# Patient Record
Sex: Female | Born: 1955 | Race: White | Hispanic: No | Marital: Married | Smoking: Former smoker
Health system: Southern US, Community
[De-identification: ages and names within clinical notes are randomized; demographics above are authoritative.]

## PROBLEM LIST (undated history)

## (undated) DIAGNOSIS — F419 Anxiety disorder, unspecified: Secondary | ICD-10-CM

## (undated) DIAGNOSIS — F32A Depression, unspecified: Secondary | ICD-10-CM

## (undated) DIAGNOSIS — M810 Age-related osteoporosis without current pathological fracture: Secondary | ICD-10-CM

## (undated) DIAGNOSIS — H269 Unspecified cataract: Secondary | ICD-10-CM

## (undated) HISTORY — DX: Unspecified cataract: H26.9

## (undated) HISTORY — PX: COSMETIC SURGERY: SHX468

## (undated) HISTORY — DX: Anxiety disorder, unspecified: F41.9

## (undated) HISTORY — DX: Depression, unspecified: F32.A

---

## 1997-01-24 HISTORY — PX: EYE SURGERY: SHX253

## 2001-01-24 HISTORY — PX: AUGMENTATION MAMMAPLASTY: SUR837

## 2007-12-05 DIAGNOSIS — Z87898 Personal history of other specified conditions: Secondary | ICD-10-CM | POA: Insufficient documentation

## 2007-12-05 DIAGNOSIS — Z7289 Other problems related to lifestyle: Secondary | ICD-10-CM | POA: Insufficient documentation

## 2007-12-05 LAB — HM PAP SMEAR: HM Pap smear: NEGATIVE

## 2009-03-09 DIAGNOSIS — R42 Dizziness and giddiness: Secondary | ICD-10-CM | POA: Insufficient documentation

## 2009-07-10 DIAGNOSIS — R0789 Other chest pain: Secondary | ICD-10-CM | POA: Insufficient documentation

## 2011-04-20 ENCOUNTER — Ambulatory Visit: Payer: Self-pay

## 2013-07-12 ENCOUNTER — Ambulatory Visit: Payer: Self-pay | Admitting: Family Medicine

## 2014-06-10 LAB — LIPID PANEL
CHOLESTEROL: 193 mg/dL (ref 0–200)
HDL: 69 mg/dL (ref 35–70)
LDL Cholesterol: 113 mg/dL
Triglycerides: 54 mg/dL (ref 40–160)

## 2014-06-10 LAB — CBC AND DIFFERENTIAL
HCT: 45 % (ref 36–46)
Hemoglobin: 14.9 g/dL (ref 12.0–16.0)
PLATELETS: 193 10*3/uL (ref 150–399)

## 2014-06-10 LAB — BASIC METABOLIC PANEL
BUN: 13 mg/dL (ref 4–21)
CREATININE: 0.8 mg/dL (ref 0.5–1.1)
Glucose: 82 mg/dL
Potassium: 4.3 mmol/L (ref 3.4–5.3)
SODIUM: 142 mmol/L (ref 137–147)

## 2014-06-10 LAB — HEPATIC FUNCTION PANEL
ALT: 20 U/L (ref 7–35)
AST: 24 U/L (ref 13–35)

## 2014-06-10 LAB — TSH: TSH: 1.84 u[IU]/mL (ref 0.41–5.90)

## 2014-08-07 ENCOUNTER — Other Ambulatory Visit: Payer: Self-pay | Admitting: Family Medicine

## 2014-08-07 DIAGNOSIS — F419 Anxiety disorder, unspecified: Secondary | ICD-10-CM

## 2015-01-25 HISTORY — PX: EYE SURGERY: SHX253

## 2015-02-15 ENCOUNTER — Other Ambulatory Visit: Payer: Self-pay | Admitting: Family Medicine

## 2015-02-15 DIAGNOSIS — F419 Anxiety disorder, unspecified: Secondary | ICD-10-CM

## 2015-02-16 DIAGNOSIS — F419 Anxiety disorder, unspecified: Secondary | ICD-10-CM | POA: Insufficient documentation

## 2015-02-16 NOTE — Telephone Encounter (Signed)
Last OV 05/2014.  Dr. Santiago Bur pt.   Thanks,   -Vernona Rieger

## 2015-05-14 DIAGNOSIS — E7800 Pure hypercholesterolemia, unspecified: Secondary | ICD-10-CM | POA: Insufficient documentation

## 2015-05-14 DIAGNOSIS — R238 Other skin changes: Secondary | ICD-10-CM | POA: Insufficient documentation

## 2015-05-14 DIAGNOSIS — R6882 Decreased libido: Secondary | ICD-10-CM | POA: Insufficient documentation

## 2015-05-14 DIAGNOSIS — R233 Spontaneous ecchymoses: Secondary | ICD-10-CM | POA: Insufficient documentation

## 2015-05-14 DIAGNOSIS — J309 Allergic rhinitis, unspecified: Secondary | ICD-10-CM | POA: Insufficient documentation

## 2015-05-14 DIAGNOSIS — H6980 Other specified disorders of Eustachian tube, unspecified ear: Secondary | ICD-10-CM | POA: Insufficient documentation

## 2015-05-14 DIAGNOSIS — R030 Elevated blood-pressure reading, without diagnosis of hypertension: Secondary | ICD-10-CM | POA: Insufficient documentation

## 2015-05-18 ENCOUNTER — Ambulatory Visit (INDEPENDENT_AMBULATORY_CARE_PROVIDER_SITE_OTHER): Payer: 59 | Admitting: Family Medicine

## 2015-05-18 ENCOUNTER — Encounter: Payer: Self-pay | Admitting: Family Medicine

## 2015-05-18 VITALS — BP 130/78 | HR 76 | Temp 97.8°F | Resp 16 | Ht 68.0 in | Wt 131.0 lb

## 2015-05-18 DIAGNOSIS — F419 Anxiety disorder, unspecified: Secondary | ICD-10-CM | POA: Diagnosis not present

## 2015-05-18 MED ORDER — ESCITALOPRAM OXALATE 20 MG PO TABS: 20.0000 mg | ORAL_TABLET | Freq: Every day | ORAL | Status: DC

## 2015-05-18 NOTE — Progress Notes (Signed)
Subjective:    Patient ID: Amber Hayes, female    DOB: 08/28/1955, 60 y.o.   MRN: [id number]  Anxiety Presents for follow-up visit. The problem has been gradually worsening. Symptoms include depressed mood (is able to "talk myself out of it"), dry mouth, feeling of choking, malaise, muscle tension (and headaches), nervous/anxious behavior and panic (rare). Patient reports no chest pain, compulsions, confusion, decreased concentration, dizziness, excessive worry, hyperventilation, insomnia, irritability, nausea, obsessions, palpitations, restlessness, shortness of breath or suicidal ideas. The severity of symptoms is moderate. Nothing aggravates the symptoms.   Treatments tried: currently taking Celexa 40 mg po qd. The treatment provided moderate relief. Compliance with prior treatments has been good.   Depression screen PHQ 2/9 05/18/2015  Decreased Interest 1  Down, Depressed, Hopeless 1  PHQ - 2 Score 2  Altered sleeping 0  Tired, decreased energy 3  Change in appetite 0  Feeling bad or failure about yourself  2  Trouble concentrating 0  Moving slowly or fidgety/restless 0  Suicidal thoughts 0  PHQ-9 Score 7  Difficult doing work/chores Somewhat difficult     Review of Systems  Constitutional: Negative for irritability.  Respiratory: Negative for shortness of breath.   Cardiovascular: Negative for chest pain and palpitations.  Gastrointestinal: Negative for nausea.  Neurological: Negative for dizziness.  Psychiatric/Behavioral: Negative for suicidal ideas, confusion and decreased concentration. The patient is nervous/anxious. The patient does not have insomnia.    BP 130/78 mmHg  Pulse 76  Temp(Src) 97.8 F (36.6 C) (Oral)  Resp 16  Ht 5\' 8"  (1.727 m)  Wt 131 lb (59.421 kg)  BMI 19.92 kg/m2   Patient Active Problem List   Diagnosis Date Noted  . Allergic rhinitis 05/14/2015  . Blood pressure elevated without history of HTN 05/14/2015  . Decreased libido 05/14/2015    . Abnormal bruising 05/14/2015  . Dysfunction of eustachian tube 05/14/2015  . Pure hypercholesterolemia 05/14/2015  . Anxiety 02/16/2015  . Atypical chest pain 07/10/2009  . Dizziness and giddiness 03/09/2009  . Alcohol use (HCC) 12/05/2007   No past medical history on file. Current Outpatient Prescriptions on File Prior to Visit  Medication Sig  . citalopram (CELEXA) 40 MG tablet TAKE ONE TABLET BY MOUTH ONCE DAILY   No current facility-administered medications on file prior to visit.   No Known Allergies Past Surgical History  Procedure Laterality Date  . Augmentation mammaplasty Bilateral 2003  . Eye surgery  1999   Social History   Social History  . Marital Status: Married    Spouse Name: N/A  . Number of Children: N/A  . Years of Education: N/A   Occupational History  . Not on file.   Social History Main Topics  . Smoking status: Former Smoker    Quit date: 01/24/2004  . Smokeless tobacco: Never Used  . Alcohol Use: No  . Drug Use: No  . Sexual Activity: Not on file   Other Topics Concern  . Not on file   Social History Narrative   Family History  Problem Relation Age of Onset  . Alzheimer's disease Mother   . Heart attack Father        Objective:   Physical Exam  Constitutional: She is oriented to person, place, and time. She appears well-developed and well-nourished.  Neurological: She is alert and oriented to person, place, and time.  Psychiatric: She has a normal mood and affect. Her behavior is normal. Judgment and thought content normal.   BP 130/78 mmHg  Pulse 76  Temp(Src) 97.8 F (36.6 C) (Oral)  Resp 16  Ht  (1.727 m)  Wt 131 lb (59.421 kg)  BMI 19.92 kg/m2      Assessment & Plan:  1. Acute anxiety Patient still not at goal.  Still with anxiety and throat tightening when thinks about her boys. Will try change to Lexapro and referral to Dr. Maryruth Bun to evaluate and treat.   - escitalopram (LEXAPRO) 20 MG tablet; Take 1 tablet  (20 mg total) by mouth daily.  Dispense: 30 tablet; Refill: 5 - Ambulatory referral to Psychiatry   Patient was seen and examined by Leo Grosser, MD, and note scribed by Allene Dillon, CMA. I have reviewed the document for accuracy and completeness and I agree with above. Leo Grosser, MD   Lorie Phenix, MD

## 2016-05-13 ENCOUNTER — Ambulatory Visit: Payer: Self-pay | Admitting: Family Medicine

## 2016-07-04 ENCOUNTER — Ambulatory Visit (INDEPENDENT_AMBULATORY_CARE_PROVIDER_SITE_OTHER): Payer: Self-pay | Admitting: Physician Assistant

## 2016-07-04 ENCOUNTER — Encounter: Payer: Self-pay | Admitting: Physician Assistant

## 2016-07-04 VITALS — BP 132/84 | HR 80 | Temp 97.7°F | Resp 16 | Ht 67.0 in | Wt 129.0 lb

## 2016-07-04 DIAGNOSIS — Z0000 Encounter for general adult medical examination without abnormal findings: Secondary | ICD-10-CM

## 2016-07-04 DIAGNOSIS — Z1231 Encounter for screening mammogram for malignant neoplasm of breast: Secondary | ICD-10-CM

## 2016-07-04 DIAGNOSIS — Z1211 Encounter for screening for malignant neoplasm of colon: Secondary | ICD-10-CM

## 2016-07-04 DIAGNOSIS — Z1239 Encounter for other screening for malignant neoplasm of breast: Secondary | ICD-10-CM

## 2016-07-04 DIAGNOSIS — Z131 Encounter for screening for diabetes mellitus: Secondary | ICD-10-CM

## 2016-07-04 DIAGNOSIS — Z124 Encounter for screening for malignant neoplasm of cervix: Secondary | ICD-10-CM

## 2016-07-04 DIAGNOSIS — Z1159 Encounter for screening for other viral diseases: Secondary | ICD-10-CM

## 2016-07-04 DIAGNOSIS — F419 Anxiety disorder, unspecified: Secondary | ICD-10-CM

## 2016-07-04 DIAGNOSIS — E7800 Pure hypercholesterolemia, unspecified: Secondary | ICD-10-CM

## 2016-07-04 NOTE — Patient Instructions (Signed)

## 2016-07-04 NOTE — Progress Notes (Signed)
Patient: Amber Hayes, Female    DOB: 05/14/1955, [age] y.o.   MRN: [id number] Visit Date: 07/04/2016  Today's Provider: Margaretann LovelessJennifer M Rozlyn Yerby, PA-C   Chief Complaint  Patient presents with  . Annual Exam   Subjective:    Annual physical exam Amber Hayes is a 61 y.o. female who presents today for health maintenance and complete physical. She feels well. She reports exercising daily for one hour; goes to gym, rides recumbent bike, etc. She reports she is sleeping poorly.  Last CPE- 06/09/2016 Last pap- 12/05/2007- Negative, HPV negative Last mammogram- 04/20/2011- BI-RADS 2 Last flexible sigmoidoscopy 09/24/2014- 10 years per pt. Internal hemorrhoids per pt. ----------------------------------------------------------------- Anxiety Dr. Maryruth BunKapur D/C pt's SSRI about 7 weeks ago. Pt reports none of the antidepressants she has tried were effective. Pt is exercising, deep breathing, and other remedies for anxiety. She states she is learning to cope with the anxiety. She states, "I don't think there is any pill that can magically take my anxiety away". Pt is c/o sleep disturbance, which is gradually improving. She does not want medication for insomnia, unless there is a natural remedy. She states her appetite is also increased. She denies weight gain. Pt states her anxiety causes a globus sensation.  Review of Systems  Constitutional: Positive for appetite change. Negative for activity change, chills, diaphoresis, fatigue, fever and unexpected weight change.  HENT: Positive for tinnitus. Negative for congestion, dental problem, drooling, ear discharge, ear pain, facial swelling, hearing loss, mouth sores, nosebleeds, postnasal drip, rhinorrhea, sinus pain, sinus pressure, sneezing, sore throat, trouble swallowing and voice change.   Eyes: Positive for visual disturbance. Negative for photophobia, pain, discharge, redness and itching.  Respiratory: Negative.   Cardiovascular: Negative.     Gastrointestinal: Negative.   Endocrine: Positive for polyphagia. Negative for cold intolerance, heat intolerance, polydipsia and polyuria.  Genitourinary: Negative.   Musculoskeletal: Positive for neck pain. Negative for arthralgias, back pain, gait problem, joint swelling, myalgias and neck stiffness.  Allergic/Immunologic: Negative.   Neurological: Positive for headaches. Negative for dizziness, tremors, seizures, syncope, facial asymmetry, speech difficulty, weakness, light-headedness and numbness.  Hematological: Negative for adenopathy. Bruises/bleeds easily.  Psychiatric/Behavioral: Positive for sleep disturbance. Negative for agitation, behavioral problems, confusion, decreased concentration, dysphoric mood, hallucinations, self-injury and suicidal ideas. The patient is nervous/anxious. The patient is not hyperactive.     Social History      She  reports that she quit smoking about 12 years ago. She has a 7.50 pack-year smoking history. She has never used smokeless tobacco. She reports that she does not drink alcohol or use drugs.       Social History   Social History  . Marital status: Married    Spouse name: Thayer OhmChris  . Number of children: 2  . Years of education: undergrad   Occupational History  . 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 Smoking status: Former Smoker    Packs/day: 0.50    Years: 15.00    Quit date: 01/24/2004  . Smokeless tobacco: Never Used  . Alcohol use No  . Drug use: No  . Sexual activity: Yes    Birth control/ protection: Post-menopausal   Other Topics Concern  . None   Social History Narrative  . None    History reviewed. No pertinent past medical history.   Patient Active Problem List   Diagnosis Date Noted  . Allergic rhinitis 05/14/2015  .  Blood pressure elevated without history of HTN 05/14/2015  . Decreased libido 05/14/2015  . Abnormal bruising  05/14/2015  . Dysfunction of eustachian tube 05/14/2015  . Pure hypercholesterolemia 05/14/2015  . Anxiety 02/16/2015  . Atypical chest pain 07/10/2009  . Alcohol use 12/05/2007    Past Surgical History:  Procedure Laterality Date  . AUGMENTATION MAMMAPLASTY Bilateral 2003  . EYE SURGERY  1999   Lasik  . EYE SURGERY  2017   Torn Retina    Family History        Family Status  Relation Status  . Mother Deceased at age 25  . Father Deceased at age 39       MI  . Sister Alive        Her family history includes Alzheimer's disease in her mother; Bronchitis in her sister; Heart attack in her father; Hypertension in her sister; Neurologic Disorder in her sister.     No Known Allergies   Current Outpatient Prescriptions:  .  Probiotic Product (PROBIOTIC DAILY PO), Take by mouth., Disp: , Rfl:  .  Protein POWD, Take by mouth., Disp: , Rfl:  .  UNABLE TO FIND, Med Name: OTC supplement that improves red blood cells ability to carry oxygen, Disp: , Rfl:    Patient Care Team: Lorie Phenix, MD as PCP - General (Family Medicine)      Objective:   Vitals: BP 132/84 (BP Location: Right Arm, Cuff Size: Normal)   Pulse 80   Temp 97.7 F (36.5 C) (Oral)   Resp 16   Ht 5\' 7"  (1.702 m)   Wt 129 lb (58.5 kg)   BMI 20.20 kg/m    Vitals:   07/04/16 1510 07/04/16 1532  BP: 126/88 132/84  Pulse: 80   Resp: 16   Temp: 97.7 F (36.5 C)   TempSrc: Oral   Weight: 129 lb (58.5 kg)   Height: 5\' 7"  (1.702 m)      Physical Exam  Constitutional: She is oriented to person, place, and time. She appears well-developed and well-nourished. No distress.  HENT:  Head: Normocephalic and atraumatic.  Right Ear: Hearing, tympanic membrane, external ear and ear canal normal.  Left Ear: Hearing, tympanic membrane, external ear and ear canal normal.  Nose: Nose normal.  Mouth/Throat: Uvula is midline, oropharynx is clear and moist and mucous membranes are normal. No oropharyngeal exudate.    Eyes: Conjunctivae and EOM are normal. Pupils are equal, round, and reactive to light. Right eye exhibits no discharge. Left eye exhibits no discharge. No scleral icterus.  Neck: Normal range of motion. Neck supple. No JVD present. Carotid bruit is not present. No tracheal deviation present. No thyromegaly present.  Cardiovascular: Normal rate, regular rhythm, normal heart sounds and intact distal pulses.  Exam reveals no gallop and no friction rub.   No murmur heard. Pulmonary/Chest: Effort normal and breath sounds normal. No respiratory distress. She has no wheezes. She has no rales. She exhibits no tenderness. Right breast exhibits no inverted nipple, no mass, no nipple discharge, no skin change and no tenderness. Left breast exhibits no inverted nipple, no mass, no nipple discharge, no skin change and no tenderness. Breasts are symmetrical.  Abdominal: Soft. Bowel sounds are normal. She exhibits no distension and no mass. There is no tenderness. There is no rebound and no guarding. Hernia confirmed negative in the right inguinal area and confirmed negative in the left inguinal area.  Genitourinary: Rectum normal, vagina normal and uterus normal. No breast swelling, tenderness,  discharge or bleeding. Pelvic exam was performed with patient supine. There is no rash, tenderness, lesion or injury on the right labia. There is no rash, tenderness, lesion or injury on the left labia. Cervix exhibits no motion tenderness, no discharge and no friability. Right adnexum displays no mass, no tenderness and no fullness. Left adnexum displays no mass, no tenderness and no fullness. No erythema, tenderness or bleeding in the vagina. No signs of injury around the vagina. No vaginal discharge found.  Musculoskeletal: Normal range of motion. She exhibits no edema or tenderness.  Lymphadenopathy:    She has no cervical adenopathy.       Right: No inguinal adenopathy present.       Left: No inguinal adenopathy present.   Neurological: She is alert and oriented to person, place, and time. She has normal reflexes. No cranial nerve deficit. Coordination normal.  Skin: Skin is warm and dry. No rash noted. She is not diaphoretic.  Psychiatric: She has a normal mood and affect. Her behavior is normal. Judgment and thought content normal.  Vitals reviewed.    Depression Screen PHQ 2/9 Scores 07/04/2016 05/18/2015  PHQ - 2 Score 0 2  PHQ- 9 Score 3 7      Assessment & Plan:     Routine Health Maintenance and Physical Exam  Exercise Activities and Dietary recommendations Goals    None      Immunization History  Administered Date(s) Administered  . Td 07/13/2013  . Tdap 07/13/2013    Health Maintenance  Topic Date Due  . Hepatitis C Screening  [birth year]  . HIV Screening  07/30/1968  . PAP SMEAR  07/31/1974  . MAMMOGRAM  07/31/2003  . COLONOSCOPY  07/31/2003  . INFLUENZA VACCINE  08/24/2016  . TETANUS/TDAP  07/14/2023     Discussed health benefits of physical activity, and encouraged her to engage in regular exercise appropriate for her age and condition.    1. Annual physical exam Normal physical exam today. Will check labs as below and f/u pending lab results. If labs are stable and WNL she will not need to have these rechecked for one year at her next annual physical exam. She is to call the office in the meantime if she has any acute issue, questions or concerns. - CBC with Differential - Comprehensive metabolic panel - TSH  2. Breast cancer screening Breast exam was normal. Patient does perform self breast exams. Patient does not want to have mammogram at this time.  3. Cervical cancer screening Pap collected today. Will send as below and f/u pending results. - Pap IG and HPV (high risk) DNA detection ([facility])  4. Colon cancer screening Referral placed to gastroenterology for colonoscopy.  - Ambulatory referral to Gastroenterology  5. Pure  hypercholesterolemia Stable. Will check labs as below and f/u pending results. - Lipid panel  6. Diabetes mellitus screening Will check labs as below and f/u pending results. - Hemoglobin A1c  7. Need for hepatitis C screening test - Hepatitis C antibody screen  8. Anxiety Patient feels she is doing well with self coping techniques. She is requesting to have cortisol checked to make sure this is not a source for her anxiety. I will f/u pending results.  - Cortisol  --------------------------------------------------------------------    Margaretann Loveless, PA-C  [facility] [place]

## 2016-07-06 ENCOUNTER — Telehealth: Payer: Self-pay

## 2016-07-06 LAB — CBC WITH DIFFERENTIAL/PLATELET
BASOS: 1 %
Basophils Absolute: 0 10*3/uL (ref 0.0–0.2)
EOS (ABSOLUTE): 0 10*3/uL (ref 0.0–0.4)
EOS: 1 %
HEMATOCRIT: 37.2 % (ref 34.0–46.6)
HEMOGLOBIN: 11.2 g/dL (ref 11.1–15.9)
Immature Grans (Abs): 0 10*3/uL (ref 0.0–0.1)
Immature Granulocytes: 0 %
LYMPHS ABS: 1.4 10*3/uL (ref 0.7–3.1)
Lymphs: 31 %
MCH: 22.4 pg — AB (ref 26.6–33.0)
MCHC: 30.1 g/dL — AB (ref 31.5–35.7)
MCV: 74 fL — AB (ref 79–97)
MONOCYTES: 7 %
MONOS ABS: 0.3 10*3/uL (ref 0.1–0.9)
NEUTROS ABS: 2.6 10*3/uL (ref 1.4–7.0)
Neutrophils: 60 %
Platelets: 235 10*3/uL (ref 150–379)
RBC: 5.01 x10E6/uL (ref 3.77–5.28)
RDW: 18.1 % — AB (ref 12.3–15.4)
WBC: 4.4 10*3/uL (ref 3.4–10.8)

## 2016-07-06 LAB — COMPREHENSIVE METABOLIC PANEL
A/G RATIO: 1.9 (ref 1.2–2.2)
ALBUMIN: 4.3 g/dL (ref 3.6–4.8)
ALK PHOS: 88 IU/L (ref 39–117)
ALT: 14 IU/L (ref 0–32)
AST: 19 IU/L (ref 0–40)
BUN / CREAT RATIO: 22 (ref 12–28)
BUN: 18 mg/dL (ref 8–27)
Bilirubin Total: 0.5 mg/dL (ref 0.0–1.2)
CO2: 24 mmol/L (ref 20–29)
CREATININE: 0.81 mg/dL (ref 0.57–1.00)
Calcium: 9.2 mg/dL (ref 8.7–10.3)
Chloride: 105 mmol/L (ref 96–106)
GFR calc Af Amer: 90 mL/min/{1.73_m2} (ref >59)
GFR, EST NON AFRICAN AMERICAN: 78 mL/min/{1.73_m2} (ref >59)
GLOBULIN, TOTAL: 2.3 g/dL (ref 1.5–4.5)
Glucose: 80 mg/dL (ref 65–99)
POTASSIUM: 4.4 mmol/L (ref 3.5–5.2)
SODIUM: 141 mmol/L (ref 134–144)
Total Protein: 6.6 g/dL (ref 6.0–8.5)

## 2016-07-06 LAB — TSH: TSH: 2.19 u[IU]/mL (ref 0.450–4.500)

## 2016-07-06 LAB — HEMOGLOBIN A1C
ESTIMATED AVERAGE GLUCOSE: 108 mg/dL
HEMOGLOBIN A1C: 5.4 % (ref 4.8–5.6)

## 2016-07-06 LAB — LIPID PANEL
CHOL/HDL RATIO: 2.6 ratio (ref 0.0–4.4)
Cholesterol, Total: 211 mg/dL — ABNORMAL HIGH (ref 100–199)
HDL: 80 mg/dL (ref >39)
LDL Calculated: 121 mg/dL — ABNORMAL HIGH (ref 0–99)
Triglycerides: 48 mg/dL (ref 0–149)
VLDL CHOLESTEROL CAL: 10 mg/dL (ref 5–40)

## 2016-07-06 LAB — HEPATITIS C ANTIBODY

## 2016-07-06 LAB — PAP IG AND HPV HIGH-RISK
HPV, HIGH-RISK: NEGATIVE
PAP Smear Comment: 0

## 2016-07-06 LAB — CORTISOL: Cortisol: 14.8 ug/dL

## 2016-07-06 NOTE — Telephone Encounter (Signed)
-----   Message from Margaretann LovelessJennifer M Burnette, [place]PA-C sent at 07/06/2016 12:57 PM EDT ----- Pap is also negative and HPV negative. Can repeat in 3-5 years if patient desires.

## 2016-07-06 NOTE — Telephone Encounter (Signed)
Patient advised as below.  

## 2016-11-11 ENCOUNTER — Encounter: Payer: Self-pay | Admitting: Adult Health

## 2016-11-11 ENCOUNTER — Ambulatory Visit: Payer: Self-pay | Admitting: Adult Health

## 2016-11-11 VITALS — BP 130/78 | HR 72 | Temp 97.5°F | Resp 16 | Wt 128.0 lb

## 2016-11-11 DIAGNOSIS — R3989 Other symptoms and signs involving the genitourinary system: Secondary | ICD-10-CM

## 2016-11-11 DIAGNOSIS — R350 Frequency of micturition: Secondary | ICD-10-CM

## 2016-11-11 LAB — POCT URINALYSIS DIPSTICK
Bilirubin, UA: NEGATIVE
GLUCOSE UA: NEGATIVE
KETONES UA: NEGATIVE
Leukocytes, UA: NEGATIVE
Nitrite, UA: NEGATIVE
Protein, UA: NEGATIVE
RBC UA: NEGATIVE
SPEC GRAV UA: 1.01 (ref 1.010–1.025)
Urobilinogen, UA: 0.2 E.U./dL
pH, UA: 6 (ref 5.0–8.0)

## 2016-11-11 MED ORDER — SULFAMETHOXAZOLE-TRIMETHOPRIM 400-80 MG PO TABS: 1.0000 | ORAL_TABLET | Freq: Two times a day (BID) | ORAL | 0 refills | Status: DC

## 2016-11-11 NOTE — Progress Notes (Addendum)
Subjective:     Patient ID: Amber Hayes, female   DOB: [birth year], 61 y.o.   MRN: [id number]  HPI  Patient is a 61 year old female in no acute distress who reports urinary frequency and pressure with urinating since 11/07/16.  She also reports she has started a miracle tea in the past two weeks.  She has done this detox tea.  This tea has a mild laxative, Persimmon  leaves, marshmallow root, Holy Thistle and Blessed thistle leafs and chamomile- she thinks possibly this could be causing her symptoms.   Mild fatigue she reports for last two days.  She also reports new sleep pattern as well that could be affecting- she is getting up at 4am.  Normal stools though she reports they are lose at times with probiotics and teas.   Menopausal " for years"   Vitals:   11/11/16 0753  BP: 130/78  Pulse: 72  Resp: 16  Temp: (!) 97.5 F (36.4 C)  SpO2: 96%   Recheck 98.0 oral 99 % Oxygen saturation recheck   No Known Allergies   Current Outpatient Prescriptions:  .  Probiotic Product (PROBIOTIC DAILY PO), Take by mouth., Disp: , Rfl:  .  Protein POWD, Take by mouth., Disp: , Rfl:  .  UNABLE TO FIND, Med Name: OTC supplement that improves red blood cells ability to carry oxygen, Disp: , Rfl:   Burnette, Alessandra Bevels, PA-C PCP she has annual female exam- she reports PAP/GYN summer was normal this year. Blood work was normal per patient.    Review of Systems  Constitutional: Positive for fatigue. Negative for activity change, appetite change, chills, diaphoresis, fever and unexpected weight change.  HENT: Negative.   Eyes: Negative.   Respiratory: Negative for apnea, cough, choking, chest tightness, shortness of breath, wheezing and stridor.   Cardiovascular: Negative for chest pain, palpitations and leg swelling.  Gastrointestinal: Positive for abdominal distention (mild chronic for years  per her report ). Negative for abdominal pain, anal bleeding, blood in stool, constipation, diarrhea,  nausea, rectal pain and vomiting.  Endocrine: Negative.   Genitourinary: Positive for urgency. Negative for decreased urine volume, difficulty urinating, dyspareunia, dysuria, enuresis, flank pain, frequency, genital sores, hematuria, menstrual problem, pelvic pain, vaginal bleeding, vaginal discharge and vaginal pain.  Musculoskeletal: Positive for back pain (lower back aches last two days she has also been new back sitting to desk and started elons  excercise classes ). Negative for arthralgias, gait problem, joint swelling, myalgias and neck pain.  Skin: Negative.   Allergic/Immunologic: Negative.   Neurological: Negative.   Hematological: Negative.   Psychiatric/Behavioral: Negative.        Objective:   Physical Exam  Constitutional: She is oriented to person, place, and time. She appears well-developed and well-nourished. No distress. She is not intubated.  HENT:  Head: Normocephalic.  Mouth/Throat: Oropharynx is clear and moist. No oropharyngeal exudate.  Eyes: Pupils are equal, round, and reactive to light. Conjunctivae and EOM are normal.  Neck: Normal range of motion. No JVD present. No tracheal deviation present. No thyromegaly present.  Cardiovascular: Normal rate, regular rhythm, normal heart sounds and intact distal pulses.  Exam reveals no gallop and no friction rub.   No murmur heard. Pulmonary/Chest: Effort normal and breath sounds normal. No stridor. No apnea, no tachypnea and no bradypnea. She is not intubated. No respiratory distress. She has no wheezes. She has no rales. She exhibits no tenderness.  Abdominal: Soft. Normal aorta and bowel sounds are normal. She  exhibits no shifting dullness, no distension, no pulsatile liver, no fluid wave, no abdominal bruit, no ascites, no pulsatile midline mass and no mass. There is no hepatosplenomegaly, splenomegaly or hepatomegaly. There is tenderness in the suprapubic area. There is no rigidity, no rebound, no guarding, no CVA  tenderness, no tenderness at McBurney's point and negative Murphy's sign.    Area of mild tenderness documented on diagram suprapubic. Otherwise normal abdominal exam.   Genitourinary:  Genitourinary Comments: No gynecology exams done in this office at this time/ patient is aware she will have to see gynecology if needed.   Musculoskeletal: Normal range of motion. She exhibits no edema, tenderness or deformity.  Lymphadenopathy:    She has no cervical adenopathy.  Neurological: She is alert and oriented to person, place, and time. She has normal reflexes.  Patient moves on and off of exam table and in room without difficulty. Gait is normal in hall and in room. Patient is oriented to person place time and situation. Patient answers questions appropriately and engages in conversation..   Skin: Skin is warm and dry. No rash noted. She is not diaphoretic. No erythema. No pallor.  Psychiatric: She has a normal mood and affect. Her speech is normal and behavior is normal. Judgment and thought content normal. Cognition and memory are normal.  Vitals reviewed.      Assessment:     Urine troubles - Plan: POCT urinalysis dipstick, CBC w/Diff, Comprehensive metabolic panel, CULTURE, URINE COMPREHENSIVE  Urinary frequency  Will treat for possible Urinary Tract Infection due to symptoms. Will culture and call with results.  Also discussed possibility of tea causing urgency.     Plan:   Meds ordered this encounter  Medications  . sulfamethoxazole-trimethoprim (BACTRIM) 400-80 MG tablet    Sig: Take 1 tablet by mouth 2 (two) times daily.    Dispense:  14 tablet    Refill:  0  E- Prescribed above.   Orders Placed This Encounter  Procedures  . CULTURE, URINE COMPREHENSIVE  . CBC w/Diff  . Comprehensive metabolic panel  . POCT urinalysis dipstick   Will treat due to symptoms and culture urine.  Also discussed the possibility that this new tea could be causing symptoms and stopping it,  Marshmallow root  Has the potential to act as a diuretic. Other herbs in tea also have potential for laxative properties. Advised these herbs have not been approved by the FDA and not controlled studies to recommend use.  If any pelvic tenderness occurs or worsens she is advised to see Margaretann LovelessBurnette, Jennifer M, PA-C  PCP for pelvic exam and evaluation.   Return to clinic at any time  if any new symptoms change, worsen or do not improve. Symptoms should improve  within 72 hours and if not improving you should call for an appointment at the clinic or be seen in urgent care/ED if clinic is closed. Your symptoms should not get worse from this point forward and if they do seek immediate medical attention.  Patient verbalized understanding of instructions and denies any further questions at this time.

## 2016-11-11 NOTE — Patient Instructions (Signed)

## 2016-11-12 LAB — CBC WITH DIFFERENTIAL/PLATELET
Basophils Absolute: 0.1 10*3/uL (ref 0.0–0.2)
Basos: 1 %
EOS (ABSOLUTE): 0 10*3/uL (ref 0.0–0.4)
Eos: 1 %
Hematocrit: 41.2 % (ref 34.0–46.6)
Hemoglobin: 13.1 g/dL (ref 11.1–15.9)
IMMATURE GRANS (ABS): 0 10*3/uL (ref 0.0–0.1)
Immature Granulocytes: 0 %
LYMPHS: 21 %
Lymphocytes Absolute: 1.1 10*3/uL (ref 0.7–3.1)
MCH: 26.3 pg — AB (ref 26.6–33.0)
MCHC: 31.8 g/dL (ref 31.5–35.7)
MCV: 83 fL (ref 79–97)
Monocytes Absolute: 0.5 10*3/uL (ref 0.1–0.9)
Monocytes: 10 %
NEUTROS ABS: 3.5 10*3/uL (ref 1.4–7.0)
Neutrophils: 67 %
Platelets: 199 10*3/uL (ref 150–379)
RBC: 4.98 x10E6/uL (ref 3.77–5.28)
RDW: 17 % — ABNORMAL HIGH (ref 12.3–15.4)
WBC: 5.2 10*3/uL (ref 3.4–10.8)

## 2016-11-12 LAB — COMPREHENSIVE METABOLIC PANEL
ALK PHOS: 92 IU/L (ref 39–117)
ALT: 15 IU/L (ref 0–32)
AST: 25 IU/L (ref 0–40)
Albumin/Globulin Ratio: 1.9 (ref 1.2–2.2)
Albumin: 4.2 g/dL (ref 3.6–4.8)
BILIRUBIN TOTAL: 0.4 mg/dL (ref 0.0–1.2)
BUN/Creatinine Ratio: 12 (ref 12–28)
BUN: 9 mg/dL (ref 8–27)
CHLORIDE: 102 mmol/L (ref 96–106)
CO2: 25 mmol/L (ref 20–29)
Calcium: 9.1 mg/dL (ref 8.7–10.3)
Creatinine, Ser: 0.75 mg/dL (ref 0.57–1.00)
GFR calc Af Amer: 98 mL/min/{1.73_m2} (ref >59)
GFR calc non Af Amer: 85 mL/min/{1.73_m2} (ref >59)
GLUCOSE: 76 mg/dL (ref 65–99)
Globulin, Total: 2.2 g/dL (ref 1.5–4.5)
Potassium: 3.7 mmol/L (ref 3.5–5.2)
Sodium: 142 mmol/L (ref 134–144)
TOTAL PROTEIN: 6.4 g/dL (ref 6.0–8.5)

## 2016-11-14 LAB — CULTURE, URINE COMPREHENSIVE

## 2016-11-17 ENCOUNTER — Telehealth: Payer: Self-pay | Admitting: Adult Health

## 2016-11-17 NOTE — Telephone Encounter (Signed)
Recent Results (from the past 2160 hour(s))  POCT urinalysis dipstick     Status: Normal   Collection Time: 11/11/16  8:14 AM  Result Value Ref Range   Color, UA yellow    Clarity, UA clear    Glucose, UA neg    Bilirubin, UA neg    Ketones, UA neg    Spec Grav, UA 1.010 1.010 - 1.025   Blood, UA neg    pH, UA 6.0 5.0 - 8.0   Protein, UA neg    Urobilinogen, UA 0.2 0.2 or 1.0 E.U./dL   Nitrite, UA neg    Leukocytes, UA Negative Negative  CBC w/Diff     Status: Abnormal   Collection Time: 11/11/16  8:58 AM  Result Value Ref Range   WBC 5.2 3.4 - 10.8 x10E3/uL   RBC 4.98 3.77 - 5.28 x10E6/uL   Hemoglobin 13.1 11.1 - 15.9 g/dL   Hematocrit 08.6 57.8 - 46.6 %   MCV 83 79 - 97 fL   MCH 26.3 (L) 26.6 - 33.0 pg   MCHC 31.8 31.5 - 35.7 g/dL   RDW 46.9 (H) 62.9 - 52.8 %   Platelets 199 150 - 379 x10E3/uL   Neutrophils 67 Not Estab. %   Lymphs 21 Not Estab. %   Monocytes 10 Not Estab. %   Eos 1 Not Estab. %   Basos 1 Not Estab. %   Neutrophils Absolute 3.5 1.4 - 7.0 x10E3/uL   Lymphocytes Absolute 1.1 0.7 - 3.1 x10E3/uL   Monocytes Absolute 0.5 0.1 - 0.9 x10E3/uL   EOS (ABSOLUTE) 0.0 0.0 - 0.4 x10E3/uL   Basophils Absolute 0.1 0.0 - 0.2 x10E3/uL   Immature Granulocytes 0 Not Estab. %   Immature Grans (Abs) 0.0 0.0 - 0.1 x10E3/uL  Comprehensive metabolic panel     Status: None   Collection Time: 11/11/16  8:58 AM  Result Value Ref Range   Glucose 76 65 - 99 mg/dL   BUN 9 8 - 27 mg/dL   Creatinine, Ser 4.13 0.57 - 1.00 mg/dL   GFR calc non Af Amer 85 >59 mL/min/1.73   GFR calc Af Amer 98 >59 mL/min/1.73   BUN/Creatinine Ratio 12 12 - 28   Sodium 142 134 - 144 mmol/L   Potassium 3.7 3.5 - 5.2 mmol/L   Chloride 102 96 - 106 mmol/L   CO2 25 20 - 29 mmol/L   Calcium 9.1 8.7 - 10.3 mg/dL   Total Protein 6.4 6.0 - 8.5 g/dL   Albumin 4.2 3.6 - 4.8 g/dL   Globulin, Total 2.2 1.5 - 4.5 g/dL   Albumin/Globulin Ratio 1.9 1.2 - 2.2   Bilirubin Total 0.4 0.0 - 1.2 mg/dL   Alkaline  Phosphatase 92 39 - 117 IU/L   AST 25 0 - 40 IU/L   ALT 15 0 - 32 IU/L  CULTURE, URINE COMPREHENSIVE     Status: None   Collection Time: 11/11/16  9:19 AM  Result Value Ref Range   Urine Culture, Comprehensive Final report    Organism ID, Bacteria Comment     Comment: Mixed urogenital flora 1,000 Colonies/mL      Called patient to discuss lab results. Labs have improved since seeing her PCP, she reports she is on a multivitamin, she denies any rectal or vaginal bleeding. Discussed that her Hemoglobin has improved since labs were done with Margaretann Loveless, PA-C 4 months ago. She is advised to follow up with Margaretann Loveless, PA-C for evaluation and recheck  of labs in 4 to 6 months.  Urine culture discussed as well, Mixed Urogenital Flora less than 1,000 colonies.  She is currently on Bactrim and will continue until finished.  She reports she is feeling some better since the office visit on 11/11/16 though she has continued urinary frequency intermittently. She reports symptoms are not worse. She also stopped the herbal tea she was taking.  She denies any hematuria, burning with urination, or any pain.She denies any fever,  rash, chest pain, shortness of breath, nausea, vomiting, or diarrhea  Offered appointment for recheck in clinic, she reports she is a self pay for labs here  and she reports she will follow up with Margaretann LovelessBurnette, Jennifer M, PA-C for evaluation. Advised patient to call today and that she will need to discuss urology referral with her primary care doctor as well.  She should be seen in urgent care or emergency room if any symptoms change or worsen.   Return to clinic at any time  if any new symptoms change, worsen or do not improve. Symptoms should improve  within 72 hours and if not improving you should call for an appointment at the clinic or be seen in urgent care/ED if clinic is closed. Your symptoms should not get worse from this point forward and if they do seek  immediate medical attention.  Patient verbalized understanding of instructions and denies any further questions at this time.

## 2016-11-18 ENCOUNTER — Encounter: Payer: Self-pay | Admitting: Family Medicine

## 2016-11-18 ENCOUNTER — Ambulatory Visit (INDEPENDENT_AMBULATORY_CARE_PROVIDER_SITE_OTHER): Payer: Self-pay | Admitting: Family Medicine

## 2016-11-18 VITALS — BP 128/72 | HR 72 | Temp 98.1°F | Resp 16 | Wt 129.0 lb

## 2016-11-18 DIAGNOSIS — N952 Postmenopausal atrophic vaginitis: Secondary | ICD-10-CM

## 2016-11-18 DIAGNOSIS — R350 Frequency of micturition: Secondary | ICD-10-CM

## 2016-11-18 MED ORDER — ESTRADIOL 0.1 MG/GM VA CREA: 1.0000 | TOPICAL_CREAM | Freq: Every day | VAGINAL | 12 refills | Status: DC

## 2016-11-18 NOTE — Assessment & Plan Note (Signed)
Given constellation of symptoms in postmenopausal woman, suspect vaginal atrophy is causing urinary symptoms in setting of normal urine culture Treat with topical Estrace cream Given this is low-dose estrogen, no opposing progesterone necessary at this time F/u in 1 month and complete pelvic exam

## 2016-11-18 NOTE — Patient Instructions (Addendum)
Atrophic Vaginitis Atrophic vaginitis is when the tissues that line the vagina become dry and thin. This is caused by a drop in estrogen. Estrogen helps:  To keep the vagina moist.  To make a clear fluid that helps: ? To lubricate the vagina for sex. ? To protect the vagina from infection.  If the lining of the vagina is dry and thin, it may:  Make sex painful. It may also cause bleeding.  Cause a feeling of: ? Burning. ? Irritation. ? Itchiness.  Make an exam of your vagina painful. It may also cause bleeding.  Make you lose interest in sex.  Cause a burning feeling when you pee.  Make your vaginal fluid (discharge) brown or yellow.  For some women, there are no symptoms. This condition is most common in women who do not get their regular menstrual periods anymore (menopause). This often starts when a woman is 45-55 years old. Follow these instructions at home:  Take medicines only as told by your doctor. Do not use any herbal or alternative medicines unless your doctor says it is okay.  Use over-the-counter products for dryness only as told by your doctor. These include: ? Creams. ? Lubricants. ? Moisturizers.  Do not douche.  Do not use products that can make your vagina dry. These include: ? Scented feminine sprays. ? Scented tampons. ? Scented soaps.  If it hurts to have sex, tell your sexual partner. Contact a doctor if:  Your discharge looks different than normal.  Your vagina has an unusual smell.  You have new symptoms.  Your symptoms do not get better with treatment.  Your symptoms get worse. This information is not intended to replace advice given to you by your health care provider. Make sure you discuss any questions you have with your health care provider. Document Released: 06/29/2007 Document Revised: 06/18/2015 Document Reviewed: 01/01/2014 Elsevier Interactive Patient Education  2018 Elsevier Inc.  

## 2016-11-18 NOTE — Progress Notes (Signed)
Patient: Amber Hayes Female    DOB: November 25, 1955   61 y.o.   MRN: [id number] Visit Date: 11/18/2016  Today's Provider: Shirlee Latch, MD   Chief Complaint  Patient presents with  . Urinary Frequency   Subjective:    Urinary Frequency   This is a new problem. The current episode started in the past 7 days. The problem has been unchanged. The patient is experiencing no pain. There has been no fever. Associated symptoms include frequency and nausea.   Patient reports that she was seen at the clinic at [facility] and was prescribed Bactrim. She reports that she took her last dose this morning. She reports that she still has the urgency to urinate after voiding. She denies any blood in her urine.   She reports that chronic constipation is well controlled currently.  She does endorse vaginal dryness, dyspareunia,  And pressure feeling in her bladder.    No Known Allergies   Current Outpatient Prescriptions:  .  estradiol (ESTRACE VAGINAL) 0.1 MG/GM vaginal cream, Place 1 Applicatorful vaginally at bedtime. Then decrease to 1/2 applicator qhs after 1 week, after 1 week decrease to 3 times weekly, Disp: 42.5 g, Rfl: 12 .  Probiotic Product (PROBIOTIC DAILY PO), Take by mouth., Disp: , Rfl:  .  Protein POWD, Take by mouth., Disp: , Rfl:  .  sulfamethoxazole-trimethoprim (BACTRIM) 400-80 MG tablet, Take 1 tablet by mouth 2 (two) times daily., Disp: 14 tablet, Rfl: 0 .  UNABLE TO FIND, Med Name: OTC supplement that improves red blood cells ability to carry oxygen, Disp: , Rfl:   Review of Systems  Gastrointestinal: Positive for nausea.  Genitourinary: Positive for frequency.    Social History  Substance Use Topics  . Smoking status: Former Smoker    Packs/day: 0.50    Years: 15.00    Quit date: 01/24/2004  . Smokeless tobacco: Never Used  . Alcohol use No   Objective:   BP 128/72 (BP Location: Right Arm, Patient Position: Sitting, Cuff Size: Normal)   Pulse 72   Temp 98.1 F  (36.7 C)   Resp 16   Wt 129 lb (58.5 kg)   BMI 20.20 kg/m  Vitals:   11/18/16 1011  BP: 128/72  Pulse: 72  Resp: 16  Temp: 98.1 F (36.7 C)  Weight: 129 lb (58.5 kg)     Physical Exam  Constitutional: She appears well-developed and well-nourished. No distress.  HENT:  Head: Normocephalic and atraumatic.  Cardiovascular: Normal rate, regular rhythm and normal heart sounds.   No murmur heard. Pulmonary/Chest: Effort normal. No respiratory distress. She has no wheezes.  Abdominal: Soft. She exhibits no distension. There is no tenderness. There is no rebound and no guarding.  Genitourinary:  Genitourinary Comments: Patient declines pelvic exam today  Musculoskeletal: She exhibits no edema.  Neurological: She is alert.  Psychiatric: She has a normal mood and affect. Her behavior is normal.  Vitals reviewed.    POCT urinalysis dipstick  Result Value Ref Range   Color, UA yellow    Clarity, UA clear    Glucose, UA neg    Bilirubin, UA neg    Ketones, UA neg    Spec Grav, UA 1.010 1.010 - 1.025   Blood, UA neg    pH, UA 6.0 5.0 - 8.0   Protein, UA neg    Urobilinogen, UA 0.2 0.2 or 1.0 E.U./dL   Nitrite, UA neg    Leukocytes, UA Negative Negative  Assessment & Plan:      Problem List Items Addressed This Visit      Genitourinary   Vaginal atrophy - Primary    Given constellation of symptoms in postmenopausal woman, suspect vaginal atrophy is causing urinary symptoms in setting of normal urine culture Treat with topical Estrace cream Given this is low-dose estrogen, no opposing progesterone necessary at this time F/u in 1 month and complete pelvic exam       Other Visit Diagnoses    Urine frequency          Return in about 4 weeks (around 12/16/2016).     The entirety of the information documented in the History of Present Illness, Review of Systems and Physical Exam were personally obtained by me. Portions of this information were initially  documented by Anson Oregonachelle Presley, CMA and reviewed by me for thoroughness and accuracy.     Shirlee LatchAngela Alliyah Roesler, MD  [facility]

## 2016-11-24 ENCOUNTER — Encounter: Payer: Self-pay | Admitting: Family Medicine

## 2016-12-11 ENCOUNTER — Encounter: Payer: Self-pay | Admitting: Family Medicine

## 2016-12-16 ENCOUNTER — Encounter: Payer: Self-pay | Admitting: Family Medicine

## 2016-12-23 ENCOUNTER — Ambulatory Visit: Payer: Self-pay | Admitting: Family Medicine

## 2016-12-28 ENCOUNTER — Ambulatory Visit (INDEPENDENT_AMBULATORY_CARE_PROVIDER_SITE_OTHER): Payer: Self-pay | Admitting: Family Medicine

## 2016-12-28 VITALS — BP 128/84 | HR 64 | Temp 97.5°F | Resp 16 | Wt 131.0 lb

## 2016-12-28 DIAGNOSIS — N8110 Cystocele, unspecified: Secondary | ICD-10-CM

## 2016-12-28 DIAGNOSIS — R350 Frequency of micturition: Secondary | ICD-10-CM | POA: Insufficient documentation

## 2016-12-28 DIAGNOSIS — N952 Postmenopausal atrophic vaginitis: Secondary | ICD-10-CM

## 2016-12-28 LAB — POCT URINALYSIS DIPSTICK
Bilirubin, UA: NEGATIVE
Blood, UA: NEGATIVE
GLUCOSE UA: NEGATIVE
Ketones, UA: NEGATIVE
LEUKOCYTES UA: NEGATIVE
Nitrite, UA: NEGATIVE
Protein, UA: NEGATIVE
Spec Grav, UA: <=1.005 — AB (ref 1.010–1.025)
UROBILINOGEN UA: 0.2 U/dL
pH, UA: 6 (ref 5.0–8.0)

## 2016-12-28 MED ORDER — ESTRADIOL 0.1 MG/GM VA CREA: 0.5000 | TOPICAL_CREAM | VAGINAL | 12 refills | Status: DC

## 2016-12-28 NOTE — Progress Notes (Signed)
Patient: Amber Hayes Female    DOB: 06/17/1955   61 y.o.   MRN: [id number] Visit Date: 12/28/2016  Today's Provider: Shirlee LatchAngela Bacigalupo, MD   Chief Complaint  Patient presents with  . Vaginal Atrophy   Subjective:    HPI     Follow up for Vaginal Atrophy  The patient was last seen for this 4 weeks ago. Changes made at last visit include adding Estrace Cream, which caused vaginal discomfort for the pt.  She was using it daily x1 wk and started to feel a fullness and weight in her vagina.  She denies current bleeding/discharge.  She feels that condition is Unchanged. She is still c/o urinary frequency.  She denies dysuria, fevers, abd pain, incontinence, hematuria.   ------------------------------------------------------------------------------------    No Known Allergies   Current Outpatient Medications:  .  Probiotic Product (PROBIOTIC DAILY PO), Take by mouth., Disp: , Rfl:  .  UNABLE TO FIND, Med Name: OTC supplement that improves red blood cells ability to carry oxygen, Disp: , Rfl:   Review of Systems  Constitutional: Negative for activity change, appetite change, chills, diaphoresis, fatigue, fever and unexpected weight change.  Respiratory: Negative.   Cardiovascular: Negative.   Gastrointestinal: Negative.   Genitourinary: Positive for dyspareunia and frequency. Negative for decreased urine volume, difficulty urinating, dysuria, flank pain, hematuria, urgency, vaginal bleeding, vaginal discharge and vaginal pain.  Neurological: Negative.   Psychiatric/Behavioral: Negative.     Social History   Tobacco Use  . Smoking status: Former Smoker    Packs/day: 0.50    Years: 15.00    Pack years: 7.50    Last attempt to quit: 01/24/2004    Years since quitting: 12.9  . Smokeless tobacco: Never Used  Substance Use Topics  . Alcohol use: No   Objective:   BP 128/84 (BP Location: Left Arm, Patient Position: Sitting, Cuff Size: Normal)   Pulse 64   Temp  (!) 97.5 F (36.4 C) (Oral)   Resp 16   Wt 131 lb (59.4 kg)   BMI 20.52 kg/m  Vitals:   12/28/16 1123  BP: 128/84  Pulse: 64  Resp: 16  Temp: (!) 97.5 F (36.4 C)  TempSrc: Oral  Weight: 131 lb (59.4 kg)     Physical Exam  Constitutional: She is oriented to person, place, and time. She appears well-developed and well-nourished. No distress.  Cardiovascular: Normal rate, regular rhythm, normal heart sounds and intact distal pulses.  No murmur heard. Pulmonary/Chest: Effort normal and breath sounds normal. No respiratory distress. She has no wheezes. She has no rales.  Abdominal: Soft. Bowel sounds are normal. She exhibits no distension. There is no tenderness. There is no rebound and no guarding.  Genitourinary:  Genitourinary Comments: GYN:  External genitalia within normal limits.  Vaginal mucosa pink, moist, with no rugae.  Nonfriable cervix without lesions, no discharge or bleeding noted on speculum exam.  +cystocele, no rectocele, no uterine prolapse.  Bimanual exam revealed normal, nongravid uterus.  No cervical motion tenderness. No adnexal masses bilaterally.    Musculoskeletal: She exhibits no edema or deformity.  Neurological: She is alert and oriented to person, place, and time.  Skin: Skin is warm and dry. No rash noted.  Psychiatric: She has a normal mood and affect. Her behavior is normal.  Vitals reviewed.   Results for orders placed or performed in visit on 12/28/16  POCT urinalysis dipstick  Result Value Ref Range   Color, UA light yellow  Clarity, UA clear    Glucose, UA Negative    Bilirubin, UA Negative    Ketones, UA Negative    Spec Grav, UA <=1.005 (A) 1.010 - 1.025   Blood, UA Negative    pH, UA 6.0 5.0 - 8.0   Protein, UA Negative    Urobilinogen, UA 0.2 0.2 or 1.0 E.U./dL   Nitrite, UA Negative    Leukocytes, UA Negative Negative       Assessment & Plan:      Problem List Items Addressed This Visit      Genitourinary   Vaginal atrophy  - Primary    Clearly has atrophic vaginitis on exam with complete loss of rugae Will try less frequent Estrace cream to see if patient is able to tolerate Try half applicator full twice weekly      Relevant Orders   Ambulatory referral to Urogynecology   Cystocele without uterine prolapse    Appears to have cystocele on exam Could be causing urinary frequency Patient does piltes and other pelvic floor strengthening exercises, so not interested in pelvic floor PT at this time Will refer to Urogyn at [facility] to see if patient needs further intervention        Other   Urinary frequency    UA without signs of infection or hematuria Discussed decreased caffeine intake, stay hydrated Could be related to apparent cystocele Referral to Urogyn for further eval/management      Relevant Orders   POCT urinalysis dipstick (Completed)   Ambulatory referral to Urogynecology      Return if symptoms worsen or fail to improve.     The entirety of the information documented in the History of Present Illness, Review of Systems and Physical Exam were personally obtained by me. Portions of this information were initially documented by Irving Burton Ratchford, CMA and reviewed by me for thoroughness and accuracy.    Erasmo Downer, MD, MPH [facility] 12/29/2016 3:14 PM

## 2016-12-28 NOTE — Patient Instructions (Signed)
Atrophic Vaginitis Atrophic vaginitis is a condition in which the tissues that line the vagina become dry and thin. This condition is most common in women who have stopped having regular menstrual periods (menopause). This usually starts when a woman is 45-63 years old. Estrogen helps to keep the vagina moist. It stimulates the vagina to produce a clear fluid that lubricates the vagina for sexual intercourse. This fluid also protects the vagina from infection. Lack of estrogen can cause the lining of the vagina to get thinner and dryer. The vagina may also shrink in size. It may become less elastic. Atrophic vaginitis tends to get worse over time as a woman's estrogen level drops. What are the causes? This condition is caused by the normal drop in estrogen that happens around the time of menopause. What increases the risk? Certain conditions or situations may lower a woman's estrogen level, which increases her risk of atrophic vaginitis. These include:  Taking medicine that blocks estrogen.  Having ovaries removed surgically.  Being treated for cancer with X-ray treatment (radiation) or medicines (chemotherapy).  Exercising very hard and often.  Having an eating disorder (anorexia).  Giving birth or breastfeeding.  Being over the age of 50.  Smoking.  What are the signs or symptoms? Symptoms of this condition include:  Pain, soreness, or bleeding during sexual intercourse (dyspareunia).  Vaginal burning, irritation, or itching.  Pain or bleeding during a vaginal examination using a speculum (pelvic exam).  Loss of interest in sexual activity.  Having burning pain when passing urine.  Vaginal discharge that is brown or yellow.  In some cases, there are no symptoms. How is this diagnosed? This condition is diagnosed with a medical history and physical exam. This will include a pelvic exam that checks whether the inside of your vagina appears pale, thin, or dry. Rarely, you may  also have other tests, including:  A urine test.  A test that checks the acid balance in your vaginal fluid (acid balance test).  How is this treated? Treatment for this condition may depend on the severity of your symptoms. Treatment may include:  Using an over-the-counter vaginal lubricant before you have sexual intercourse.  Using a long-acting vaginal moisturizer.  Using low-dose vaginal estrogen for moderate to severe symptoms that do not respond to other treatments. Options include creams, tablets, and inserts (vaginal rings). Before using vaginal estrogen, tell your health care provider if you have a history of: ? Breast cancer. ? Endometrial cancer. ? Blood clots.  Taking medicines. You may be able to take a daily pill for dyspareunia. Discuss all of the risks of this medicine with your health care provider. It is usually not recommended for women who have a family history or personal history of breast cancer.  If your symptoms are very mild and you are not sexually active, you may not need treatment. Follow these instructions at home:  Take medicines only as directed by your health care provider. Do not use herbal or alternative medicines unless your health care provider says that you can.  Use over-the-counter creams, lubricants, or moisturizers for dryness only as directed by your health care provider.  If your atrophic vaginitis is caused by menopause, discuss all of your menopausal symptoms and treatment options with your health care provider.  Do not douche.  Do not use products that can make your vagina dry. These include: ? Scented feminine sprays. ? Scented tampons. ? Scented soaps.  If it hurts to have sex, talk with your sexual   partner. Contact a health care provider if:  Your discharge looks different than normal.  Your vagina has an unusual smell.  You have new symptoms.  Your symptoms do not improve with treatment.  Your symptoms get worse. This  information is not intended to replace advice given to you by your health care provider. Make sure you discuss any questions you have with your health care provider. Document Released: 05/27/2014 Document Revised: 06/18/2015 Document Reviewed: 01/01/2014 Elsevier Interactive Patient Education  2018 Elsevier Inc.  

## 2016-12-29 DIAGNOSIS — N8110 Cystocele, unspecified: Secondary | ICD-10-CM | POA: Insufficient documentation

## 2016-12-29 NOTE — Assessment & Plan Note (Signed)
Clearly has atrophic vaginitis on exam with complete loss of rugae Will try less frequent Estrace cream to see if patient is able to tolerate Try half applicator full twice weekly

## 2016-12-29 NOTE — Assessment & Plan Note (Signed)
UA without signs of infection or hematuria Discussed decreased caffeine intake, stay hydrated Could be related to apparent cystocele Referral to Urogyn for further eval/management

## 2016-12-29 NOTE — Assessment & Plan Note (Signed)
Appears to have cystocele on exam Could be causing urinary frequency Patient does piltes and other pelvic floor strengthening exercises, so not interested in pelvic floor PT at this time [facility] to see if patient needs further intervention

## 2017-01-09 ENCOUNTER — Encounter: Payer: Self-pay | Admitting: Family Medicine

## 2017-01-26 ENCOUNTER — Encounter: Payer: Self-pay | Admitting: Family Medicine

## 2017-01-31 NOTE — Telephone Encounter (Signed)
Patient is calling to see if you have received these results yet.  Please advise

## 2017-03-07 ENCOUNTER — Telehealth: Payer: Self-pay | Admitting: Family Medicine

## 2017-03-07 NOTE — Telephone Encounter (Signed)
Patient states that [facility] [place] states that they have not received a prescription for the compound that you have talked with patient about.  She states that they also have not received anything for a progesterone tablet.  She does not know the names of these medications and will call back with that information.

## 2017-03-07 NOTE — Telephone Encounter (Signed)
[facility] called and states that they are faxing a request for these two medications.

## 2017-03-08 NOTE — Telephone Encounter (Signed)
Originally faxed 02/01/2017; refaxed today.

## 2017-06-23 ENCOUNTER — Ambulatory Visit (INDEPENDENT_AMBULATORY_CARE_PROVIDER_SITE_OTHER): Payer: Self-pay | Admitting: Family Medicine

## 2017-06-23 ENCOUNTER — Encounter: Payer: Self-pay | Admitting: Family Medicine

## 2017-06-23 VITALS — BP 170/104 | HR 68 | Temp 97.9°F | Resp 16 | Wt 128.0 lb

## 2017-06-23 DIAGNOSIS — F410 Panic disorder [episodic paroxysmal anxiety] without agoraphobia: Secondary | ICD-10-CM

## 2017-06-23 DIAGNOSIS — F419 Anxiety disorder, unspecified: Secondary | ICD-10-CM

## 2017-06-23 MED ORDER — CITALOPRAM HYDROBROMIDE 10 MG PO TABS: 10.0000 mg | ORAL_TABLET | Freq: Every day | ORAL | 2 refills | Status: DC

## 2017-06-23 NOTE — Patient Instructions (Signed)
Panic Attack A panic attack is a sudden episode of severe anxiety, fear, or discomfort that causes physical and emotional symptoms. The attack may be in response to something frightening, or it may occur for no known reason. Symptoms of a panic attack can be similar to symptoms of a heart attack or stroke. It is important to see your health care provider when you have a panic attack so that these conditions can be ruled out. A panic attack is a symptom of another condition. Most panic attacks go away with treatment of the underlying problem. If you have panic attacks often, you may have a condition called panic disorder. What are the causes? A panic attack may be caused by:  An extreme, life-threatening situation, such as a war or natural disaster.  An anxiety disorder, such as post-traumatic stress disorder.  Depression.  Certain medical conditions, including heart problems, neurological conditions, and infections.  Certain over-the-counter and prescription medicines.  Illegal drugs that increase heart rate and blood pressure, such as methamphetamine.  Alcohol.  Supplements that increase anxiety.  Panic disorder.  What increases the risk? You are more likely to develop this condition if:  You have an anxiety disorder.  You have another mental health condition.  You take certain medicines.  You use alcohol, illegal drugs, or other substances.  You are under extreme stress.  A life event is causing increased feelings of anxiety and depression.  What are the signs or symptoms? A panic attack starts suddenly, usually lasts about 20 minutes, and occurs with one or more of the following:  A pounding heart.  A feeling that your heart is beating irregularly or faster than normal (palpitations).  Sweating.  Trembling or shaking.  Shortness of breath or feeling smothered.  Feeling choked.  Chest pain or discomfort.  Nausea or a strange feeling in your  stomach.  Dizziness, feeling lightheaded, or feeling like you might faint.  Chills or hot flashes.  Numbness or tingling in your lips, hands, or feet.  Feeling confused, or feeling that you are not yourself.  Fear of losing control or being emotionally unstable.  Fear of dying.  How is this diagnosed? A panic attack is diagnosed with an assessment by your health care provider. During the assessment your health care provider will ask questions about:  Your history of anxiety, depression, and panic attacks.  Your medical history.  Whether you drink alcohol, use illegal drugs, take supplements, or take medicines. Be honest about your substance use.  Your health care provider may also:  Order blood tests or other kinds of tests to rule out serious medical conditions.  Refer you to a mental health professional for further evaluation.  How is this treated? Treatment depends on the cause of the panic attack:  If the cause is a medical problem, your health care provider will either treat that problem or refer you to a specialist.  If the cause is emotional, you may be given anti-anxiety medicines or referred to a counselor. These medicines may reduce how often attacks happen, reduce how severe the attacks are, and lower anxiety.  If the cause is a medicine, your health care provider may tell you to stop the medicine, change your dose, or take a different medicine.  If the cause is a drug, treatment may involve letting the drug wear off and taking medicine to help the drug leave your body or to counteract its effects. Attacks caused by drug abuse may continue even if you stop using   the drug.  Follow these instructions at home:  Take over-the-counter and prescription medicines only as told by your health care provider.  If you feel anxious, limit your caffeine intake.  Take good care of your physical and mental health by: ? Eating a balanced diet that includes plenty of fresh  fruits and vegetables, whole grains, lean meats, and low-fat dairy. ? Getting plenty of rest. Try to get 7-8 hours of uninterrupted sleep each night. ? Exercising regularly. Try to get 30 minutes of physical activity at least 5 days a week. ? Not smoking. Talk to your health care provider if you need help quitting. ? Limiting alcohol intake to no more than 1 drink a day for nonpregnant women and 2 drinks a day for men. One drink equals 12 oz of beer, 5 oz of wine, or 1 oz of hard liquor.  Keep all follow-up visits as told by your health care provider. This is important. Panic attacks may have underlying physical or emotional problems that take time to accurately diagnose. Contact a health care provider if:  Your symptoms do not improve, or they get worse.  You are not able to take your medicine as prescribed because of side effects. Get help right away if:  You have serious thoughts about hurting yourself or others.  You have symptoms of a panic attack. Do not drive yourself to the hospital. Have someone else drive you or call an ambulance. If you ever feel like you may hurt yourself or others, or you have thoughts about taking your own life, get help right away. You can go to your nearest emergency department or call:  Your local emergency services (911 in the [place]  A suicide crisis helpline, such as the National Suicide Prevention Lifeline at 1-800-273-8255. This is open 24 hours a day.  Summary  A panic attack is a sign of a serious health or mental health condition. Get help right away. Do not drive yourself to the hospital. Have someone else drive you or call an ambulance.  Always see a health care provider to have the reasons for the panic attack correctly diagnosed.  If your panic attack was caused by a physical problem, follow your health care provider's suggestions for medicine, referral to a specialist, and lifestyle changes.  If your panic attack was caused by an  emotional problem, follow through with counseling from a qualified mental health specialist.  If you feel like you may hurt yourself or others, call 911 and get help right away. This information is not intended to replace advice given to you by your health care provider. Make sure you discuss any questions you have with your health care provider. Document Released: 01/10/2005 Document Revised: 02/19/2016 Document Reviewed: 02/19/2016 Elsevier Interactive Patient Education  2018 Elsevier Inc[employer]  

## 2017-06-23 NOTE — Progress Notes (Signed)
Patient: Amber Hayes Female    DOB: 04/20/1955   62 y.o.   MRN: [id number] Visit Date: 06/23/2017  Darlina GuysI, Emily Ratchford, CMA, am acting as scribe for Shirlee LatchAngela Bacigalupo, MD.  [phone]'s Provider: Shirlee LatchAngela Bacigalupo, MD   Chief Complaint  Patient presents with  . Panic Attack   Subjective:    HPI   Pt states she is concerned about having a panic attack. She states her oldest son is starting treatments for drug/alcohol abuse. [place] and will start treatment there. Pt believes she is experiencing a "delayed reaction" to this news. She states she has black outs from this morning. She spoke with her son this morning, and feels as if the conversation went well, but she can not remember the conversation. She went for a walk, and felt like she was in a "dream state". She was afraid this was the beginnings of a "psychotic break". She denies hallucinations, SI/HI, self harm thoughts. She states she feels "overwhelmed".  No Known Allergies   Current Outpatient Medications:  .  NONFORMULARY OR COMPOUNDED ITEM, Hormone cream insert, Disp: , Rfl:  .  Probiotic Product (PROBIOTIC DAILY PO), Take by mouth., Disp: , Rfl:  .  progesterone (PROMETRIUM) 100 MG capsule, Take by mouth., Disp: , Rfl:   Review of Systems  Psychiatric/Behavioral: Positive for confusion and dysphoric mood. Negative for agitation, behavioral problems, hallucinations, self-injury and suicidal ideas. The patient is nervous/anxious.     Social History   Tobacco Use  . Smoking status: Former Smoker    Packs/day: 0.50    Years: 15.00    Pack years: 7.50    Last attempt to quit: 01/24/2004    Years since quitting: 13.4  . Smokeless tobacco: Never Used  Substance Use Topics  . Alcohol use: No   Objective:   BP (!) 170/104 (BP Location: Left Arm, Patient Position: Sitting, Cuff Size: Normal)   Pulse 68   Temp 97.9 F (36.6 C) (Oral)   Resp 16   Wt 128 lb (58.1 kg)   SpO2 99%   BMI  20.05 kg/m  Vitals:   06/23/17 1527  BP: (!) 170/104  Pulse: 68  Resp: 16  Temp: 97.9 F (36.6 C)  TempSrc: Oral  SpO2: 99%  Weight: 128 lb (58.1 kg)     Physical Exam  Constitutional: She is oriented to person, place, and time. She appears well-developed and well-nourished. No distress.  HENT:  Head: Normocephalic and atraumatic.  Right Ear: External ear normal.  Left Ear: External ear normal.  Nose: Nose normal.  Mouth/Throat: Oropharynx is clear and moist. No oropharyngeal exudate.  Eyes: Pupils are equal, round, and reactive to light. Conjunctivae and EOM are normal. Right eye exhibits no discharge. Left eye exhibits no discharge. No scleral icterus.  Neck: Neck supple. No thyromegaly present.  Cardiovascular: Normal rate, regular rhythm, normal heart sounds and intact distal pulses.  No murmur heard. Pulmonary/Chest: Effort normal and breath sounds normal. No respiratory distress. She has no wheezes. She has no rales.  Abdominal: Soft. She exhibits no distension. There is no tenderness.  Musculoskeletal: She exhibits no edema.  Lymphadenopathy:    She has no cervical adenopathy.  Neurological: She is alert and oriented to person, place, and time.  Skin: Skin is warm and dry. Capillary refill takes less than 2 seconds.  Psychiatric: Her speech is normal. Judgment normal. Her mood appears anxious. Her affect is blunt. She is slowed. She is  not agitated, not aggressive and not actively hallucinating. Cognition and memory are normal. She exhibits a depressed mood. She expresses no homicidal and no suicidal ideation. She expresses no suicidal plans and no homicidal plans.  Vitals reviewed.      Assessment & Plan:   Problem List Items Addressed This Visit      Other   Anxiety - Primary    Long-standing issue with no treatment in the last 2 years or so Patient was previously doing well on Celexa, but felt that it lost its efficacy over time She did not do well on Zoloft  or Lexapro She states she has a good support system and a therapist through her church She was previously hesitant to take medication and she felt like her face should carry her through, but she does acknowledge that she needs to do this at this time Patient is safe to herself and was contracted for safety and acknowledged what to do if she does develop SI, HI, AVH Start Celexa 10 mg daily at bedtime Patient states that she typically needs smaller doses of medications and other people, so we will start at this low dose Follow-up in 6 weeks and consider dose titration      Relevant Medications   citalopram (CELEXA) 10 MG tablet   Panic attack    Patient with psychologic and somatic symptoms earlier today with some dissociation from some of the memory She did not lose consciousness and her neurologic symptoms were symmetric in bilateral Her neuro exam and exam otherwise is completely normal today and reassuring, as well as the fact that she is nearly back to baseline at this time As above, we will treat her anxiety with an SSRI and therapy Did discuss with patient possibility of rare benzo use for times of intense panic attacks such as today, but given her history of alcohol abuse, we decided together not to prescribe this Discussed return precautions and contracted for safety      Relevant Medications   citalopram (CELEXA) 10 MG tablet       Return in about 6 weeks (around 08/04/2017) for anxiety f/u.   The entirety of the information documented in the History of Present Illness, Review of Systems and Physical Exam were personally obtained by me. Portions of this information were initially documented by Irving Burton Ratchford, CMA and reviewed by me for thoroughness and accuracy.    Erasmo Downer, MD, MPH [facility] 06/23/2017 5:03 PM

## 2017-06-23 NOTE — Assessment & Plan Note (Signed)
Patient with psychologic and somatic symptoms earlier today with some dissociation from some of the memory She did not lose consciousness and her neurologic symptoms were symmetric in bilateral Her neuro exam and exam otherwise is completely normal today and reassuring, as well as the fact that she is nearly back to baseline at this time As above, we will treat her anxiety with an SSRI and therapy Did discuss with patient possibility of rare benzo use for times of intense panic attacks such as today, but given her history of alcohol abuse, we decided together not to prescribe this Discussed return precautions and contracted for safety

## 2017-06-23 NOTE — Assessment & Plan Note (Signed)
Long-standing issue with no treatment in the last 2 years or so Patient was previously doing well on Celexa, but felt that it lost its efficacy over time She did not do well on Zoloft or Lexapro She states she has a good support system and a therapist through her church She was previously hesitant to take medication and she felt like her face should carry her through, but she does acknowledge that she needs to do this at this time Patient is safe to herself and was contracted for safety and acknowledged what to do if she does develop SI, HI, AVH Start Celexa 10 mg daily at bedtime Patient states that she typically needs smaller doses of medications and other people, so we will start at this low dose Follow-up in 6 weeks and consider dose titration

## 2017-08-04 ENCOUNTER — Ambulatory Visit (INDEPENDENT_AMBULATORY_CARE_PROVIDER_SITE_OTHER): Payer: Self-pay | Admitting: Family Medicine

## 2017-08-04 ENCOUNTER — Encounter: Payer: Self-pay | Admitting: Family Medicine

## 2017-08-04 VITALS — BP 132/88 | HR 66 | Temp 97.7°F | Resp 16 | Ht 66.0 in | Wt 127.0 lb

## 2017-08-04 DIAGNOSIS — F419 Anxiety disorder, unspecified: Secondary | ICD-10-CM

## 2017-08-04 DIAGNOSIS — F410 Panic disorder [episodic paroxysmal anxiety] without agoraphobia: Secondary | ICD-10-CM

## 2017-08-04 MED ORDER — CITALOPRAM HYDROBROMIDE 20 MG PO TABS: 20.0000 mg | ORAL_TABLET | Freq: Every day | ORAL | 5 refills | Status: DC

## 2017-08-04 NOTE — Assessment & Plan Note (Signed)
Chronic issue Recently started treatment with Celexa 10 mg daily at last visit at end of May 2019 She is tolerating her Celexa well She did not tolerate Zoloft or Lexapro in the past She does seem to have a good support system and is using her therapist appropriately Again, we had a long discussion regarding importance of medication and therapy and their synergistic effect and her hesitation to take medication due to stigma She is safe to herself and denies SI, HI, AVH We will increase her Celexa 20 mg daily at bedtime as she seems to be somewhat improved on 10 mg and tolerating it well No further panic attacks Long discussion regarding somatic symptoms, which is what this tightness and palpitations seems to be It is encouraging that this chest/throat tightness improves with exercise Did discuss with the patient that her heart rate is normal and regular today and I doubt A. fib, but given the family history and the palpitations, she may benefit from a cardiology referral for possible 30-day event monitor to ensure no signs of A. fib in the future She does not wish to get this referral today but we will readdress at next visit

## 2017-08-04 NOTE — Patient Instructions (Signed)

## 2017-08-04 NOTE — Assessment & Plan Note (Signed)
As above, no further panic attacks, but does continue to have somatic symptoms Treatment as above for anxiety

## 2017-08-04 NOTE — Progress Notes (Signed)
Patient: Amber Hayes Female    DOB: 07-Jan-1956   62 y.o.   MRN: [id number] Visit Date: 08/04/2017  Today's Provider: Shirlee Latch, MD   I, Joslyn Hy, CMA, am acting as scribe for Shirlee Latch, MD.  Chief Complaint  Patient presents with  . Anxiety   Subjective:    Anxiety  Presents for follow-up (LOV 06/23/2017; pt was started on Celexa 10 mg at LOV, and advised to start therapy) visit. Symptoms include dizziness, excessive worry, feeling of choking (tightness), muscle tension, nervous/anxious behavior (improved) and palpitations (pt's sister was just diagnosed with afib, and pt is concerned that her irregular heart rate could be afib vs an "adrenal" issue). Patient reports no chest pain, compulsions, confusion, decreased concentration, depressed mood (improved), hyperventilation, insomnia, irritability, panic or suicidal ideas. The severity of symptoms is moderate (improving).   Compliance with medications: good compliance. Treatment side effects: no side effects.   Patient denies any side effects from Celexa.  She does believe that she is having some improvement in her overall anxiety level and experiencing more joy than she was previously.  She describes a feeling in her chest that is a tightness with occasional palpitations and a sense of a fluttering or butterflies rising from her stomach into her chest.  This occurs when she is more worried or at night when she is trying to sleep.  She notes it is better when she is exercising and getting her mind off of it.  She worries about A. fib because her sister was recently diagnosed with A. Fib.  She is talking with a therapist intermittently when things come up.  She states her life has been somewhat less stressful since she was seen last time.  She has had no more panic attacks or dissociation like she did prior to her last visit.     No Known Allergies   Current Outpatient Medications:  .  citalopram (CELEXA)  10 MG tablet, Take 1 tablet (10 mg total) by mouth daily., Disp: 30 tablet, Rfl: 2 .  NONFORMULARY OR COMPOUNDED ITEM, Hormone cream insert, Disp: , Rfl:  .  Probiotic Product (PROBIOTIC DAILY PO), Take by mouth., Disp: , Rfl:  .  progesterone (PROMETRIUM) 100 MG capsule, Take by mouth., Disp: , Rfl:   Review of Systems  Constitutional: Negative.  Negative for irritability.  HENT: Negative.   Respiratory: Negative.   Cardiovascular: Positive for palpitations (pt's sister was just diagnosed with afib, and pt is concerned that her irregular heart rate could be afib vs an "adrenal" issue). Negative for chest pain.  Gastrointestinal: Negative.   Genitourinary: Negative.   Musculoskeletal: Negative.   Neurological: Positive for dizziness. Negative for tremors, seizures, syncope, facial asymmetry, speech difficulty, weakness, light-headedness, numbness and headaches.  Hematological: Negative.   Psychiatric/Behavioral: Negative for confusion, decreased concentration and suicidal ideas. The patient is nervous/anxious (improved). The patient does not have insomnia.     Social History   Tobacco Use  . Smoking status: Former Smoker    Packs/day: 0.50    Years: 15.00    Pack years: 7.50    Last attempt to quit: 01/24/2004    Years since quitting: 13.5  . Smokeless tobacco: Never Used  Substance Use Topics  . Alcohol use: No   Objective:   BP 132/88 (BP Location: Left Arm, Patient Position: Sitting, Cuff Size: Normal)   Pulse 66   Temp 97.7 F (36.5 C) (Oral)   Resp 16   Ht 5'  6" (1.676 m)   Wt 127 lb (57.6 kg)   SpO2 99%   BMI 20.50 kg/m  Vitals:   08/04/17 1045  BP: 132/88  Pulse: 66  Resp: 16  Temp: 97.7 F (36.5 C)  TempSrc: Oral  SpO2: 99%  Weight: 127 lb (57.6 kg)  Height: 5\' 6"  (1.676 m)     Physical Exam  Constitutional: She is oriented to person, place, and time. She appears well-developed and well-nourished. No distress.  HENT:  Head: Normocephalic and  atraumatic.  Eyes: Conjunctivae are normal. No scleral icterus.  Neck: Neck supple. No thyromegaly present.  Cardiovascular: Normal rate, regular rhythm, normal heart sounds and intact distal pulses.  No murmur heard. Pulmonary/Chest: Effort normal and breath sounds normal. No respiratory distress. She has no wheezes. She has no rales.  Abdominal: Soft. She exhibits no distension. There is no tenderness.  Musculoskeletal: She exhibits no edema.  Lymphadenopathy:    She has no cervical adenopathy.  Neurological: She is alert and oriented to person, place, and time.  Skin: Skin is warm and dry. Capillary refill takes less than 2 seconds. No rash noted.  Psychiatric: Her speech is normal and behavior is normal. Her mood appears anxious. Her affect is not blunt and not labile. She does not exhibit a depressed mood. She expresses no homicidal and no suicidal ideation. She expresses no suicidal plans and no homicidal plans.  Vitals reviewed.   Depression screen [facility] 2/9 08/04/2017 07/04/2016 05/18/2015  Decreased Interest 0 0 1  Down, Depressed, Hopeless 0 0 1  PHQ - 2 Score 0 0 2  Altered sleeping 0 1 0  Tired, decreased energy 1 1 3   Change in appetite 1 0 0  Feeling bad or failure about yourself  1 0 2  Trouble concentrating 1 1 0  Moving slowly or fidgety/restless 0 0 0  Suicidal thoughts 0 0 0  PHQ-9 Score 4 3 7   Difficult doing work/chores Not difficult at all Not difficult at all Somewhat difficult    GAD 7 : Generalized Anxiety Score 08/04/2017  Nervous, Anxious, on Edge 2  Control/stop worrying 1  Worry too much - different things 1  Trouble relaxing 1  Restless 1  Easily annoyed or irritable 1  Afraid - awful might happen 1  Total GAD 7 Score 8  Anxiety Difficulty Somewhat difficult      Assessment & Plan:   Problem List Items Addressed This Visit      Other   Anxiety - Primary    Chronic issue Recently started treatment with Celexa 10 mg daily at last visit at end of  May 2019 She is tolerating her Celexa well She did not tolerate Zoloft or Lexapro in the past She does seem to have a good support system and is using her therapist appropriately Again, we had a long discussion regarding importance of medication and therapy and their synergistic effect and her hesitation to take medication due to stigma She is safe to herself and denies SI, HI, AVH We will increase her Celexa 20 mg daily at bedtime as she seems to be somewhat improved on 10 mg and tolerating it well No further panic attacks Long discussion regarding somatic symptoms, which is what this tightness and palpitations seems to be It is encouraging that this chest/throat tightness improves with exercise Did discuss with the patient that her heart rate is normal and regular today and I doubt A. fib, but given the family history and the palpitations, she  may benefit from a cardiology referral for possible 30-day event monitor to ensure no signs of A. fib in the future She does not wish to get this referral today but we will readdress at next visit      Relevant Medications   citalopram (CELEXA) 20 MG tablet   Panic attack    As above, no further panic attacks, but does continue to have somatic symptoms Treatment as above for anxiety      Relevant Medications   citalopram (CELEXA) 20 MG tablet       Return in about 3 months (around 11/04/2017) for anxiety f/u.   The entirety of the information documented in the History of Present Illness, Review of Systems and Physical Exam were personally obtained by me. Portions of this information were initially documented by Irving BurtonEmily Ratchford, CMA and reviewed by me for thoroughness and accuracy.    Erasmo DownerBacigalupo, Reveca Desmarais M, MD, MPH [facility] 08/04/2017 1:28 PM

## 2017-08-30 ENCOUNTER — Encounter: Payer: Self-pay | Admitting: Family Medicine

## 2017-08-30 MED ORDER — BUSPIRONE HCL 10 MG PO TABS: 10.0000 mg | ORAL_TABLET | Freq: Three times a day (TID) | ORAL | 3 refills | Status: DC

## 2017-11-06 ENCOUNTER — Ambulatory Visit: Payer: BLUE CROSS/BLUE SHIELD | Admitting: Family Medicine

## 2017-11-06 ENCOUNTER — Encounter: Payer: Self-pay | Admitting: Family Medicine

## 2017-11-06 VITALS — BP 130/82 | HR 54 | Temp 97.7°F | Wt 125.6 lb

## 2017-11-06 DIAGNOSIS — F419 Anxiety disorder, unspecified: Secondary | ICD-10-CM

## 2017-11-06 DIAGNOSIS — F410 Panic disorder [episodic paroxysmal anxiety] without agoraphobia: Secondary | ICD-10-CM | POA: Diagnosis not present

## 2017-11-06 MED ORDER — BUSPIRONE HCL 5 MG PO TABS: 5.0000 mg | ORAL_TABLET | Freq: Three times a day (TID) | ORAL | 3 refills | Status: DC

## 2017-11-06 MED ORDER — CITALOPRAM HYDROBROMIDE 20 MG PO TABS: 20.0000 mg | ORAL_TABLET | Freq: Every day | ORAL | 5 refills | Status: DC

## 2017-11-06 MED ORDER — CITALOPRAM HYDROBROMIDE 10 MG PO TABS: 30.0000 mg | ORAL_TABLET | Freq: Every day | ORAL | 2 refills | Status: DC

## 2017-11-06 NOTE — Progress Notes (Signed)
Patient: Amber Hayes Female    DOB: [birth year]   62 y.o.   MRN: [id number] Visit Date: 11/06/2017  Today's Provider: Shirlee Latch, MD   Chief Complaint  Patient presents with  . Anxiety  . Panic Attacks   Subjective:    I, Presley Raddle, CMA, am acting as a scribe for Shirlee Latch, MD.   HPI Anxiety & Panic Attack: Patient presents for a 3 month follow up. Last OV was on 08/04/2017. Celexa was increased to 20 mg qhs and added Buspar 10 mg. She reports good compliance with treatment plan. She states depression symptoms are improved, but anxiety symptoms are unchanged. She states she does have a lot going on in her life at this time that could be distributing to the anxiety issues.   Feels like she has a good toolkit of strategies to cope with anxiety   Depression screen [facility] 2/9 08/04/2017 07/04/2016 05/18/2015  Decreased Interest 0 0 1  Down, Depressed, Hopeless 0 0 1  PHQ - 2 Score 0 0 2  Altered sleeping 0 1 0  Tired, decreased energy 1 1 3   Change in appetite 1 0 0  Feeling bad or failure about yourself  1 0 2  Trouble concentrating 1 1 0  Moving slowly or fidgety/restless 0 0 0  Suicidal thoughts 0 0 0  PHQ-9 Score 4 3 7   Difficult doing work/chores Not difficult at all Not difficult at all Somewhat difficult    GAD 7 : Generalized Anxiety Score 08/04/2017  Nervous, Anxious, on Edge 2  Control/stop worrying 1  Worry too much - different things 1  Trouble relaxing 1  Restless 1  Easily annoyed or irritable 1  Afraid - awful might happen 1  Total GAD 7 Score 8  Anxiety Difficulty Somewhat difficult     No Known Allergies   Current Outpatient Medications:  .  busPIRone (BUSPAR) 10 MG tablet, Take 1 tablet (10 mg total) by mouth 3 (three) times daily., Disp: 90 tablet, Rfl: 3 .  citalopram (CELEXA) 20 MG tablet, Take 1 tablet (20 mg total) by mouth daily., Disp: 30 tablet, Rfl: 5 .  NONFORMULARY OR COMPOUNDED ITEM, Hormone cream insert, Disp: ,  Rfl:  .  Probiotic Product (PROBIOTIC DAILY PO), Take by mouth., Disp: , Rfl:  .  progesterone (PROMETRIUM) 100 MG capsule, Take by mouth., Disp: , Rfl:   Review of Systems  Constitutional: Negative.   Respiratory: Negative.   Cardiovascular: Negative.   Musculoskeletal: Negative.   Psychiatric/Behavioral: The patient is nervous/anxious.     Social History   Tobacco Use  . Smoking status: Former Smoker    Packs/day: 0.50    Years: 15.00    Pack years: 7.50    Last attempt to quit: 01/24/2004    Years since quitting: 13.7  . Smokeless tobacco: Never Used  Substance Use Topics  . Alcohol use: No   Objective:   BP 130/82 (BP Location: Right Arm, Patient Position: Sitting, Cuff Size: Normal)   Pulse (!) 54   Temp 97.7 F (36.5 C) (Oral)   Wt 125 lb 9.6 oz (57 kg)   SpO2 98%   BMI 20.27 kg/m  Vitals:   11/06/17 1324  BP: 130/82  Pulse: (!) 54  Temp: 97.7 F (36.5 C)  TempSrc: Oral  SpO2: 98%  Weight: 125 lb 9.6 oz (57 kg)     Physical Exam  Constitutional: She is oriented to person, place, and time. She appears  well-developed and well-nourished. No distress.  HENT:  Head: Normocephalic and atraumatic.  Eyes: Conjunctivae are normal.  Neck: Neck supple. No thyromegaly present.  Cardiovascular: Normal rate, regular rhythm, normal heart sounds and intact distal pulses.  No murmur heard. Pulmonary/Chest: Effort normal and breath sounds normal. No respiratory distress. She has no wheezes. She has no rales.  Musculoskeletal: She exhibits no edema.  Lymphadenopathy:    She has no cervical adenopathy.  Neurological: She is alert and oriented to person, place, and time.  Skin: Skin is warm and dry. Capillary refill takes less than 2 seconds. No rash noted.  Psychiatric: She has a normal mood and affect. Her behavior is normal.  Vitals reviewed.       Assessment & Plan:   Problem List Items Addressed This Visit      Other   Anxiety - Primary    Chronic issue,  not well controlled tolerating celexa well Did not tolerate lexapro or Zoloft in the past She does seem to have a good support system and is using her therapist appropriately She is safe to herself without SI/HI/AVH Will increase celexa to 30mg  qhs No further panic attacks Still having some somatic symptoms Most of her anxiety revolves around her sons and their mental health issues Will decrease BuSpar to 5 mg 3 times daily and then discontinue when she runs out of medication as this does not seem to be helping manage       Relevant Medications   citalopram (CELEXA) 10 MG tablet   busPIRone (BUSPAR) 5 MG tablet   Panic attack    As above, no further panic attacks, but does continue to have somatic symptoms Treat as above for anxiety with increased dose of Celexa and decreasing BuSpar      Relevant Medications   citalopram (CELEXA) 10 MG tablet   busPIRone (BUSPAR) 5 MG tablet       Return in about 3 months (around 02/06/2018) for Anxiety f/u.   The entirety of the information documented in the History of Present Illness, Review of Systems and Physical Exam were personally obtained by me. Portions of this information were initially documented by Presley Raddle, CMA and reviewed by me for thoroughness and accuracy.    Erasmo Downer, MD, MPH [facility] 11/06/2017 2:19 PM

## 2017-11-06 NOTE — Assessment & Plan Note (Signed)
As above, no further panic attacks, but does continue to have somatic symptoms Treat as above for anxiety with increased dose of Celexa and decreasing BuSpar

## 2017-11-06 NOTE — Assessment & Plan Note (Signed)
Chronic issue, not well controlled tolerating celexa well Did not tolerate lexapro or Zoloft in the past She does seem to have a good support system and is using her therapist appropriately She is safe to herself without SI/HI/AVH Will increase celexa to 30mg  qhs No further panic attacks Still having some somatic symptoms Most of her anxiety revolves around her sons and their mental health issues Will decrease BuSpar to 5 mg 3 times daily and then discontinue when she runs out of medication as this does not seem to be helping manage

## 2017-11-06 NOTE — Patient Instructions (Signed)
Call the Ophthalmologist!  Decrease Buspar to 5 mg three times daily until you run out of current prescription  Increase Celexa to 30mg  daily

## 2017-11-07 DIAGNOSIS — H33312 Horseshoe tear of retina without detachment, left eye: Secondary | ICD-10-CM | POA: Diagnosis not present

## 2017-11-07 DIAGNOSIS — G43109 Migraine with aura, not intractable, without status migrainosus: Secondary | ICD-10-CM | POA: Diagnosis not present

## 2018-02-08 ENCOUNTER — Encounter: Payer: Self-pay | Admitting: Family Medicine

## 2018-02-08 ENCOUNTER — Ambulatory Visit: Payer: BLUE CROSS/BLUE SHIELD | Admitting: Family Medicine

## 2018-02-08 VITALS — BP 158/89 | HR 66 | Temp 97.5°F | Wt 125.6 lb

## 2018-02-08 DIAGNOSIS — F419 Anxiety disorder, unspecified: Secondary | ICD-10-CM | POA: Diagnosis not present

## 2018-02-08 DIAGNOSIS — R030 Elevated blood-pressure reading, without diagnosis of hypertension: Secondary | ICD-10-CM

## 2018-02-08 MED ORDER — CITALOPRAM HYDROBROMIDE 10 MG PO TABS: 30.0000 mg | ORAL_TABLET | Freq: Every day | ORAL | 2 refills | Status: DC

## 2018-02-08 NOTE — Assessment & Plan Note (Signed)
Well controlled Doing well off of buspar Continue celexa at current dose She is safe to self and has good support system

## 2018-02-08 NOTE — Patient Instructions (Signed)
Blood Pressure Record Sheet To take your blood pressure, you will need a blood pressure machine. You can buy a blood pressure machine (blood pressure monitor) at your clinic, drug store, or online. When choosing one, consider:  An automatic monitor that has an arm cuff.  A cuff that wraps snugly around your upper arm. You should be able to fit only one finger between your arm and the cuff.  A device that stores blood pressure reading results.  Do not choose a monitor that measures your blood pressure from your wrist or finger. Follow your health care provider's instructions for how to take your blood pressure. To use this form:  Get one reading in the morning (a.m.) before you take any medicines.  Get one reading in the evening (p.m.) before supper.  Take at least 2 readings with each blood pressure check. This makes sure the results are correct. Wait 1-2 minutes between measurements.  Write down the results in the spaces on this form.  Repeat this once a week, or as told by your health care provider.  Make a follow-up appointment with your health care provider to discuss the results. Blood pressure log Date: _______________________  a.m. _____________________(1st reading) _____________________(2nd reading)  p.m. _____________________(1st reading) _____________________(2nd reading) Date: _______________________  a.m. _____________________(1st reading) _____________________(2nd reading)  p.m. _____________________(1st reading) _____________________(2nd reading) Date: _______________________  a.m. _____________________(1st reading) _____________________(2nd reading)  p.m. _____________________(1st reading) _____________________(2nd reading) Date: _______________________  a.m. _____________________(1st reading) _____________________(2nd reading)  p.m. _____________________(1st reading) _____________________(2nd reading) Date: _______________________  a.m.  _____________________(1st reading) _____________________(2nd reading)  p.m. _____________________(1st reading) _____________________(2nd reading) This information is not intended to replace advice given to you by your health care provider. Make sure you discuss any questions you have with your health care provider. Document Released: 10/09/2002 Document Revised: 01/10/2017 Document Reviewed: 01/10/2017 Elsevier Interactive Patient Education  2019 Elsevier Inc.  

## 2018-02-08 NOTE — Progress Notes (Signed)
Patient: Amber Hayes Female    DOB: 04/30/1955   63 y.o.   MRN: [id number] Visit Date: 02/08/2018  Today's Provider: Shirlee LatchAngela Dashanti Burr, MD   Chief Complaint  Patient presents with  . Anxiety  . Depression   Subjective:    I, Presley RaddleNikki Walston, CMA, am acting as a scribe for Shirlee LatchAngela Burnetta Kohls, MD.   HPI Anxiety & Panic Attack: Patient presents for a 3 month follow up. Last OV was on 11/06/2017. Celexa was increased to 30 mg qhs and continue Buspar 5 mg. She reports fair compliance with treatment plan. She discontinued Buspar. She states depression symptoms are stable.  GAD 7 : Generalized Anxiety Score 11/06/2017 08/04/2017  Nervous, Anxious, on Edge 2 2  Control/stop worrying 1 1  Worry too much - different things 1 1  Trouble relaxing 1 1  Restless 1 1  Easily annoyed or irritable 1 1  Afraid - awful might happen 1 1  Total GAD 7 Score 8 8  Anxiety Difficulty Somewhat difficult Somewhat difficult    [facility] 11/06/2017 08/04/2017 07/04/2016  Decreased Interest 0 0 0  Down, Depressed, Hopeless 0 0 0  PHQ - 2 Score 0 0 0  Altered sleeping 1 0 1  Tired, decreased energy 1 1 1   Change in appetite 0 1 0  Feeling bad or failure about yourself  1 1 0  Trouble concentrating 1 1 1   Moving slowly or fidgety/restless 0 0 0  Suicidal thoughts 0 0 0  PHQ-9 Score 4 4 3   Difficult doing work/chores Not difficult at all Not difficult at all Not difficult at all    BP is elevated again today.  She is eating low sodium healthy diet and exercising regularly.  She does have fam hx HTN.  She has never taken an antihypertensive.  She does admit to high levels of stress     No Known Allergies   Current Outpatient Medications:  .  busPIRone (BUSPAR) 5 MG tablet, Take 1 tablet (5 mg total) by mouth 3 (three) times daily., Disp: 90 tablet, Rfl: 3 .  citalopram (CELEXA) 10 MG tablet, Take 3 tablets (30 mg total) by mouth daily., Disp: 90 tablet, Rfl: 2 .  NONFORMULARY  OR COMPOUNDED ITEM, Hormone cream insert, Disp: , Rfl:  .  Probiotic Product (PROBIOTIC DAILY PO), Take by mouth., Disp: , Rfl:  .  progesterone (PROMETRIUM) 100 MG capsule, Take by mouth., Disp: , Rfl:    Review of Systems  Constitutional: Negative.   Respiratory: Negative.   Cardiovascular: Negative.   Musculoskeletal: Negative.   Psychiatric/Behavioral: The patient is nervous/anxious.        Depression and panic attacks    Social History   Tobacco Use  . Smoking status: Former Smoker    Packs/day: 0.50    Years: 15.00    Pack years: 7.50    Last attempt to quit: 01/24/2004    Years since quitting: 14.0  . Smokeless tobacco: Never Used  Substance Use Topics  . Alcohol use: No      Objective:   BP (!) 158/89 (BP Location: Right Arm, Patient Position: Sitting, Cuff Size: Normal)   Pulse 66   Temp (!) 97.5 F (36.4 C) (Oral)   Wt 125 lb 9.6 oz (57 kg)   SpO2 99%   BMI 20.27 kg/m  Vitals:   02/08/18 1125  BP: (!) 158/89  Pulse: 66  Temp: (!) 97.5 F (36.4 C)  TempSrc: Oral  SpO2: 99%  Weight: 125 lb 9.6 oz (57 kg)     Physical Exam Vitals signs reviewed.  Constitutional:      General: She is not in acute distress.    Appearance: Normal appearance.  HENT:     Head: Normocephalic and atraumatic.     Right Ear: External ear normal.     Left Ear: External ear normal.     Nose: Nose normal.     Mouth/Throat:     Pharynx: Oropharynx is clear.  Eyes:     General: No scleral icterus.    Conjunctiva/sclera: Conjunctivae normal.  Neck:     Musculoskeletal: Neck supple.  Cardiovascular:     Rate and Rhythm: Normal rate and regular rhythm.     Pulses: Normal pulses.     Heart sounds: Normal heart sounds. No murmur.  Pulmonary:     Effort: Pulmonary effort is normal. No respiratory distress.     Breath sounds: Normal breath sounds. No wheezing or rhonchi.  Abdominal:     General: There is no distension.     Palpations: Abdomen is soft.     Tenderness:  There is no abdominal tenderness.  Musculoskeletal:     Right lower leg: No edema.     Left lower leg: No edema.  Lymphadenopathy:     Cervical: No cervical adenopathy.  Skin:    General: Skin is warm and dry.     Capillary Refill: Capillary refill takes less than 2 seconds.     Findings: No rash.  Neurological:     Mental Status: She is alert and oriented to person, place, and time. Mental status is at baseline.  Psychiatric:        Mood and Affect: Mood normal.        Behavior: Behavior normal.         Assessment & Plan   Problem List Items Addressed This Visit      Other   Anxiety - Primary    Well controlled Doing well off of buspar Continue celexa at current dose She is safe to self and has good support system      Relevant Medications   citalopram (CELEXA) 10 MG tablet   Blood pressure elevated without history of HTN    Elevated again, but was well controlled last few times Diet and exercise are appropriate She will monitor home BPs and we will f/u at next visit At next visit, we will need to consider antihypertensive if still uncontrolled          Return in about 3 months (around 05/10/2018) for CPE.   The entirety of the information documented in the History of Present Illness, Review of Systems and Physical Exam were personally obtained by me. Portions of this information were initially documented by Presley Raddle, CMA and reviewed by me for thoroughness and accuracy.    Erasmo Downer, MD, MPH [facility] 02/08/2018 1:17 PM

## 2018-02-08 NOTE — Assessment & Plan Note (Signed)
Elevated again, but was well controlled last few times Diet and exercise are appropriate She will monitor home BPs and we will f/u at next visit At next visit, we will need to consider antihypertensive if still uncontrolled

## 2018-03-08 ENCOUNTER — Encounter: Payer: Self-pay | Admitting: Family Medicine

## 2018-03-08 ENCOUNTER — Ambulatory Visit: Payer: BLUE CROSS/BLUE SHIELD | Admitting: Family Medicine

## 2018-03-08 VITALS — BP 128/77 | HR 79 | Temp 98.8°F | Wt 123.0 lb

## 2018-03-08 DIAGNOSIS — J40 Bronchitis, not specified as acute or chronic: Secondary | ICD-10-CM

## 2018-03-08 MED ORDER — PREDNISONE 20 MG PO TABS: 40.0000 mg | ORAL_TABLET | Freq: Every day | ORAL | 0 refills | Status: AC

## 2018-03-08 NOTE — Patient Instructions (Signed)

## 2018-03-08 NOTE — Progress Notes (Signed)
Patient: Amber Hayes Female    DOB: 02-26-1955   63 y.o.   MRN: [id number] Visit Date: 03/08/2018  Today's Provider: Shirlee Latch, MD   Chief Complaint  Patient presents with  . URI   Subjective:     URI   This is a new problem. Episode onset: Started 7 days ago. The problem has been gradually worsening. The maximum temperature recorded prior to her arrival was 100.4 - 100.9 F (Over the weekend). Associated symptoms include congestion, coughing, ear pain, headaches, nausea, neck pain, a sore throat (Pt stated this has improved some) and wheezing. Pertinent negatives include no abdominal pain, diarrhea, rhinorrhea, sinus pain or vomiting. She has tried decongestant for the symptoms. The treatment provided no relief.    No Known Allergies   Current Outpatient Medications:  .  citalopram (CELEXA) 10 MG tablet, Take 3 tablets (30 mg total) by mouth daily., Disp: 270 tablet, Rfl: 2 .  Probiotic Product (PROBIOTIC DAILY PO), Take by mouth., Disp: , Rfl:  .  progesterone (PROMETRIUM) 100 MG capsule, Take by mouth., Disp: , Rfl:  .  NONFORMULARY OR COMPOUNDED ITEM, Hormone cream insert, Disp: , Rfl:   Review of Systems  Constitutional: Positive for appetite change, fatigue and fever.  HENT: Positive for congestion, ear pain and sore throat (Pt stated this has improved some). Negative for ear discharge, postnasal drip, rhinorrhea, sinus pressure, sinus pain, tinnitus, trouble swallowing and voice change.   Eyes: Negative.   Respiratory: Positive for cough and wheezing. Negative for apnea, choking, chest tightness, shortness of breath and stridor.   Gastrointestinal: Positive for nausea. Negative for abdominal distention, abdominal pain, anal bleeding, blood in stool, constipation, diarrhea, rectal pain and vomiting.  Musculoskeletal: Positive for neck pain. Negative for neck stiffness.  Neurological: Positive for headaches. Negative for dizziness and light-headedness.    Hematological: Does not bruise/bleed easily.    Social History   Tobacco Use  . Smoking status: Former Smoker    Packs/day: 0.50    Years: 15.00    Pack years: 7.50    Last attempt to quit: 01/24/2004    Years since quitting: 14.1  . Smokeless tobacco: Never Used  Substance Use Topics  . Alcohol use: No      Objective:   BP 128/77 (BP Location: Left Arm, Patient Position: Sitting, Cuff Size: Normal)   Pulse 79   Temp 98.8 F (37.1 C) (Oral)   Wt 123 lb (55.8 kg)   SpO2 98%   BMI 19.85 kg/m  Vitals:   03/08/18 1326  BP: 128/77  Pulse: 79  Temp: 98.8 F (37.1 C)  TempSrc: Oral  SpO2: 98%  Weight: 123 lb (55.8 kg)     Physical Exam Vitals signs reviewed.  Constitutional:      General: She is not in acute distress.    Appearance: Normal appearance. She is not diaphoretic.  HENT:     Head: Normocephalic and atraumatic.     Right Ear: Tympanic membrane, ear canal and external ear normal.     Left Ear: Tympanic membrane, ear canal and external ear normal.     Nose: Congestion and rhinorrhea present.     Mouth/Throat:     Mouth: Mucous membranes are moist.     Pharynx: Oropharynx is clear. Posterior oropharyngeal erythema present. No oropharyngeal exudate.  Eyes:     General: No scleral icterus.    Conjunctiva/sclera: Conjunctivae normal.     Pupils: Pupils are equal, round, and reactive to light.  Neck:     Musculoskeletal: Neck supple.  Cardiovascular:     Rate and Rhythm: Normal rate and regular rhythm.     Pulses: Normal pulses.     Heart sounds: Normal heart sounds. No murmur.  Pulmonary:     Effort: Pulmonary effort is normal. No respiratory distress.     Breath sounds: Rhonchi present. No wheezing.  Musculoskeletal:     Right lower leg: No edema.     Left lower leg: No edema.  Lymphadenopathy:     Cervical: No cervical adenopathy.  Skin:    General: Skin is warm and dry.     Capillary Refill: Capillary refill takes less than 2 seconds.      Findings: No rash.  Neurological:     Mental Status: She is alert and oriented to person, place, and time. Mental status is at baseline.  Psychiatric:        Mood and Affect: Mood normal.        Behavior: Behavior normal.         Assessment & Plan   1. Bronchitis - symptoms and exam c/w bronchitis - likely viral  - no evidence of strep pharyngitis, CAP, AOM, bacterial sinusitis, or other bacterial infection - treat with prednisone burst - discussed that there is no indication for antibiotics - discussed symptomatic management, natural course, and return precautions     Meds ordered this encounter  Medications  . predniSONE (DELTASONE) 20 MG tablet    Sig: Take 2 tablets (40 mg total) by mouth daily with breakfast for 7 days.    Dispense:  14 tablet    Refill:  0     Return if symptoms worsen or fail to improve.   The entirety of the information documented in the History of Present Illness, Review of Systems and Physical Exam were personally obtained by me. Portions of this information were initially documented by Kavin Leech, CMA and reviewed by me for thoroughness and accuracy.    Erasmo Downer, MD, MPH [facility] 03/08/2018 1:48 PM

## 2018-03-12 ENCOUNTER — Encounter: Payer: Self-pay | Admitting: Family Medicine

## 2018-03-12 MED ORDER — AMOXICILLIN-POT CLAVULANATE 875-125 MG PO TABS: 1.0000 | ORAL_TABLET | Freq: Two times a day (BID) | ORAL | 0 refills | Status: AC

## 2018-04-05 ENCOUNTER — Other Ambulatory Visit: Payer: Self-pay | Admitting: Family Medicine

## 2018-04-05 ENCOUNTER — Telehealth: Payer: Self-pay | Admitting: Family Medicine

## 2018-04-05 NOTE — Telephone Encounter (Signed)
Betsy with [facility] called needing a clarification on the patients rx for Progesterone 100mg .  They had sent a request for the SR 50 mg.      CB# [phone]  Thanks  Barth Kirks

## 2018-04-06 NOTE — Telephone Encounter (Signed)
We can refill whatever the Rx was for the 50mg .

## 2018-04-09 NOTE — Telephone Encounter (Signed)
I think they had faxed the order before.  Can we call them and have them fax it again or we can verbal it?

## 2018-04-10 NOTE — Telephone Encounter (Signed)
Spoke with Tamela Oddi- verbal okay given to refill SR 50 mg with 5 refills. Okay per Dr. Beryle Flock.

## 2018-05-16 ENCOUNTER — Ambulatory Visit: Payer: BLUE CROSS/BLUE SHIELD | Admitting: Family Medicine

## 2018-05-22 ENCOUNTER — Encounter: Payer: BLUE CROSS/BLUE SHIELD | Admitting: Family Medicine

## 2018-07-22 DIAGNOSIS — Z1159 Encounter for screening for other viral diseases: Secondary | ICD-10-CM | POA: Diagnosis not present

## 2018-07-28 ENCOUNTER — Encounter: Payer: Self-pay | Admitting: Family Medicine

## 2018-07-30 ENCOUNTER — Encounter: Payer: BLUE CROSS/BLUE SHIELD | Admitting: Family Medicine

## 2018-08-15 ENCOUNTER — Encounter: Payer: BC Managed Care – PPO | Admitting: Family Medicine

## 2018-09-07 ENCOUNTER — Telehealth: Payer: Self-pay

## 2018-09-07 DIAGNOSIS — F419 Anxiety disorder, unspecified: Secondary | ICD-10-CM

## 2018-09-07 DIAGNOSIS — Z1211 Encounter for screening for malignant neoplasm of colon: Secondary | ICD-10-CM

## 2018-09-07 DIAGNOSIS — Z0000 Encounter for general adult medical examination without abnormal findings: Secondary | ICD-10-CM

## 2018-09-07 DIAGNOSIS — R030 Elevated blood-pressure reading, without diagnosis of hypertension: Secondary | ICD-10-CM

## 2018-09-07 DIAGNOSIS — E7800 Pure hypercholesterolemia, unspecified: Secondary | ICD-10-CM

## 2018-09-07 NOTE — Telephone Encounter (Signed)
Patient called wanting to know if she could have her labs done before her physical?  She also wants to go ahead and have a colonoscopy scheduled because it has been 42yrs since her last one. Please advise. Thanks!

## 2018-09-10 NOTE — Telephone Encounter (Signed)
Patient was advised.  

## 2018-09-10 NOTE — Telephone Encounter (Signed)
Labs ordered. Can be left up front. Should be fasting. Come at least 3 business days before CPE.  Referral placed to GI for colonoscopy.

## 2018-09-10 NOTE — Telephone Encounter (Signed)
Please review. Thanks!  

## 2018-09-24 ENCOUNTER — Telehealth: Payer: Self-pay

## 2018-09-24 ENCOUNTER — Other Ambulatory Visit: Payer: Self-pay

## 2018-09-24 DIAGNOSIS — Z1211 Encounter for screening for malignant neoplasm of colon: Secondary | ICD-10-CM

## 2018-09-24 MED ORDER — NA SULFATE-K SULFATE-MG SULF 17.5-3.13-1.6 GM/177ML PO SOLN: 1.0000 | Freq: Once | ORAL | 0 refills | Status: AC

## 2018-09-24 NOTE — Telephone Encounter (Signed)
Returned patients call to schedule her colonoscopy.  LVM for her to call me back.  Thanks Joneric Streight 

## 2018-09-24 NOTE — Telephone Encounter (Signed)
Gastroenterology Pre-Procedure Review  Request Date: 10/19/18 Requesting Physician: Dr. Vicente Males  PATIENT REVIEW QUESTIONS: The patient responded to the following health history questions as indicated:    1. Are you having any GI issues? no 2. Do you have a personal history of Polyps? no 3. Do you have a family history of Colon Cancer or Polyps? unsure maternal grandfather colon cancer 52. Diabetes Mellitus? no 5. Joint replacements in the past 12 months?no 6. Major health problems in the past 3 months?no 7. Any artificial heart valves, MVP, or defibrillator?no    MEDICATIONS & ALLERGIES:    Patient reports the following regarding taking any anticoagulation/antiplatelet therapy:   Plavix, Coumadin, Eliquis, Xarelto, Lovenox, Pradaxa, Brilinta, or Effient? no Aspirin? no  Patient confirms/reports the following medications:  Current Outpatient Medications  Medication Sig Dispense Refill  . citalopram (CELEXA) 10 MG tablet Take 3 tablets (30 mg total) by mouth daily. 270 tablet 2  . Na Sulfate-K Sulfate-Mg Sulf 17.5-3.13-1.6 GM/177ML SOLN Take 1 kit by mouth once for 1 dose. 354 mL 0  . NONFORMULARY OR COMPOUNDED ITEM Hormone cream insert    . Probiotic Product (PROBIOTIC DAILY PO) Take by mouth.    . progesterone (PROMETRIUM) 100 MG capsule Take 1 capsule by mouth once daily at bedtime. 30 capsule PRN   No current facility-administered medications for this visit.     Patient confirms/reports the following allergies:  No Known Allergies  No orders of the defined types were placed in this encounter.   AUTHORIZATION INFORMATION Primary Insurance: 1D#: Group #:  Secondary Insurance: 1D#: Group #:  SCHEDULE INFORMATION: Date: 10/19/18 Time: Location:[facility]

## 2018-09-25 ENCOUNTER — Other Ambulatory Visit: Payer: Self-pay

## 2018-10-05 ENCOUNTER — Telehealth: Payer: Self-pay | Admitting: Gastroenterology

## 2018-10-05 ENCOUNTER — Other Ambulatory Visit: Payer: Self-pay

## 2018-10-05 DIAGNOSIS — Z1211 Encounter for screening for malignant neoplasm of colon: Secondary | ICD-10-CM

## 2018-10-05 MED ORDER — NA SULFATE-K SULFATE-MG SULF 17.5-3.13-1.6 GM/177ML PO SOLN: 1.0000 | Freq: Once | ORAL | 0 refills | Status: AC

## 2018-10-05 MED ORDER — PEG 3350-KCL-NA BICARB-NACL 420 G PO SOLR: 4000.0000 mL | Freq: Once | ORAL | 0 refills | Status: AC

## 2018-10-05 NOTE — Telephone Encounter (Signed)
Patient called & l/m on v/m stating the cost for the prep was to expensive. She would like information scnt to her my Chart so she can do pricing on medication.Please call [phone] has procedure on 10-19-18

## 2018-10-05 NOTE — Telephone Encounter (Signed)
Pateints bowel prep has been changed to golytely.  We discussed golytely and suprep.  I emailed her a link for cost savings on the [place].  She will decide which prep she will use once she checks with pricing at pharmacy.  Thanks [employer]

## 2018-10-16 ENCOUNTER — Other Ambulatory Visit: Payer: Self-pay

## 2018-10-16 ENCOUNTER — Other Ambulatory Visit
Admission: RE | Admit: 2018-10-16 | Discharge: 2018-10-16 | Disposition: A | Discharge status: Home / Self Care | Payer: BC Managed Care – PPO | Source: Ambulatory Visit | Attending: Gastroenterology | Admitting: Gastroenterology

## 2018-10-16 DIAGNOSIS — K5790 Diverticulosis of intestine, part unspecified, without perforation or abscess without bleeding: Secondary | ICD-10-CM | POA: Insufficient documentation

## 2018-10-16 DIAGNOSIS — Z01812 Encounter for preprocedural laboratory examination: Secondary | ICD-10-CM | POA: Diagnosis not present

## 2018-10-16 DIAGNOSIS — Z20828 Contact with and (suspected) exposure to other viral communicable diseases: Secondary | ICD-10-CM | POA: Insufficient documentation

## 2018-10-17 LAB — SARS CORONAVIRUS 2 (TAT 6-24 HRS): SARS Coronavirus 2: NEGATIVE

## 2018-10-18 ENCOUNTER — Telehealth: Payer: Self-pay | Admitting: Gastroenterology

## 2018-10-18 NOTE — Telephone Encounter (Signed)
Patient has been advised that it was okay for her to start her bowel prep earlier than 5 pm.  She started her prep at 1pm.  She has been advised to continue to stay well hydrated even as she continues her Golytely.  Thanks [employer]

## 2018-10-18 NOTE — Telephone Encounter (Signed)
Pt left vm she has a procedure in the morning and has a question about a prep

## 2018-10-19 ENCOUNTER — Encounter
Admission: RE | Disposition: A | Discharge status: Home / Self Care | Payer: Self-pay | Source: Home / Self Care | Attending: Gastroenterology

## 2018-10-19 ENCOUNTER — Encounter: Payer: Self-pay | Admitting: *Deleted

## 2018-10-19 ENCOUNTER — Ambulatory Visit: Payer: BC Managed Care – PPO | Admitting: Anesthesiology

## 2018-10-19 ENCOUNTER — Other Ambulatory Visit: Payer: Self-pay

## 2018-10-19 ENCOUNTER — Ambulatory Visit
Admission: RE | Admit: 2018-10-19 | Discharge: 2018-10-19 | Disposition: A | Discharge status: Home / Self Care | Payer: BC Managed Care – PPO | Attending: Gastroenterology | Admitting: Gastroenterology

## 2018-10-19 DIAGNOSIS — K5730 Diverticulosis of large intestine without perforation or abscess without bleeding: Secondary | ICD-10-CM | POA: Insufficient documentation

## 2018-10-19 DIAGNOSIS — K640 First degree hemorrhoids: Secondary | ICD-10-CM | POA: Diagnosis not present

## 2018-10-19 DIAGNOSIS — Z79899 Other long term (current) drug therapy: Secondary | ICD-10-CM | POA: Insufficient documentation

## 2018-10-19 DIAGNOSIS — Z1211 Encounter for screening for malignant neoplasm of colon: Secondary | ICD-10-CM | POA: Diagnosis not present

## 2018-10-19 DIAGNOSIS — Z87891 Personal history of nicotine dependence: Secondary | ICD-10-CM | POA: Insufficient documentation

## 2018-10-19 HISTORY — PX: COLONOSCOPY WITH PROPOFOL: SHX5780

## 2018-10-19 SURGERY — COLONOSCOPY WITH PROPOFOL
Anesthesia: General

## 2018-10-19 MED ORDER — PROPOFOL 500 MG/50ML IV EMUL
INTRAVENOUS | Status: DC | PRN
  Administered 2018-10-19: 175 ug/kg/min via INTRAVENOUS

## 2018-10-19 MED ORDER — SODIUM CHLORIDE 0.9 % IV SOLN
INTRAVENOUS | Status: DC
  Administered 2018-10-19: 1000 mL via INTRAVENOUS

## 2018-10-19 MED ORDER — PROPOFOL 500 MG/50ML IV EMUL
INTRAVENOUS | Status: AC
  Filled 2018-10-19: qty 50

## 2018-10-19 MED ORDER — MIDAZOLAM HCL 2 MG/2ML IJ SOLN
INTRAMUSCULAR | Status: AC
  Filled 2018-10-19: qty 2

## 2018-10-19 MED ORDER — MIDAZOLAM HCL 2 MG/2ML IJ SOLN
INTRAMUSCULAR | Status: DC | PRN
  Administered 2018-10-19: 2 mg via INTRAVENOUS

## 2018-10-19 MED ORDER — PROPOFOL 10 MG/ML IV BOLUS
INTRAVENOUS | Status: DC | PRN
  Administered 2018-10-19: 50 mg via INTRAVENOUS

## 2018-10-19 MED ORDER — LIDOCAINE HCL (CARDIAC) PF 100 MG/5ML IV SOSY
PREFILLED_SYRINGE | INTRAVENOUS | Status: DC | PRN
  Administered 2018-10-19: 50 mg via INTRAVENOUS

## 2018-10-19 NOTE — Anesthesia Preprocedure Evaluation (Signed)
Anesthesia Evaluation  Patient identified by MRN, date of birth, ID band Patient awake    Reviewed: Allergy & Precautions, NPO status , Patient's Chart, lab work & pertinent test results  History of Anesthesia Complications Negative for: history of anesthetic complications  Airway Mallampati: II       Dental   Pulmonary neg sleep apnea, neg COPD, Not current smoker, former smoker,           Cardiovascular (-) hypertension(-) Past MI and (-) CHF (-) dysrhythmias (-) Valvular Problems/Murmurs     Neuro/Psych neg Seizures Anxiety    GI/Hepatic Neg liver ROS, neg GERD  ,  Endo/Other  neg diabetes  Renal/GU negative Renal ROS     Musculoskeletal   Abdominal   Peds  Hematology   Anesthesia Other Findings   Reproductive/Obstetrics                             Anesthesia Physical Anesthesia Plan  ASA: II  Anesthesia Plan: General   Post-op Pain Management:    Induction: Intravenous  PONV Risk Score and Plan: 3 and Propofol infusion, TIVA and Ondansetron  Airway Management Planned:   Additional Equipment:   Intra-op Plan:   Post-operative Plan:   Informed Consent: I have reviewed the patients History and Physical, chart, labs and discussed the procedure including the risks, benefits and alternatives for the proposed anesthesia with the patient or authorized representative who has indicated his/her understanding and acceptance.       Plan Discussed with:   Anesthesia Plan Comments:         Anesthesia Quick Evaluation

## 2018-10-19 NOTE — Anesthesia Post-op Follow-up Note (Signed)
Anesthesia QCDR form completed.        

## 2018-10-19 NOTE — Transfer of Care (Signed)
Immediate Anesthesia Transfer of Care Note  Patient: Ananda Dupre  Procedure(s) Performed: COLONOSCOPY WITH PROPOFOL (N/A )  Patient Location: PACU  Anesthesia Type:General  Level of Consciousness: drowsy  Airway & Oxygen Therapy: Patient Spontanous Breathing and Patient connected to nasal cannula oxygen  Post-op Assessment: Report given to RN and Post -op Vital signs reviewed and stable  Post vital signs: Reviewed and stable  Last Vitals:  Vitals Value Taken Time  BP 124/74 10/19/18 0927  Temp    Pulse 69 10/19/18 0927  Resp 18 10/19/18 0927  SpO2 100 % 10/19/18 0927  Vitals shown include unvalidated device data.  Last Pain:  Vitals:   10/19/18 0803  TempSrc: Tympanic  PainSc: 0-No pain         Complications: No apparent anesthesia complications

## 2018-10-19 NOTE — Anesthesia Postprocedure Evaluation (Signed)
Anesthesia Post Note  Patient: Amber Hayes  Procedure(s) Performed: COLONOSCOPY WITH PROPOFOL (N/A )  Patient location during evaluation: Endoscopy Anesthesia Type: General Level of consciousness: awake and alert Pain management: pain level controlled Vital Signs Assessment: post-procedure vital signs reviewed and stable Respiratory status: spontaneous breathing and respiratory function stable Cardiovascular status: stable Anesthetic complications: no     Last Vitals:  Vitals:   10/19/18 0930 10/19/18 0940  BP: 124/74 126/79  Pulse: 67 (!) 58  Resp: 18 17  Temp:    SpO2: 100% 100%    Last Pain:  Vitals:   10/19/18 0920  TempSrc: Tympanic  PainSc:                  Future Yeldell K

## 2018-10-19 NOTE — Op Note (Signed)
[facility] Gastroenterology Patient Name: Amber Hayes Procedure Date: 10/19/2018 9:02 AM MRN: [id number] Account #: [id number] Date of Birth: 03/08/1955 Admit Type: Outpatient Age: [age] [facility]OM 4 Gender: Female Note Status: Finalized Procedure:            Colonoscopy Indications:          Screening for colorectal malignant neoplasm Providers:            Wyline MoodKiran Eulan Heyward MD, MD Referring MD:         Marzella SchleinAngela M. Bacigalupo (Referring MD) Medicines:            Monitored Anesthesia Care Complications:        No immediate complications. Procedure:            Pre-Anesthesia Assessment:                       - Prior to the procedure, a History and Physical was                        performed, and patient medications, allergies and                        sensitivities were reviewed. The patient's tolerance of                        previous anesthesia was reviewed.                       - The risks and benefits of the procedure and the                        sedation options and risks were discussed with the                        patient. All questions were answered and informed                        consent was obtained.                       - ASA Grade Assessment: II - A patient with mild                        systemic disease.                       After obtaining informed consent, the colonoscope was                        passed under direct vision. Throughout the procedure,                        the patient's blood pressure, pulse, and oxygen                        saturations were monitored continuously. The                        Colonoscope was introduced through the anus and  advanced to the the cecum, identified by the                        appendiceal orifice. The colonoscopy was performed with                        ease. The patient tolerated the procedure well. The                        quality of the bowel preparation  was good. Findings:      The perianal and digital rectal examinations were normal.      Multiple small-mouthed diverticula were found in the sigmoid colon.      Non-bleeding internal hemorrhoids were found during retroflexion. The       hemorrhoids were medium-sized and Grade I (internal hemorrhoids that do       not prolapse).      The entire examined colon appeared normal on direct and retroflexion       views. Impression:           - Diverticulosis in the sigmoid colon.                       - Non-bleeding internal hemorrhoids.                       - The entire examined colon is normal on direct and                        retroflexion views.                       - No specimens collected. Recommendation:       - Discharge patient to home (with escort).                       - Resume previous diet.                       - Continue present medications.                       - Repeat colonoscopy in 10 years for screening purposes. Procedure Code(s):    --- Professional ---                       [phone], Colonoscopy, flexible; diagnostic, including                        collection of specimen(s) by brushing or washing, when                        performed (separate procedure) Diagnosis Code(s):    --- Professional ---                       Z12.11, Encounter for screening for malignant neoplasm                        of colon                       K57.30, Diverticulosis of large intestine without  perforation or abscess without bleeding                       K64.0, First degree hemorrhoids CPT copyright 2019 American Medical Association. All rights reserved. The codes documented in this report are preliminary and upon coder review may  be revised to meet current compliance requirements. Jonathon Bellows, MD Jonathon Bellows MD, MD 10/19/2018 9:24:37 AM This report has been signed electronically. Number of Addenda: 0 Note Initiated On: 10/19/2018 9:02 AM Scope Withdrawal  Time: 0 hours 10 minutes 38 seconds  Total Procedure Duration: 0 hours 15 minutes 54 seconds  Estimated Blood Loss: Estimated blood loss: none.      [facility]

## 2018-10-19 NOTE — H&P (Signed)
Jonathon Bellows, MD [address], [place], [place], [place], [address] [address], [place], [place], [place], [address] Phone: [phone]  Fax: [phone]  Primary Care Physician:  Virginia Crews, MD   Pre-Procedure History & Physical: HPI:  Amber Hayes is a 63 y.o. female is here for an colonoscopy.   History reviewed. No pertinent past medical history.  Past Surgical History:  Procedure Laterality Date  . AUGMENTATION MAMMAPLASTY Bilateral 2003  . EYE SURGERY  1999   Lasik  . EYE SURGERY  2017   Torn Retina    Prior to Admission medications   Medication Sig Start Date End Date Taking? Authorizing Provider  citalopram (CELEXA) 10 MG tablet Take 3 tablets (30 mg total) by mouth daily. 02/08/18  Yes Bacigalupo, Dionne Bucy, MD  progesterone (PROMETRIUM) 100 MG capsule Take 1 capsule by mouth once daily at bedtime. 04/05/18  Yes Bacigalupo, Dionne Bucy, MD  NONFORMULARY OR COMPOUNDED ITEM Hormone cream insert    [provider]  Probiotic Product (PROBIOTIC DAILY PO) Take by mouth.    [provider]    Allergies as of 09/24/2018  . (No Known Allergies)    Family History  Problem Relation Age of Onset  . Alzheimer's disease Mother   . Heart attack Father   . Hypertension Sister   . Bronchitis Sister   . Neurologic Disorder Sister        global transient amnesia x 2    Social History   Socioeconomic History  . Marital status: Married    Spouse name: Gerald Stabs  . Number of children: 2  . Years of education: undergrad  . Highest education level: Not on file  Occupational History  . 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 Financial resource strain: Not on file  . Food insecurity    Worry: Not on file    Inability: Not on file  . Transportation needs    Medical: Not on file    Non-medical: Not on file  Tobacco Use  . Smoking status:  Former Smoker    Packs/day: 0.50    Years: 15.00    Pack years: 7.50    Quit date: 01/24/2004    Years since quitting: 14.7  . Smokeless tobacco: Never Used  Substance and Sexual Activity  . Alcohol use: No  . Drug use: No  . Sexual activity: Yes    Birth control/protection: Post-menopausal  Lifestyle  . Physical activity    Days per week: Not on file    Minutes per session: Not on file  . Stress: Not on file  Relationships  . Social Herbalist on phone: Not on file    Gets together: Not on file    Attends religious service: Not on file    Active member of club or organization: Not on file    Attends meetings of clubs or organizations: Not on file    Relationship status: Not on file  . Intimate partner violence    Fear of current or ex partner: Not on file    Emotionally abused: Not on file    Physically abused: Not on file    Forced sexual activity: Not on file  Other Topics Concern  . Not on file  Social History Narrative  . Not on file    Review of Systems: See HPI, otherwise negative ROS  Physical Exam: BP 130/78   Pulse 75   Temp (!) 96.8 F (36 C) (Tympanic)   Resp 16   Ht 5\' 8"  (1.727 m)   Wt 57.2 kg   SpO2 99%   BMI 19.16 kg/m  General:   Alert,  pleasant and cooperative in NAD Head:  Normocephalic and atraumatic. Neck:  Supple; no masses or thyromegaly. Lungs:  Clear throughout to auscultation, normal respiratory effort.    Heart:  +S1, +S2, Regular rate and rhythm, No edema. Abdomen:  Soft, nontender and nondistended. Normal bowel sounds, without guarding, and without rebound.   Neurologic:  Alert and  oriented x4;  grossly normal neurologically.  Impression/Plan: Amber Hayes is here for an colonoscopy to be performed for Screening colonoscopy average risk   Risks, benefits, limitations, and alternatives regarding  colonoscopy have been reviewed with the patient.  Questions have been answered.  All parties agreeable.   Caryn Bee,  MD  10/19/2018, 8:51 AM

## 2018-10-20 NOTE — Progress Notes (Signed)
Voicemail. No message left.

## 2018-10-22 ENCOUNTER — Encounter: Payer: Self-pay | Admitting: Gastroenterology

## 2018-10-29 DIAGNOSIS — H2513 Age-related nuclear cataract, bilateral: Secondary | ICD-10-CM | POA: Diagnosis not present

## 2018-10-30 ENCOUNTER — Other Ambulatory Visit: Payer: Self-pay | Admitting: Family Medicine

## 2018-10-30 DIAGNOSIS — Z1231 Encounter for screening mammogram for malignant neoplasm of breast: Secondary | ICD-10-CM

## 2018-11-01 ENCOUNTER — Ambulatory Visit
Admission: RE | Admit: 2018-11-01 | Discharge: 2018-11-01 | Disposition: A | Discharge status: Home / Self Care | Payer: BC Managed Care – PPO | Source: Ambulatory Visit | Attending: Family Medicine | Admitting: Family Medicine

## 2018-11-01 ENCOUNTER — Other Ambulatory Visit: Payer: Self-pay | Admitting: Family Medicine

## 2018-11-01 DIAGNOSIS — Z1231 Encounter for screening mammogram for malignant neoplasm of breast: Secondary | ICD-10-CM

## 2018-11-08 ENCOUNTER — Ambulatory Visit (INDEPENDENT_AMBULATORY_CARE_PROVIDER_SITE_OTHER): Payer: BC Managed Care – PPO | Admitting: Family Medicine

## 2018-11-08 ENCOUNTER — Encounter: Payer: Self-pay | Admitting: Family Medicine

## 2018-11-08 ENCOUNTER — Other Ambulatory Visit: Payer: Self-pay

## 2018-11-08 VITALS — BP 130/75 | HR 56 | Temp 96.8°F | Ht 68.0 in | Wt 128.8 lb

## 2018-11-08 DIAGNOSIS — Z0000 Encounter for general adult medical examination without abnormal findings: Secondary | ICD-10-CM

## 2018-11-08 DIAGNOSIS — E7800 Pure hypercholesterolemia, unspecified: Secondary | ICD-10-CM | POA: Diagnosis not present

## 2018-11-08 DIAGNOSIS — Z780 Asymptomatic menopausal state: Secondary | ICD-10-CM

## 2018-11-08 DIAGNOSIS — F419 Anxiety disorder, unspecified: Secondary | ICD-10-CM

## 2018-11-08 DIAGNOSIS — Z23 Encounter for immunization: Secondary | ICD-10-CM | POA: Diagnosis not present

## 2018-11-08 NOTE — Assessment & Plan Note (Signed)
Well controlled Continue celexa 30mg  daily

## 2018-11-08 NOTE — Progress Notes (Signed)
Patient: Amber Hayes, Female    DOB: [birth year], 63 y.o.   MRN: [id number] Visit Date: 11/08/2018  Today's Provider: Shirlee Latch, MD   Chief Complaint  Patient presents with  . Annual Exam   Subjective:    I, Porsha McClurkin CMA, am acting as a scribe for Shirlee Latch, MD.     Annual physical exam Amber Hayes is a 63 y.o. female who presents today for health maintenance and complete physical. She feels well. She reports exercising includes walking. She reports she is sleeping well.  ----------------------------------------------------------------- Last Pap:07/04/2016 Last mammogram:11/01/2018 Last colonoscopy:10/19/2018  Reports celexa is controlling anxiety well  Review of Systems  Constitutional: Negative.   HENT: Negative.   Eyes: Negative.   Respiratory: Negative.   Cardiovascular: Negative.   Gastrointestinal: Negative.   Endocrine: Negative.   Genitourinary: Negative.   Musculoskeletal: Negative.   Skin: Negative.   Allergic/Immunologic: Negative.   Neurological: Negative.   Hematological: Negative.   Psychiatric/Behavioral: Negative.     Social History      She  reports that she quit smoking about 14 years ago. She has a 7.50 pack-year smoking history. She has never used smokeless tobacco. She reports that she does not drink alcohol or use drugs.       Social History   Socioeconomic History  . Marital status: Married    Spouse name: Thayer Ohm  . Number of children: 2  . Years of education: undergrad  . Highest education level: Not on file  Occupational History  . 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 Financial resource strain: Not on file  . Food insecurity    Worry: Not on file    Inability: Not on file  . Transportation needs    Medical: Not on file    Non-medical: Not on file  Tobacco Use  . Smoking status: Former Smoker   Packs/day: 0.50    Years: 15.00    Pack years: 7.50    Quit date: 01/24/2004    Years since quitting: 14.8  . Smokeless tobacco: Never Used  Substance and Sexual Activity  . Alcohol use: No  . Drug use: No  . Sexual activity: Yes    Birth control/protection: Post-menopausal  Lifestyle  . Physical activity    Days per week: Not on file    Minutes per session: Not on file  . Stress: Not on file  Relationships  . Social Musician on phone: Not on file    Gets together: Not on file    Attends religious service: Not on file    Active member of club or organization: Not on file    Attends meetings of clubs or organizations: Not on file    Relationship status: Not on file  Other Topics Concern  . Not on file  Social History Narrative  . Not on file    No past medical history on file.   Patient Active Problem List   Diagnosis Date Noted  . Panic attack 06/23/2017  . Cystocele without uterine prolapse 12/29/2016  . Urinary frequency 12/28/2016  . Vaginal atrophy 11/18/2016  . Allergic rhinitis 05/14/2015  . Blood pressure elevated without history of HTN 05/14/2015  . Decreased libido 05/14/2015  . Abnormal bruising 05/14/2015  . Pure hypercholesterolemia 05/14/2015  . Anxiety 02/16/2015  . Atypical chest pain 07/10/2009  . Alcohol use 12/05/2007  Past Surgical History:  Procedure Laterality Date  . AUGMENTATION MAMMAPLASTY Bilateral 2003  . COLONOSCOPY WITH PROPOFOL N/A 10/19/2018   Procedure: COLONOSCOPY WITH PROPOFOL;  Surgeon: Jonathon Bellows, MD;  Location: [facility] ENDOSCOPY;  Service: Gastroenterology;  Laterality: N/A;  . EYE SURGERY  1999   Lasik  . EYE SURGERY  2017   Torn Retina    Family History        Family Status  Relation Name Status  . Mother  Deceased at age 58  . Father  Deceased at age 42       MI  . Sister  Alive  . Other niece (Not Specified)        Her family history includes Alzheimer's disease in her mother; Breast cancer in an  other family member; Bronchitis in her sister; Heart attack in her father; Hypertension in her sister; Neurologic Disorder in her sister.      No Known Allergies   Current Outpatient Medications:  .  citalopram (CELEXA) 10 MG tablet, Take 3 tablets (30 mg total) by mouth daily., Disp: 270 tablet, Rfl: 2 .  Probiotic Product (PROBIOTIC DAILY PO), Take by mouth., Disp: , Rfl:  .  progesterone (PROMETRIUM) 100 MG capsule, Take 1 capsule by mouth once daily at bedtime. (Patient not taking: Reported on 11/08/2018), Disp: 30 capsule, Rfl: PRN   Patient Care Team: Virginia Crews, MD as PCP - General (Family Medicine)    Objective:    Vitals: BP (!) 156/85 (BP Location: Left Arm, Patient Position: Sitting, Cuff Size: Normal)   Pulse (!) 56   Temp (!) 96.8 F (36 C) (Temporal)   Ht 5\' 8"  (1.727 m)   Wt 128 lb 12.8 oz (58.4 kg)   SpO2 99%   BMI 19.58 kg/m    Vitals:   11/08/18 1110  BP: (!) 156/85  Pulse: (!) 56  Temp: (!) 96.8 F (36 C)  TempSrc: Temporal  SpO2: 99%  Weight: 128 lb 12.8 oz (58.4 kg)  Height: 5\' 8"  (1.727 m)     Physical Exam Vitals signs reviewed.  Constitutional:      General: She is not in acute distress.    Appearance: Normal appearance. She is well-developed. She is not diaphoretic.  HENT:     Head: Normocephalic and atraumatic.     Right Ear: Tympanic membrane, ear canal and external ear normal.     Left Ear: Tympanic membrane, ear canal and external ear normal.  Eyes:     General: No scleral icterus.    Conjunctiva/sclera: Conjunctivae normal.     Pupils: Pupils are equal, round, and reactive to light.  Neck:     Musculoskeletal: Neck supple.     Thyroid: No thyromegaly.  Cardiovascular:     Rate and Rhythm: Normal rate and regular rhythm.     Pulses: Normal pulses.     Heart sounds: Normal heart sounds. No murmur.  Pulmonary:     Effort: Pulmonary effort is normal. No respiratory distress.     Breath sounds: Normal breath sounds. No  wheezing or rales.  Abdominal:     General: There is no distension.     Palpations: Abdomen is soft.     Tenderness: There is no abdominal tenderness.  Musculoskeletal:        General: No deformity.     Right lower leg: No edema.     Left lower leg: No edema.  Lymphadenopathy:     Cervical: No cervical adenopathy.  Skin:  General: Skin is warm and dry.     Capillary Refill: Capillary refill takes less than 2 seconds.     Findings: No rash.  Neurological:     Mental Status: She is alert and oriented to person, place, and time. Mental status is at baseline.     Gait: Gait normal.  Psychiatric:        Mood and Affect: Mood normal.        Behavior: Behavior normal.        Thought Content: Thought content normal.      Depression Screen PHQ 2/9 Scores 02/08/2018 11/06/2017 08/04/2017 07/04/2016  PHQ - 2 Score 0 0 0 0  PHQ- 9 Score 1 4 4 3      Assessment & Plan:     Routine Health Maintenance and Physical Exam  Exercise Activities and Dietary recommendations Goals   None     Immunization History  Administered Date(s) Administered  . Td 07/13/2013  . Tdap 07/13/2013    Health Maintenance  Topic Date Due  . HIV Screening  07/30/1968  . DEXA SCAN  07/31/2018  . PNA vac Low Risk Adult (1 of 2 - PCV13) 07/31/2018  . INFLUENZA VACCINE  08/25/2018  . PAP SMEAR-Modifier  07/05/2019  . MAMMOGRAM  10/31/2020  . TETANUS/TDAP  07/14/2023  . COLONOSCOPY  10/18/2028  . Hepatitis C Screening  Completed     Discussed health benefits of physical activity, and encouraged her to engage in regular exercise appropriate for her age and condition.    --------------------------------------------------------------------  Problem List Items Addressed This Visit      Other   Anxiety    Well controlled Continue celexa 30mg  daily      Relevant Orders   CBC   Pure hypercholesterolemia   Relevant Orders   Comprehensive metabolic panel   Lipid panel    Other Visit Diagnoses     Encounter for annual physical exam    -  Primary   Relevant Orders   DG Bone Density   Comprehensive metabolic panel   Lipid panel   CBC   Postmenopausal       Relevant Orders   DG Bone Density   Need for 23-polyvalent pneumococcal polysaccharide vaccine       Relevant Orders   Pneumococcal polysaccharide vaccine 23-valent greater than or equal to 2yo subcutaneous/IM (Completed)       Return in about 1 year (around 11/08/2019).   The entirety of the information documented in the History of Present Illness, Review of Systems and Physical Exam were personally obtained by me. Portions of this information were initially documented by [facility] McClurkin, CMA and reviewed by me for thoroughness and accuracy.    , Marzella SchleinAngela M, MD MPH [facility]

## 2018-11-08 NOTE — Patient Instructions (Signed)
 The CDC recommends two doses of Shingrix (the shingles vaccine) separated by 2 to 6 months for adults age 65 years and older. I recommend checking with your insurance plan regarding coverage for this vaccine.     Preventive Care 65 Years and Older, Female Preventive care refers to lifestyle choices and visits with your health care provider that can promote health and wellness. This includes:  A yearly physical exam. This is also called an annual well check.  Regular dental and eye exams.  Immunizations.  Screening for certain conditions.  Healthy lifestyle choices, such as diet and exercise. What can I expect for my preventive care visit? Physical exam Your health care provider will check:  Height and weight. These may be used to calculate body mass index (BMI), which is a measurement that tells if you are at a healthy weight.  Heart rate and blood pressure.  Your skin for abnormal spots. Counseling Your health care provider may ask you questions about:  Alcohol, tobacco, and drug use.  Emotional well-being.  Home and relationship well-being.  Sexual activity.  Eating habits.  History of falls.  Memory and ability to understand (cognition).  Work and work environment.  Pregnancy and menstrual history. What immunizations do I need?  Influenza (flu) vaccine  This is recommended every year. Tetanus, diphtheria, and pertussis (Tdap) vaccine  You may need a Td booster every 10 years. Varicella (chickenpox) vaccine  You may need this vaccine if you have not already been vaccinated. Zoster (shingles) vaccine  You may need this after age 60. Pneumococcal conjugate (PCV13) vaccine  One dose is recommended after age 65. Pneumococcal polysaccharide (PPSV23) vaccine  One dose is recommended after age 65. Measles, mumps, and rubella (MMR) vaccine  You may need at least one dose of MMR if you were born in 1957 or later. You may also need a second  dose. Meningococcal conjugate (MenACWY) vaccine  You may need this if you have certain conditions. Hepatitis A vaccine  You may need this if you have certain conditions or if you travel or work in places where you may be exposed to hepatitis A. Hepatitis B vaccine  You may need this if you have certain conditions or if you travel or work in places where you may be exposed to hepatitis B. Haemophilus influenzae type b (Hib) vaccine  You may need this if you have certain conditions. You may receive vaccines as individual doses or as more than one vaccine together in one shot (combination vaccines). Talk with your health care provider about the risks and benefits of combination vaccines. What tests do I need? Blood tests  Lipid and cholesterol levels. These may be checked every 5 years, or more frequently depending on your overall health.  Hepatitis C test.  Hepatitis B test. Screening  Lung cancer screening. You may have this screening every year starting at age 55 if you have a 30-pack-year history of smoking and currently smoke or have quit within the past 15 years.  Colorectal cancer screening. All adults should have this screening starting at age 65 and continuing until age 75. Your health care provider may recommend screening at age 45 if you are at increased risk. You will have tests every 1-10 years, depending on your results and the type of screening test.  Diabetes screening. This is done by checking your blood sugar (glucose) after you have not eaten for a while (fasting). You may have this done every 1-3 years.  Mammogram. This may   be done every 1-2 years. Talk with your health care provider about how often you should have regular mammograms.  BRCA-related cancer screening. This may be done if you have a family history of breast, ovarian, tubal, or peritoneal cancers. Other tests  Sexually transmitted disease (STD) testing.  Bone density scan. This is done to screen for  osteoporosis. You may have this done starting at age 65. Follow these instructions at home: Eating and drinking  Eat a diet that includes fresh fruits and vegetables, whole grains, lean protein, and low-fat dairy products. Limit your intake of foods with high amounts of sugar, saturated fats, and salt.  Take vitamin and mineral supplements as recommended by your health care provider.  Do not drink alcohol if your health care provider tells you not to drink.  If you drink alcohol: ? Limit how much you have to 0-1 drink a day. ? Be aware of how much alcohol is in your drink. In the [place] one drink equals one 12 oz bottle of beer (355 mL), one 5 oz glass of wine (148 mL), or one 1 oz glass of hard liquor (44 mL). Lifestyle  Take daily care of your teeth and gums.  Stay active. Exercise for at least 30 minutes on 5 or more days each week.  Do not use any products that contain nicotine or tobacco, such as cigarettes, e-cigarettes, and chewing tobacco. If you need help quitting, ask your health care provider.  If you are sexually active, practice safe sex. Use a condom or other form of protection in order to prevent STIs (sexually transmitted infections).  Talk with your health care provider about taking a low-dose aspirin or statin. What's next?  Go to your health care provider once a year for a well check visit.  Ask your health care provider how often you should have your eyes and teeth checked.  Stay up to date on all vaccines. This information is not intended to replace advice given to you by your health care provider. Make sure you discuss any questions you have with your health care provider. Document Released: 02/06/2015 Document Revised: 01/04/2018 Document Reviewed: 01/04/2018 Elsevier Patient Education  2020 [employer]  

## 2018-11-13 ENCOUNTER — Ambulatory Visit: Payer: Self-pay

## 2018-11-13 DIAGNOSIS — E7800 Pure hypercholesterolemia, unspecified: Secondary | ICD-10-CM | POA: Diagnosis not present

## 2018-11-13 DIAGNOSIS — Z0000 Encounter for general adult medical examination without abnormal findings: Secondary | ICD-10-CM | POA: Diagnosis not present

## 2018-11-13 DIAGNOSIS — F419 Anxiety disorder, unspecified: Secondary | ICD-10-CM | POA: Diagnosis not present

## 2018-11-14 ENCOUNTER — Telehealth: Payer: Self-pay

## 2018-11-14 LAB — COMPREHENSIVE METABOLIC PANEL
ALT: 14 IU/L (ref 0–32)
AST: 21 IU/L (ref 0–40)
Albumin/Globulin Ratio: 2.1 (ref 1.2–2.2)
Albumin: 4 g/dL (ref 3.8–4.8)
Alkaline Phosphatase: 108 IU/L (ref 39–117)
BUN/Creatinine Ratio: 17 (ref 12–28)
BUN: 14 mg/dL (ref 8–27)
Bilirubin Total: 0.4 mg/dL (ref 0.0–1.2)
CO2: 24 mmol/L (ref 20–29)
Calcium: 9 mg/dL (ref 8.7–10.3)
Chloride: 104 mmol/L (ref 96–106)
Creatinine, Ser: 0.84 mg/dL (ref 0.57–1.00)
GFR calc Af Amer: 84 mL/min/{1.73_m2} (ref >59)
GFR calc non Af Amer: 73 mL/min/{1.73_m2} (ref >59)
Globulin, Total: 1.9 g/dL (ref 1.5–4.5)
Glucose: 84 mg/dL (ref 65–99)
Potassium: 3.9 mmol/L (ref 3.5–5.2)
Sodium: 141 mmol/L (ref 134–144)
Total Protein: 5.9 g/dL — ABNORMAL LOW (ref 6.0–8.5)

## 2018-11-14 LAB — CBC
Hematocrit: 39.8 % (ref 34.0–46.6)
Hemoglobin: 12.6 g/dL (ref 11.1–15.9)
MCH: 25.3 pg — ABNORMAL LOW (ref 26.6–33.0)
MCHC: 31.7 g/dL (ref 31.5–35.7)
MCV: 80 fL (ref 79–97)
Platelets: 205 10*3/uL (ref 150–450)
RBC: 4.98 x10E6/uL (ref 3.77–5.28)
RDW: 18 % — ABNORMAL HIGH (ref 11.7–15.4)
WBC: 4.4 10*3/uL (ref 3.4–10.8)

## 2018-11-14 LAB — LIPID PANEL
Chol/HDL Ratio: 2.8 ratio (ref 0.0–4.4)
Cholesterol, Total: 196 mg/dL (ref 100–199)
HDL: 70 mg/dL (ref >39)
LDL Chol Calc (NIH): 117 mg/dL — ABNORMAL HIGH (ref 0–99)
Triglycerides: 49 mg/dL (ref 0–149)
VLDL Cholesterol Cal: 9 mg/dL (ref 5–40)

## 2018-11-14 NOTE — Telephone Encounter (Signed)
LMTCB

## 2018-11-14 NOTE — Telephone Encounter (Signed)
Patient advised.

## 2018-11-14 NOTE — Telephone Encounter (Signed)
-----   Message from Virginia Crews, MD sent at 11/14/2018  1:36 PM EDT ----- Normal labs. Cholesterol is better than it was when last checked.  I recommend diet low in saturated fat and regular exercise - 30 min at least 5 times per week

## 2018-12-03 DIAGNOSIS — H35371 Puckering of macula, right eye: Secondary | ICD-10-CM | POA: Diagnosis not present

## 2018-12-03 DIAGNOSIS — H2513 Age-related nuclear cataract, bilateral: Secondary | ICD-10-CM | POA: Diagnosis not present

## 2018-12-04 ENCOUNTER — Ambulatory Visit
Admission: RE | Admit: 2018-12-04 | Discharge: 2018-12-04 | Disposition: A | Discharge status: Home / Self Care | Payer: BC Managed Care – PPO | Source: Ambulatory Visit | Attending: Family Medicine | Admitting: Family Medicine

## 2018-12-04 ENCOUNTER — Encounter: Payer: Self-pay | Admitting: Family Medicine

## 2018-12-04 DIAGNOSIS — Z780 Asymptomatic menopausal state: Secondary | ICD-10-CM | POA: Diagnosis not present

## 2018-12-04 DIAGNOSIS — Z0000 Encounter for general adult medical examination without abnormal findings: Secondary | ICD-10-CM | POA: Insufficient documentation

## 2018-12-04 DIAGNOSIS — M810 Age-related osteoporosis without current pathological fracture: Secondary | ICD-10-CM | POA: Diagnosis not present

## 2018-12-05 ENCOUNTER — Encounter: Payer: Self-pay | Admitting: Family Medicine

## 2018-12-05 ENCOUNTER — Telehealth: Payer: Self-pay

## 2018-12-05 NOTE — Telephone Encounter (Signed)
-----   Message from Virginia Crews, MD sent at 12/05/2018  2:14 PM EST ----- Bone density scan shows osteoporosis in the spine and femur.  Patient's risk of fracture is high.  I would recommend a medication to help with bone mass.  She should optimize her calcium and vitamin D intake.  Recommend 1200 mg of calcium and 2000 units of vitamin D3 daily.  Fosamax may be a good option for her for first-line, but if she would like to consider other medications, a referral to endocrinology may also be helpful because she may be a good candidate for Prolia.

## 2018-12-05 NOTE — Telephone Encounter (Signed)
Patient advised as below. Patient reports she will think about her options and give [place] a call.

## 2018-12-10 ENCOUNTER — Encounter: Payer: Self-pay | Admitting: Family Medicine

## 2018-12-10 ENCOUNTER — Telehealth (INDEPENDENT_AMBULATORY_CARE_PROVIDER_SITE_OTHER): Payer: BC Managed Care – PPO | Admitting: Family Medicine

## 2018-12-10 DIAGNOSIS — M810 Age-related osteoporosis without current pathological fracture: Secondary | ICD-10-CM | POA: Diagnosis not present

## 2018-12-10 NOTE — Progress Notes (Signed)
Patient: Amber Hayes Female    DOB: December 15, 1955   63 y.o.   MRN: [id number] Visit Date: 12/10/2018  Today's Provider: Shirlee Latch, MD   Chief Complaint  Patient presents with  . Follow-up   Subjective:    Virtual Visit via Video Note  I connected with Amber Hayes on 12/10/18 at  1:20 PM EST by a video enabled telemedicine application and verified that I am speaking with the correct person using two identifiers.   Patient location: home Provider location: [facility] Persons involved in the visit: patient, provider   I discussed the limitations of evaluation and management by telemedicine and the availability of in person appointments. The patient expressed understanding and agreed to proceed.   HPI Patient following up on bone density scan.  Bone density scan completed for screening purposes on 12/04/2018 and showed T score in lumbar spine of -3.6 and T score in the right femoral neck of -2.9.  She has no former results to compare to.  States that her mother had significant osteoporosis and her sister has osteopenia.  Since being notified of these results, she has started taking an OTC calcium and vitamin D supplement.  She has never taken any treatment for osteoporosis.  She has heard about Fosamax in the context of her mother and worries about the possible side effect of osteonecrosis of the jaw as she has had to have 2 bone grafts for dental implants already.  She has no recent fractures   No Known Allergies   Current Outpatient Medications:  .  Calcium Carb-Cholecalciferol (CALCIUM 1000 + D PO), Take by mouth., Disp: , Rfl:  .  citalopram (CELEXA) 10 MG tablet, Take 3 tablets (30 mg total) by mouth daily., Disp: 270 tablet, Rfl: 2 .  VITAMIN D PO, Take by mouth., Disp: , Rfl:  .  Probiotic Product (PROBIOTIC DAILY PO), Take by mouth., Disp: , Rfl:   Review of Systems  Constitutional: Negative.   Respiratory: Negative.   Cardiovascular:  Negative.   Musculoskeletal: Negative.     Social History   Tobacco Use  . Smoking status: Former Smoker    Packs/day: 0.50    Years: 15.00    Pack years: 7.50    Quit date: 01/24/2004    Years since quitting: 14.8  . Smokeless tobacco: Never Used  Substance Use Topics  . Alcohol use: No      Objective:   There were no vitals taken for this visit. There were no vitals filed for this visit.There is no height or weight on file to calculate BMI.   Physical Exam Vitals signs reviewed.  Constitutional:      General: She is not in acute distress.    Appearance: Normal appearance. She is well-developed.  HENT:     Head: Normocephalic and atraumatic.  Pulmonary:     Effort: Pulmonary effort is normal. No respiratory distress.  Neurological:     Mental Status: She is alert and oriented to person, place, and time.  Psychiatric:        Behavior: Behavior normal.      No results found for any visits on 12/10/18.     Assessment & Plan      I discussed the assessment and treatment plan with the patient. The patient was provided an opportunity to ask questions and all were answered. The patient agreed with the plan and demonstrated an understanding of the instructions.   The patient was advised to  call back or seek an in-person evaluation if the symptoms worsen or if the condition fails to improve as anticipated.  I provided 25 minutes of non-face-to-face time during this encounter.  Problem List Items Addressed This Visit      Musculoskeletal and Integument   Age-related osteoporosis without current pathological fracture - Primary    New problem Discussed how we calculate her bone density and what the T score means Seems that genetics contributed to this given strong family history Advised weightbearing exercise 30 minutes a day at least 5 times a week Advised 1200 mg of calcium daily between diet and supplementation Check vitamin D level Advised 1000 units of vitamin  D3 daily Discussed Fosamax and bisphosphonates as her typical first-line for treatment of osteoporosis, but given her jaw and dental issues, I do see her concern for osteonecrosis of the jaw Will refer to endocrinology to discuss possibility of initiating Prolia as a first-line instead      Relevant Medications   Calcium Carb-Cholecalciferol (CALCIUM 1000 + D PO)   VITAMIN D PO   Other Relevant Orders   Ambulatory referral to Endocrinology   Vit D  25 hydroxy       Return if symptoms worsen or fail to improve.   The entirety of the information documented in the History of Present Illness, Review of Systems and Physical Exam were personally obtained by me. Portions of this information were initially documented by Lynford Humphrey, CMA and reviewed by me for thoroughness and accuracy.    , Dionne Bucy, MD MPH [facility]

## 2018-12-10 NOTE — Assessment & Plan Note (Signed)
New problem Discussed how we calculate her bone density and what the T score means Seems that genetics contributed to this given strong family history Advised weightbearing exercise 30 minutes a day at least 5 times a week Advised 1200 mg of calcium daily between diet and supplementation Check vitamin D level Advised 1000 units of vitamin D3 daily Discussed Fosamax and bisphosphonates as her typical first-line for treatment of osteoporosis, but given her jaw and dental issues, I do see her concern for osteonecrosis of the jaw Will refer to endocrinology to discuss possibility of initiating Prolia as a first-line instead

## 2018-12-10 NOTE — Patient Instructions (Signed)
Osteoporosis  Osteoporosis is thinning and loss of density in your bones. Osteoporosis makes bones more brittle and fragile and more likely to break (fracture). Over time, osteoporosis can cause your bones to become so weak that they fracture after a minor fall. Bones in the hip, wrist, and spine are most likely to fracture due to osteoporosis. What are the causes? The exact cause of this condition is not known. What increases the risk? You may be at greater risk for osteoporosis if you:  Have a family history of the condition.  Have poor nutrition.  Use steroid medicines, such as prednisone.  Are female.  Are age 54 or older.  Smoke or have a history of smoking.  Are not physically active (are sedentary).  Are white (Caucasian) or of Asian descent.  Have a small body frame.  Take certain medicines, such as antiseizure medicines. What are the signs or symptoms? A fracture might be the first sign of osteoporosis, especially if the fracture results from a fall or injury that usually would not cause a bone to break. Other signs and symptoms include:  Pain in the neck or low back.  Stooped posture.  Loss of height. How is this diagnosed? This condition may be diagnosed based on:  Your medical history.  A physical exam.  A bone mineral density test, also called a DXA or DEXA test (dual-energy X-ray absorptiometry test). This test uses X-rays to measure the amount of minerals in your bones. How is this treated? The goal of treatment is to strengthen your bones and lower your risk for a fracture. Treatment may involve:  Making lifestyle changes, such as: ? Including foods with more calcium and vitamin D in your diet. ? Doing weight-bearing and muscle-strengthening exercises. ? Stopping tobacco use. ? Limiting alcohol intake.  Taking medicine to slow the process of bone loss or to increase bone density.  Taking daily supplements of calcium and vitamin D.  Taking  hormone replacement medicines, such as estrogen for women and testosterone for men.  Monitoring your levels of calcium and vitamin D. Follow these instructions at home:  Activity  Exercise as told by your health care provider. Ask your health care provider what exercises and activities are safe for you. You should do: ? Exercises that make you work against gravity (weight-bearing exercises), such as tai chi, yoga, or walking. ? Exercises to strengthen muscles, such as lifting weights. Lifestyle  Limit alcohol intake to no more than 1 drink a day for nonpregnant women and 2 drinks a day for men. One drink equals 12 oz of beer, 5 oz of wine, or 1 oz of hard liquor.  Do not use any products that contain nicotine or tobacco, such as cigarettes and e-cigarettes. If you need help quitting, ask your health care provider. Preventing falls  Use devices to help you move around (mobility aids) as needed, such as canes, walkers, scooters, or crutches.  Keep rooms well-lit and clutter-free.  Remove tripping hazards from walkways, including cords and throw rugs.  Install grab bars in bathrooms and safety rails on stairs.  Use rubber mats in the bathroom and other areas that are often wet or slippery.  Wear closed-toe shoes that fit well and support your feet. Wear shoes that have rubber soles or low heels.  Review your medicines with your health care provider. Some medicines can cause dizziness or changes in blood pressure, which can increase your risk of falling. General instructions  Include calcium and vitamin D in  your diet. Calcium is important for bone health, and vitamin D helps your body to absorb calcium. Good sources of calcium and vitamin D include: ? Certain fatty fish, such as salmon and tuna. ? Products that have calcium and vitamin D added to them (fortified products), such as fortified cereals. ? Egg yolks. ? Cheese. ? Liver.  Take over-the-counter and prescription medicines  only as told by your health care provider.  Keep all follow-up visits as told by your health care provider. This is important. Contact a health care provider if:  You have never been screened for osteoporosis and you are: ? A woman who is age 61 or older. ? A man who is age 63 or older. Get help right away if:  You fall or injure yourself. Summary  Osteoporosis is thinning and loss of density in your bones. This makes bones more brittle and fragile and more likely to break (fracture),even with minor falls.  The goal of treatment is to strengthen your bones and reduce your risk for a fracture.  Include calcium and vitamin D in your diet. Calcium is important for bone health, and vitamin D helps your body to absorb calcium.  Talk with your health care provider about screening for osteoporosis if you are a woman who is age 48 or older, or a man who is age 48 or older. This information is not intended to replace advice given to you by your health care provider. Make sure you discuss any questions you have with your health care provider. Document Released: 10/20/2004 Document Revised: 12/23/2016 Document Reviewed: 11/04/2016 Elsevier Patient Education  2020 [employer].

## 2018-12-25 ENCOUNTER — Other Ambulatory Visit: Payer: Self-pay | Admitting: Family Medicine

## 2018-12-26 NOTE — Telephone Encounter (Signed)
Requested Prescriptions  Pending Prescriptions Disp Refills  . citalopram (CELEXA) 10 MG tablet [Pharmacy Med Name: Citalopram Hydrobromide 10 MG Oral Tablet] 270 tablet 0    Sig: Take 3 tablets by mouth once daily     Psychiatry:  Antidepressants - SSRI Failed - 12/25/2018  6:43 PM      Failed - Completed PHQ-2 or PHQ-9 in the last 360 days.      Passed - Valid encounter within last 6 months    Recent Outpatient Visits          2 weeks ago Age-related osteoporosis without current pathological fracture   [facility], Dionne Bucy, MD   1 month ago Encounter for annual physical exam   [facility], Dionne Bucy, MD   9 months ago Bronchitis   [facility], Dionne Bucy, MD   10 months ago Anxiety   [facility], Dionne Bucy, MD   1 year ago Anxiety   [facility], Dionne Bucy, MD

## 2018-12-27 ENCOUNTER — Ambulatory Visit: Admit: 2018-12-27 | Payer: BC Managed Care – PPO

## 2018-12-27 SURGERY — PHACOEMULSIFICATION, CATARACT, WITH IOL INSERTION
Anesthesia: Topical | Laterality: Right

## 2019-01-29 DIAGNOSIS — H2511 Age-related nuclear cataract, right eye: Secondary | ICD-10-CM | POA: Diagnosis not present

## 2019-02-05 ENCOUNTER — Encounter: Payer: Self-pay | Admitting: Ophthalmology

## 2019-02-05 ENCOUNTER — Other Ambulatory Visit: Payer: Self-pay

## 2019-02-11 ENCOUNTER — Other Ambulatory Visit
Admission: RE | Admit: 2019-02-11 | Discharge: 2019-02-11 | Disposition: A | Discharge status: Home / Self Care | Payer: BC Managed Care – PPO | Source: Ambulatory Visit | Attending: Ophthalmology | Admitting: Ophthalmology

## 2019-02-11 DIAGNOSIS — Z01812 Encounter for preprocedural laboratory examination: Secondary | ICD-10-CM | POA: Diagnosis not present

## 2019-02-11 DIAGNOSIS — Z20822 Contact with and (suspected) exposure to covid-19: Secondary | ICD-10-CM | POA: Insufficient documentation

## 2019-02-11 DIAGNOSIS — M810 Age-related osteoporosis without current pathological fracture: Secondary | ICD-10-CM | POA: Diagnosis not present

## 2019-02-11 LAB — SARS CORONAVIRUS 2 (TAT 6-24 HRS): SARS Coronavirus 2: NEGATIVE

## 2019-02-12 ENCOUNTER — Encounter: Payer: Self-pay | Admitting: Family Medicine

## 2019-02-12 ENCOUNTER — Telehealth: Payer: Self-pay

## 2019-02-12 LAB — VITAMIN D 25 HYDROXY (VIT D DEFICIENCY, FRACTURES): Vit D, 25-Hydroxy: 40.7 ng/mL (ref 30.0–100.0)

## 2019-02-12 NOTE — Telephone Encounter (Signed)
   Result Notes and Comments to Patient Comment seen by patient Amber Hayes on 02/12/2019 8:16 AM EST

## 2019-02-12 NOTE — Telephone Encounter (Signed)
-----   Message from Erasmo Downer, MD sent at 02/12/2019  8:15 AM EST ----- Normal labs

## 2019-02-12 NOTE — Discharge Instructions (Signed)

## 2019-02-13 ENCOUNTER — Ambulatory Visit: Payer: BC Managed Care – PPO | Admitting: Anesthesiology

## 2019-02-13 ENCOUNTER — Encounter: Payer: Self-pay | Admitting: Ophthalmology

## 2019-02-13 ENCOUNTER — Encounter
Admission: RE | Disposition: A | Discharge status: Home / Self Care | Payer: Self-pay | Source: Home / Self Care | Attending: Ophthalmology

## 2019-02-13 ENCOUNTER — Ambulatory Visit
Admission: RE | Admit: 2019-02-13 | Discharge: 2019-02-13 | Disposition: A | Discharge status: Home / Self Care | Payer: BC Managed Care – PPO | Attending: Ophthalmology | Admitting: Ophthalmology

## 2019-02-13 ENCOUNTER — Other Ambulatory Visit: Payer: Self-pay

## 2019-02-13 DIAGNOSIS — Z87891 Personal history of nicotine dependence: Secondary | ICD-10-CM | POA: Insufficient documentation

## 2019-02-13 DIAGNOSIS — Z79899 Other long term (current) drug therapy: Secondary | ICD-10-CM | POA: Insufficient documentation

## 2019-02-13 DIAGNOSIS — F329 Major depressive disorder, single episode, unspecified: Secondary | ICD-10-CM | POA: Diagnosis not present

## 2019-02-13 DIAGNOSIS — H25811 Combined forms of age-related cataract, right eye: Secondary | ICD-10-CM | POA: Diagnosis not present

## 2019-02-13 DIAGNOSIS — H2511 Age-related nuclear cataract, right eye: Secondary | ICD-10-CM | POA: Diagnosis not present

## 2019-02-13 DIAGNOSIS — F419 Anxiety disorder, unspecified: Secondary | ICD-10-CM | POA: Insufficient documentation

## 2019-02-13 HISTORY — PX: CATARACT EXTRACTION W/PHACO: SHX586

## 2019-02-13 HISTORY — DX: Age-related osteoporosis without current pathological fracture: M81.0

## 2019-02-13 SURGERY — PHACOEMULSIFICATION, CATARACT, WITH IOL INSERTION
Anesthesia: Monitor Anesthesia Care | Site: Eye | Laterality: Right

## 2019-02-13 MED ORDER — FENTANYL CITRATE (PF) 100 MCG/2ML IJ SOLN
INTRAMUSCULAR | Status: DC | PRN
  Administered 2019-02-13: 50 ug via INTRAVENOUS

## 2019-02-13 MED ORDER — LIDOCAINE HCL (PF) 2 % IJ SOLN
INTRAOCULAR | Status: DC | PRN
  Administered 2019-02-13: 2 mL

## 2019-02-13 MED ORDER — MOXIFLOXACIN HCL 0.5 % OP SOLN
1.0000 [drp] | OPHTHALMIC | Status: DC | PRN
  Administered 2019-02-13 (×3): 1 [drp] via OPHTHALMIC

## 2019-02-13 MED ORDER — TETRACAINE HCL 0.5 % OP SOLN
1.0000 [drp] | OPHTHALMIC | Status: DC | PRN
  Administered 2019-02-13 (×3): 1 [drp] via OPHTHALMIC

## 2019-02-13 MED ORDER — BRIMONIDINE TARTRATE-TIMOLOL 0.2-0.5 % OP SOLN
OPHTHALMIC | Status: DC | PRN
  Administered 2019-02-13: 1 [drp] via OPHTHALMIC

## 2019-02-13 MED ORDER — LACTATED RINGERS IV SOLN: 100.0000 mL/h | INTRAVENOUS | Status: DC

## 2019-02-13 MED ORDER — MIDAZOLAM HCL 2 MG/2ML IJ SOLN
INTRAMUSCULAR | Status: DC | PRN
  Administered 2019-02-13 (×2): 1 mg via INTRAVENOUS

## 2019-02-13 MED ORDER — NA HYALUR & NA CHOND-NA HYALUR 0.4-0.35 ML IO KIT
PACK | INTRAOCULAR | Status: DC | PRN
  Administered 2019-02-13: 1 mL via INTRAOCULAR

## 2019-02-13 MED ORDER — CEFUROXIME OPHTHALMIC INJECTION 1 MG/0.1 ML
INJECTION | OPHTHALMIC | Status: DC | PRN
  Administered 2019-02-13: 0.1 mL via INTRACAMERAL

## 2019-02-13 MED ORDER — LACTATED RINGERS IV SOLN: INTRAVENOUS | Status: DC

## 2019-02-13 MED ORDER — ARMC OPHTHALMIC DILATING DROPS
1.0000 "application " | OPHTHALMIC | Status: DC | PRN
  Administered 2019-02-13 (×3): 1 via OPHTHALMIC

## 2019-02-13 MED ORDER — EPINEPHRINE PF 1 MG/ML IJ SOLN
INTRAOCULAR | Status: DC | PRN
  Administered 2019-02-13: 44 mL via OPHTHALMIC

## 2019-02-13 SURGICAL SUPPLY — 16 items
CANNULA ANT/CHMB 27G (MISCELLANEOUS) ×1 IMPLANT
CANNULA ANT/CHMB 27GA (MISCELLANEOUS) ×2 IMPLANT
GLOVE SURG LX 7.5 STRW (GLOVE) ×1
GLOVE SURG LX STRL 7.5 STRW (GLOVE) ×1 IMPLANT
GLOVE SURG TRIUMPH 8.0 PF LTX (GLOVE) ×2 IMPLANT
GOWN STRL REUS W/ TWL LRG LVL3 (GOWN DISPOSABLE) ×2 IMPLANT
GOWN STRL REUS W/TWL LRG LVL3 (GOWN DISPOSABLE) ×2
LENS IOL TECNIS ITEC 19.5 (Intraocular Lens) ×1 IMPLANT
MARKER SKIN DUAL TIP RULER LAB (MISCELLANEOUS) ×2 IMPLANT
PACK CATARACT BRASINGTON (MISCELLANEOUS) ×2 IMPLANT
PACK EYE AFTER SURG (MISCELLANEOUS) ×2 IMPLANT
PACK OPTHALMIC (MISCELLANEOUS) ×2 IMPLANT
SYR 3ML LL SCALE MARK (SYRINGE) ×2 IMPLANT
SYR TB 1ML LUER SLIP (SYRINGE) ×2 IMPLANT
WATER STERILE IRR 500ML POUR (IV SOLUTION) ×2 IMPLANT
WIPE NON LINTING 3.25X3.25 (MISCELLANEOUS) ×2 IMPLANT

## 2019-02-13 NOTE — Op Note (Signed)
LOCATION:  [facility]   PREOPERATIVE DIAGNOSIS:    Nuclear sclerotic cataract right eye. H25.11   POSTOPERATIVE DIAGNOSIS:  Nuclear sclerotic cataract right eye.     PROCEDURE:  Phacoemusification with posterior chamber intraocular lens placement of the right eye   ULTRASOUND TIME: Procedure(s): CATARACT EXTRACTION PHACO AND INTRAOCULAR LENS PLACEMENT (IOC) RIGHT 3.81 00:38.5 9.9% (Right)  LENS:   Implant Name Type Inv. Item Serial No. Manufacturer Lot No. LRB No. Used Action  LENS IOL DIOP 19.5 - [id number] Intraocular Lens LENS IOL DIOP 19.5 [id number]  Right 1 Implanted         SURGEON:  Deirdre Evener, MD   ANESTHESIA:  Topical with tetracaine drops and 2% Xylocaine jelly, augmented with 1% preservative-free intracameral lidocaine.    COMPLICATIONS:  None.   DESCRIPTION OF PROCEDURE:  The patient was identified in the holding room and transported to the operating room and placed in the supine position under the operating microscope.  The right eye was identified as the operative eye and it was prepped and draped in the usual sterile ophthalmic fashion.   A 1 millimeter clear-corneal paracentesis was made at the 3:00 position.  0.5 ml of preservative-free 1% lidocaine was injected into the anterior chamber. The anterior chamber was filled with Viscoat viscoelastic.  A 2.4 millimeter keratome was used to make a near-clear corneal incision at the 12:00 position.  A curvilinear capsulorrhexis was made with a cystotome and capsulorrhexis forceps.  Balanced salt solution was used to hydrodissect and hydrodelineate the nucleus.   Phacoemulsification was then used in stop and chop fashion to remove the lens nucleus and epinucleus.  The remaining cortex was then removed using the irrigation and aspiration handpiece. Provisc was then placed into the capsular bag to distend it for lens placement.  A lens was then injected into the capsular bag.  The remaining  viscoelastic was aspirated.   Wounds were hydrated with balanced salt solution.  The anterior chamber was inflated to a physiologic pressure with balanced salt solution.  No wound leaks were noted. Cefuroxime 0.1 ml of a 10mg /ml solution was injected into the anterior chamber for a dose of 1 mg of intracameral antibiotic at the completion of the case.   Timolol and Brimonidine drops were applied to the eye.  The patient was taken to the recovery room in stable condition without complications of anesthesia or surgery.   Liesel Peckenpaugh 02/13/2019, 11:10 AM

## 2019-02-13 NOTE — Anesthesia Procedure Notes (Signed)
Procedure Name: MAC Date/Time: 02/13/2019 10:46 AM Performed by: Cameron Ali, CRNA Pre-anesthesia Checklist: Patient identified, Emergency Drugs available, Suction available, Timeout performed and Patient being monitored Patient Re-evaluated:Patient Re-evaluated prior to induction Oxygen Delivery Method: Nasal cannula Placement Confirmation: positive ETCO2

## 2019-02-13 NOTE — Anesthesia Postprocedure Evaluation (Signed)
Anesthesia Post Note  Patient: Amber Hayes  Procedure(s) Performed: CATARACT EXTRACTION PHACO AND INTRAOCULAR LENS PLACEMENT (IOC) RIGHT 3.81 00:38.5 9.9% (Right Eye)     Patient location during evaluation: PACU Anesthesia Type: MAC Level of consciousness: awake and alert Pain management: pain level controlled Vital Signs Assessment: post-procedure vital signs reviewed and stable Respiratory status: spontaneous breathing, nonlabored ventilation, respiratory function stable and patient connected to nasal cannula oxygen Cardiovascular status: stable and blood pressure returned to baseline Postop Assessment: no apparent nausea or vomiting Anesthetic complications: no    Dayyan Krist

## 2019-02-13 NOTE — H&P (Signed)

## 2019-02-13 NOTE — Transfer of Care (Signed)
Immediate Anesthesia Transfer of Care Note  Patient: Amber Hayes  Procedure(s) Performed: CATARACT EXTRACTION PHACO AND INTRAOCULAR LENS PLACEMENT (IOC) RIGHT 3.81 00:38.5 9.9% (Right Eye)  Patient Location: PACU  Anesthesia Type: MAC  Level of Consciousness: awake, alert  and patient cooperative  Airway and Oxygen Therapy: Patient Spontanous Breathing and Patient connected to supplemental oxygen  Post-op Assessment: Post-op Vital signs reviewed, Patient's Cardiovascular Status Stable, Respiratory Function Stable, Patent Airway and No signs of Nausea or vomiting  Post-op Vital Signs: Reviewed and stable  Complications: No apparent anesthesia complications

## 2019-02-13 NOTE — Anesthesia Preprocedure Evaluation (Signed)
Anesthesia Evaluation  Patient identified by MRN, date of birth, ID band Patient awake    Reviewed: NPO status   History of Anesthesia Complications Negative for: history of anesthetic complications  Airway Mallampati: II  TM Distance: >3 FB Neck ROM: full    Dental no notable dental hx.    Pulmonary neg pulmonary ROS, former smoker,    Pulmonary exam normal        Cardiovascular Exercise Tolerance: Good negative cardio ROS Normal cardiovascular exam     Neuro/Psych Anxiety negative neurological ROS     GI/Hepatic negative GI ROS, Neg liver ROS,   Endo/Other  negative endocrine ROS  Renal/GU negative Renal ROS  negative genitourinary   Musculoskeletal   Abdominal   Peds  Hematology negative hematology ROS (+)   Anesthesia Other Findings Covid: NEG.  Reproductive/Obstetrics                             Anesthesia Physical Anesthesia Plan  ASA: II  Anesthesia Plan: MAC   Post-op Pain Management:    Induction:   PONV Risk Score and Plan: 2 and Midazolam and Ondansetron  Airway Management Planned:   Additional Equipment:   Intra-op Plan:   Post-operative Plan:   Informed Consent: I have reviewed the patients History and Physical, chart, labs and discussed the procedure including the risks, benefits and alternatives for the proposed anesthesia with the patient or authorized representative who has indicated his/her understanding and acceptance.       Plan Discussed with: CRNA  Anesthesia Plan Comments:         Anesthesia Quick Evaluation

## 2019-02-14 ENCOUNTER — Encounter: Payer: Self-pay | Admitting: *Deleted

## 2019-03-15 DIAGNOSIS — F330 Major depressive disorder, recurrent, mild: Secondary | ICD-10-CM | POA: Diagnosis not present

## 2019-03-22 DIAGNOSIS — F330 Major depressive disorder, recurrent, mild: Secondary | ICD-10-CM | POA: Diagnosis not present

## 2019-03-28 DIAGNOSIS — M810 Age-related osteoporosis without current pathological fracture: Secondary | ICD-10-CM | POA: Diagnosis not present

## 2019-03-28 DIAGNOSIS — E559 Vitamin D deficiency, unspecified: Secondary | ICD-10-CM | POA: Diagnosis not present

## 2019-03-29 ENCOUNTER — Other Ambulatory Visit: Payer: Self-pay

## 2019-03-29 ENCOUNTER — Ambulatory Visit: Payer: BC Managed Care – PPO | Attending: Internal Medicine

## 2019-03-29 DIAGNOSIS — Z23 Encounter for immunization: Secondary | ICD-10-CM | POA: Insufficient documentation

## 2019-03-29 NOTE — Progress Notes (Signed)
   Covid-19 Vaccination Clinic  Name:  Amber Hayes    MRN: [id number] DOB: 09-18-1955  03/29/2019  Amber Hayes was observed post Covid-19 immunization for 15 minutes without incident. She was provided with Vaccine Information Sheet and instruction to access the V-Safe system.   Amber Hayes was instructed to call 911 with any severe reactions post vaccine: Marland Kitchen Difficulty breathing  . Swelling of face and throat  . A fast heartbeat  . A bad rash all over body  . Dizziness and weakness

## 2019-04-03 DIAGNOSIS — H2512 Age-related nuclear cataract, left eye: Secondary | ICD-10-CM | POA: Diagnosis not present

## 2019-04-04 ENCOUNTER — Other Ambulatory Visit: Payer: Self-pay

## 2019-04-04 ENCOUNTER — Encounter: Payer: Self-pay | Admitting: Ophthalmology

## 2019-04-04 DIAGNOSIS — M810 Age-related osteoporosis without current pathological fracture: Secondary | ICD-10-CM | POA: Diagnosis not present

## 2019-04-05 DIAGNOSIS — F330 Major depressive disorder, recurrent, mild: Secondary | ICD-10-CM | POA: Diagnosis not present

## 2019-04-08 ENCOUNTER — Other Ambulatory Visit
Admission: RE | Admit: 2019-04-08 | Discharge: 2019-04-08 | Disposition: A | Discharge status: Home / Self Care | Payer: BC Managed Care – PPO | Source: Ambulatory Visit | Attending: Ophthalmology | Admitting: Ophthalmology

## 2019-04-08 DIAGNOSIS — Z20822 Contact with and (suspected) exposure to covid-19: Secondary | ICD-10-CM | POA: Insufficient documentation

## 2019-04-08 DIAGNOSIS — Z01812 Encounter for preprocedural laboratory examination: Secondary | ICD-10-CM | POA: Diagnosis not present

## 2019-04-08 DIAGNOSIS — H33312 Horseshoe tear of retina without detachment, left eye: Secondary | ICD-10-CM | POA: Diagnosis not present

## 2019-04-08 LAB — SARS CORONAVIRUS 2 (TAT 6-24 HRS): SARS Coronavirus 2: NEGATIVE

## 2019-04-08 NOTE — Discharge Instructions (Signed)

## 2019-04-10 ENCOUNTER — Ambulatory Visit
Admission: RE | Admit: 2019-04-10 | Discharge: 2019-04-10 | Disposition: A | Discharge status: Home / Self Care | Payer: BC Managed Care – PPO | Attending: Ophthalmology | Admitting: Ophthalmology

## 2019-04-10 ENCOUNTER — Encounter: Payer: Self-pay | Admitting: Ophthalmology

## 2019-04-10 ENCOUNTER — Other Ambulatory Visit: Payer: Self-pay

## 2019-04-10 ENCOUNTER — Encounter
Admission: RE | Disposition: A | Discharge status: Home / Self Care | Payer: Self-pay | Source: Home / Self Care | Attending: Ophthalmology

## 2019-04-10 ENCOUNTER — Ambulatory Visit: Payer: BC Managed Care – PPO | Admitting: Anesthesiology

## 2019-04-10 DIAGNOSIS — M810 Age-related osteoporosis without current pathological fracture: Secondary | ICD-10-CM | POA: Insufficient documentation

## 2019-04-10 DIAGNOSIS — H2512 Age-related nuclear cataract, left eye: Secondary | ICD-10-CM | POA: Insufficient documentation

## 2019-04-10 DIAGNOSIS — R519 Headache, unspecified: Secondary | ICD-10-CM | POA: Diagnosis not present

## 2019-04-10 DIAGNOSIS — F329 Major depressive disorder, single episode, unspecified: Secondary | ICD-10-CM | POA: Insufficient documentation

## 2019-04-10 DIAGNOSIS — Z79899 Other long term (current) drug therapy: Secondary | ICD-10-CM | POA: Insufficient documentation

## 2019-04-10 DIAGNOSIS — Z87891 Personal history of nicotine dependence: Secondary | ICD-10-CM | POA: Insufficient documentation

## 2019-04-10 DIAGNOSIS — Z9841 Cataract extraction status, right eye: Secondary | ICD-10-CM | POA: Insufficient documentation

## 2019-04-10 DIAGNOSIS — F419 Anxiety disorder, unspecified: Secondary | ICD-10-CM | POA: Insufficient documentation

## 2019-04-10 DIAGNOSIS — H25812 Combined forms of age-related cataract, left eye: Secondary | ICD-10-CM | POA: Diagnosis not present

## 2019-04-10 HISTORY — PX: CATARACT EXTRACTION W/PHACO: SHX586

## 2019-04-10 SURGERY — PHACOEMULSIFICATION, CATARACT, WITH IOL INSERTION
Anesthesia: Monitor Anesthesia Care | Site: Eye | Laterality: Left

## 2019-04-10 MED ORDER — TETRACAINE HCL 0.5 % OP SOLN
1.0000 [drp] | OPHTHALMIC | Status: DC | PRN
  Administered 2019-04-10 (×3): 1 [drp] via OPHTHALMIC

## 2019-04-10 MED ORDER — NA HYALUR & NA CHOND-NA HYALUR 0.4-0.35 ML IO KIT
PACK | INTRAOCULAR | Status: DC | PRN
  Administered 2019-04-10: 1 mL via INTRAOCULAR

## 2019-04-10 MED ORDER — OXYCODONE HCL 5 MG/5ML PO SOLN: 5.0000 mg | Freq: Once | ORAL | Status: DC | PRN

## 2019-04-10 MED ORDER — BRIMONIDINE TARTRATE-TIMOLOL 0.2-0.5 % OP SOLN
OPHTHALMIC | Status: DC | PRN
  Administered 2019-04-10: 1 [drp] via OPHTHALMIC

## 2019-04-10 MED ORDER — MIDAZOLAM HCL 2 MG/2ML IJ SOLN
INTRAMUSCULAR | Status: DC | PRN
  Administered 2019-04-10: 2 mg via INTRAVENOUS

## 2019-04-10 MED ORDER — LACTATED RINGERS IV SOLN: INTRAVENOUS | Status: DC

## 2019-04-10 MED ORDER — MOXIFLOXACIN HCL 0.5 % OP SOLN
1.0000 [drp] | OPHTHALMIC | Status: DC | PRN
  Administered 2019-04-10 (×3): 1 [drp] via OPHTHALMIC

## 2019-04-10 MED ORDER — LIDOCAINE HCL (PF) 2 % IJ SOLN
INTRAOCULAR | Status: DC | PRN
  Administered 2019-04-10: 2 mL

## 2019-04-10 MED ORDER — OXYCODONE HCL 5 MG PO TABS: 5.0000 mg | ORAL_TABLET | Freq: Once | ORAL | Status: DC | PRN

## 2019-04-10 MED ORDER — ARMC OPHTHALMIC DILATING DROPS
1.0000 "application " | OPHTHALMIC | Status: DC | PRN
  Administered 2019-04-10 (×3): 1 via OPHTHALMIC

## 2019-04-10 MED ORDER — EPINEPHRINE PF 1 MG/ML IJ SOLN
INTRAOCULAR | Status: DC | PRN
  Administered 2019-04-10: 46 mL via OPHTHALMIC

## 2019-04-10 MED ORDER — CEFUROXIME OPHTHALMIC INJECTION 1 MG/0.1 ML
INJECTION | OPHTHALMIC | Status: DC | PRN
  Administered 2019-04-10: 0.1 mL via INTRACAMERAL

## 2019-04-10 MED ORDER — FENTANYL CITRATE (PF) 100 MCG/2ML IJ SOLN
INTRAMUSCULAR | Status: DC | PRN
  Administered 2019-04-10 (×2): 50 ug via INTRAVENOUS

## 2019-04-10 SURGICAL SUPPLY — 23 items
CANNULA ANT/CHMB 27G (MISCELLANEOUS) ×1 IMPLANT
CANNULA ANT/CHMB 27GA (MISCELLANEOUS) ×2 IMPLANT
GLOVE SURG LX 7.5 STRW (GLOVE) ×1
GLOVE SURG LX STRL 7.5 STRW (GLOVE) ×1 IMPLANT
GLOVE SURG TRIUMPH 8.0 PF LTX (GLOVE) ×2 IMPLANT
GOWN STRL REUS W/ TWL LRG LVL3 (GOWN DISPOSABLE) ×2 IMPLANT
GOWN STRL REUS W/TWL LRG LVL3 (GOWN DISPOSABLE) ×2
LENS IOL DIOP 20.0 (Intraocular Lens) ×2 IMPLANT
LENS IOL TECNIS MONO 20.0 (Intraocular Lens) IMPLANT
MARKER SKIN DUAL TIP RULER LAB (MISCELLANEOUS) ×2 IMPLANT
NDL CAPSULORHEX 25GA (NEEDLE) ×1 IMPLANT
NDL FILTER BLUNT 18X1 1/2 (NEEDLE) ×2 IMPLANT
NEEDLE CAPSULORHEX 25GA (NEEDLE) ×2 IMPLANT
NEEDLE FILTER BLUNT 18X 1/2SAF (NEEDLE) ×2
NEEDLE FILTER BLUNT 18X1 1/2 (NEEDLE) ×2 IMPLANT
PACK CATARACT BRASINGTON (MISCELLANEOUS) ×2 IMPLANT
PACK EYE AFTER SURG (MISCELLANEOUS) ×2 IMPLANT
PACK OPTHALMIC (MISCELLANEOUS) ×2 IMPLANT
SOLUTION OPHTHALMIC SALT (MISCELLANEOUS) ×2 IMPLANT
SYR 3ML LL SCALE MARK (SYRINGE) ×4 IMPLANT
SYR TB 1ML LUER SLIP (SYRINGE) ×2 IMPLANT
WATER STERILE IRR 250ML POUR (IV SOLUTION) ×2 IMPLANT
WIPE NON LINTING 3.25X3.25 (MISCELLANEOUS) ×2 IMPLANT

## 2019-04-10 NOTE — Anesthesia Preprocedure Evaluation (Signed)
Anesthesia Evaluation  Patient identified by MRN, date of birth, ID band Patient awake    Reviewed: NPO status   History of Anesthesia Complications Negative for: history of anesthetic complications  Airway Mallampati: II  TM Distance: >3 FB Neck ROM: full    Dental no notable dental hx.    Pulmonary neg pulmonary ROS, former smoker,    Pulmonary exam normal        Cardiovascular Exercise Tolerance: Good negative cardio ROS Normal cardiovascular exam     Neuro/Psych Anxiety negative neurological ROS     GI/Hepatic negative GI ROS, Neg liver ROS,   Endo/Other  negative endocrine ROS  Renal/GU negative Renal ROS  negative genitourinary   Musculoskeletal   Abdominal   Peds  Hematology negative hematology ROS (+)   Anesthesia Other Findings Covid: NEG.  Reproductive/Obstetrics                             Anesthesia Physical  Anesthesia Plan  ASA: II  Anesthesia Plan: MAC   Post-op Pain Management:    Induction:   PONV Risk Score and Plan: 2 and Midazolam and TIVA  Airway Management Planned:   Additional Equipment:   Intra-op Plan:   Post-operative Plan:   Informed Consent: I have reviewed the patients History and Physical, chart, labs and discussed the procedure including the risks, benefits and alternatives for the proposed anesthesia with the patient or authorized representative who has indicated his/her understanding and acceptance.       Plan Discussed with: CRNA  Anesthesia Plan Comments:         Anesthesia Quick Evaluation

## 2019-04-10 NOTE — Op Note (Signed)
OPERATIVE NOTE  Amber Hayes [id number] 04/10/2019   PREOPERATIVE DIAGNOSIS:  Nuclear sclerotic cataract left eye. H25.12   POSTOPERATIVE DIAGNOSIS:    Nuclear sclerotic cataract left eye.     PROCEDURE:  Phacoemusification with posterior chamber intraocular lens placement of the left eye  Ultrasound time: Procedure(s): CATARACT EXTRACTION PHACO AND INTRAOCULAR LENS PLACEMENT (IOC) LEFT 2.02 00:27.3 7.4% (Left)  LENS:   Implant Name Type Inv. Item Serial No. Manufacturer Lot No. LRB No. Used Action  LENS IOL DIOP 20.0 - [id number] Intraocular Lens LENS IOL DIOP 20.0 [id number]  Left 1 Implanted      SURGEON:  Deirdre Evener, MD   ANESTHESIA:  Topical with tetracaine drops and 2% Xylocaine jelly, augmented with 1% preservative-free intracameral lidocaine.    COMPLICATIONS:  None.   DESCRIPTION OF PROCEDURE:  The patient was identified in the holding room and transported to the operating room and placed in the supine position under the operating microscope.  The left eye was identified as the operative eye and it was prepped and draped in the usual sterile ophthalmic fashion.   A 1 millimeter clear-corneal paracentesis was made at the 3:00 position.  0.5 ml of preservative-free 1% lidocaine was injected into the anterior chamber.  The anterior chamber was filled with Viscoat viscoelastic.  A 2.4 millimeter keratome was used to make a near-clear corneal incision at the 12:00 position.  .  A curvilinear capsulorrhexis was made with a cystotome and capsulorrhexis forceps.  Balanced salt solution was used to hydrodissect and hydrodelineate the nucleus.   Phacoemulsification was then used in stop and chop fashion to remove the lens nucleus and epinucleus.  The remaining cortex was then removed using the irrigation and aspiration handpiece. Provisc was then placed into the capsular bag to distend it for lens placement.  A lens was then injected into the capsular bag.  The  remaining viscoelastic was aspirated.   Wounds were hydrated with balanced salt solution.  The anterior chamber was inflated to a physiologic pressure with balanced salt solution.  No wound leaks were noted. Cefuroxime 0.1 ml of a 10mg /ml solution was injected into the anterior chamber for a dose of 1 mg of intracameral antibiotic at the completion of the case.   Timolol and Brimonidine drops were applied to the eye.  The patient was taken to the recovery room in stable condition without complications of anesthesia or surgery.  [name] 04/10/2019, 12:53 PM

## 2019-04-10 NOTE — H&P (Signed)

## 2019-04-10 NOTE — Transfer of Care (Signed)
Immediate Anesthesia Transfer of Care Note  Patient: Amber Hayes  Procedure(s) Performed: CATARACT EXTRACTION PHACO AND INTRAOCULAR LENS PLACEMENT (IOC) LEFT 2.02 00:27.3 7.4% (Left Eye)  Patient Location: PACU  Anesthesia Type: MAC  Level of Consciousness: awake, alert  and patient cooperative  Airway and Oxygen Therapy: Patient Spontanous Breathing and Patient connected to supplemental oxygen  Post-op Assessment: Post-op Vital signs reviewed, Patient's Cardiovascular Status Stable, Respiratory Function Stable, Patent Airway and No signs of Nausea or vomiting  Post-op Vital Signs: Reviewed and stable  Complications: No apparent anesthesia complications

## 2019-04-10 NOTE — Anesthesia Postprocedure Evaluation (Signed)
Anesthesia Post Note  Patient: Amber Hayes  Procedure(s) Performed: CATARACT EXTRACTION PHACO AND INTRAOCULAR LENS PLACEMENT (IOC) LEFT 2.02 00:27.3 7.4% (Left Eye)     Patient location during evaluation: PACU Anesthesia Type: MAC Level of consciousness: awake and alert Pain management: pain level controlled Vital Signs Assessment: post-procedure vital signs reviewed and stable Respiratory status: spontaneous breathing, nonlabored ventilation, respiratory function stable and patient connected to nasal cannula oxygen Cardiovascular status: stable and blood pressure returned to baseline Postop Assessment: no apparent nausea or vomiting Anesthetic complications: no    Juaquin Ludington

## 2019-04-10 NOTE — Anesthesia Procedure Notes (Addendum)
Procedure Name: MAC Date/Time: 04/10/2019 12:33 PM Performed by: Jeannene Patella, CRNA Pre-anesthesia Checklist: Patient identified, Emergency Drugs available, Suction available, Patient being monitored and Timeout performed Patient Re-evaluated:Patient Re-evaluated prior to induction Oxygen Delivery Method: Nasal cannula

## 2019-04-11 ENCOUNTER — Encounter: Payer: Self-pay | Admitting: *Deleted

## 2019-04-12 DIAGNOSIS — F330 Major depressive disorder, recurrent, mild: Secondary | ICD-10-CM | POA: Diagnosis not present

## 2019-04-18 ENCOUNTER — Ambulatory Visit: Payer: BC Managed Care – PPO | Attending: Internal Medicine

## 2019-04-18 DIAGNOSIS — Z23 Encounter for immunization: Secondary | ICD-10-CM

## 2019-04-18 NOTE — Progress Notes (Signed)
   Covid-19 Vaccination Clinic  Name:  Amber Hayes    MRN: [id number] DOB: Sep 06, 1955  04/18/2019  [name] was observed post Covid-19 immunization for 15 minutes without incident. She was provided with Vaccine Information Sheet and instruction to access the V-Safe system.   [name] was instructed to call 911 with any severe reactions post vaccine: Marland Kitchen Difficulty breathing  . Swelling of face and throat  . A fast heartbeat  . A bad rash all over body  . Dizziness and weakness   Immunizations Administered    Name Date Dose VIS Date Route   Pfizer COVID-19 Vaccine 04/18/2019 10:38 AM 0.3 mL 01/04/2019 Intramuscular   Manufacturer: [employer], [employer]   Lot: [id number]   NDC: [address]-8

## 2019-04-19 ENCOUNTER — Encounter: Payer: Self-pay | Admitting: Family Medicine

## 2019-04-26 ENCOUNTER — Ambulatory Visit: Payer: BC Managed Care – PPO | Admitting: Family Medicine

## 2019-04-29 ENCOUNTER — Ambulatory Visit: Payer: BC Managed Care – PPO | Admitting: Family Medicine

## 2019-04-29 ENCOUNTER — Other Ambulatory Visit: Payer: Self-pay

## 2019-04-29 ENCOUNTER — Encounter: Payer: Self-pay | Admitting: Family Medicine

## 2019-04-29 ENCOUNTER — Other Ambulatory Visit (HOSPITAL_COMMUNITY)
Admission: RE | Admit: 2019-04-29 | Discharge: 2019-04-29 | Disposition: A | Discharge status: Home / Self Care | Payer: BC Managed Care – PPO | Source: Ambulatory Visit | Attending: Family Medicine | Admitting: Family Medicine

## 2019-04-29 VITALS — BP 145/78 | HR 65 | Temp 97.1°F | Wt 130.0 lb

## 2019-04-29 DIAGNOSIS — Z124 Encounter for screening for malignant neoplasm of cervix: Secondary | ICD-10-CM | POA: Insufficient documentation

## 2019-04-29 DIAGNOSIS — R1084 Generalized abdominal pain: Secondary | ICD-10-CM

## 2019-04-29 NOTE — Progress Notes (Signed)
Established patient visit   Patient: Amber Hayes   DOB: April 07, 1955   64 y.o. Female  MRN: [id number] Visit Date: 04/29/2019  Today's healthcare provider: Lavon Paganini, MD  Subjective:    Chief Complaint  Patient presents with  . Abdominal Pain   Abdominal Pain This is a chronic problem. The pain is located in the generalized abdominal region. The quality of the pain is dull (pressure). Associated symptoms include constipation. Pertinent negatives include no anorexia, arthralgias, diarrhea, fever, flatus, frequency, hematuria, melena, myalgias, nausea, vomiting or weight loss.   Has hesitated to have it looked at as it was ambiguous.  Feels like a band around her waist.  Never comfortable.  Full feeling that it is pushing up on her lungs.  Ongoing for >1 yr.  Has cut out core strengthening that she used to do regularly.    Using miracle tea (herbal for colon cleansing) for 1.5 yrs.  Even with that, still not having regular BMs.  Has a lot of fiber in her diet.  Often has BM daily, but can go 2-3 days between them.  When more active, BMs are more regularly.  No early satiety, dysphagia  Patient Active Problem List   Diagnosis Date Noted  . Age-related osteoporosis without current pathological fracture 12/10/2018  . Panic attack 06/23/2017  . Cystocele without uterine prolapse 12/29/2016  . Urinary frequency 12/28/2016  . Vaginal atrophy 11/18/2016  . Allergic rhinitis 05/14/2015  . Blood pressure elevated without history of HTN 05/14/2015  . Decreased libido 05/14/2015  . Abnormal bruising 05/14/2015  . Pure hypercholesterolemia 05/14/2015  . Anxiety 02/16/2015  . Atypical chest pain 07/10/2009  . Alcohol use 12/05/2007   Past Medical History:  Diagnosis Date  . Osteoporosis    Past Surgical History:  Procedure Laterality Date  . AUGMENTATION MAMMAPLASTY Bilateral 2003  . CATARACT EXTRACTION W/PHACO Right 02/13/2019   Procedure: CATARACT EXTRACTION PHACO AND  INTRAOCULAR LENS PLACEMENT (IOC) RIGHT 3.81 00:38.5 9.9%;  Surgeon: Leandrew Koyanagi, MD;  Location: [facility];  Service: Ophthalmology;  Laterality: Right;  . CATARACT EXTRACTION W/PHACO Left 04/10/2019   Procedure: CATARACT EXTRACTION PHACO AND INTRAOCULAR LENS PLACEMENT (IOC) LEFT 2.02 00:27.3 7.4%;  Surgeon: Leandrew Koyanagi, MD;  Location: [place];  Service: Ophthalmology;  Laterality: Left;  . COLONOSCOPY WITH PROPOFOL N/A 10/19/2018   Procedure: COLONOSCOPY WITH PROPOFOL;  Surgeon: Jonathon Bellows, MD;  Location: [facility] ENDOSCOPY;  Service: Gastroenterology;  Laterality: N/A;  . EYE SURGERY  1999   Lasik  . EYE SURGERY  2017   Torn Retina   Social History   Tobacco Use  . Smoking status: Former Smoker    Packs/day: 0.50    Years: 15.00    Pack years: 7.50    Quit date: 01/24/2004    Years since quitting: 15.2  . Smokeless tobacco: Never Used  Substance Use Topics  . Alcohol use: No  . Drug use: No       Medications: Outpatient Medications Prior to Visit  Medication Sig  . Calcium Carb-Cholecalciferol (CALCIUM 1000 + D PO) Take by mouth.  . citalopram (CELEXA) 10 MG tablet Take 3 tablets by mouth once daily  . VITAMIN D PO Take by mouth.  . Probiotic Product (PROBIOTIC DAILY PO) Take by mouth.   No facility-administered medications prior to visit.    Review of Systems  Constitutional: Negative for fever and weight loss.  Respiratory: Negative for shortness of breath.   Gastrointestinal: Positive for abdominal distention, abdominal pain  and constipation. Negative for anal bleeding, anorexia, blood in stool, diarrhea, flatus, melena, nausea, rectal pain and vomiting.  Genitourinary: Negative for frequency and hematuria.  Musculoskeletal: Positive for back pain. Negative for arthralgias, gait problem, joint swelling, myalgias, neck pain and neck stiffness.  Neurological: Negative for weakness.    Last CBC Lab Results  Component Value Date    WBC 4.4 11/13/2018   HGB 12.6 11/13/2018   HCT 39.8 11/13/2018   MCV 80 11/13/2018   MCH 25.3 (L) 11/13/2018   RDW 18.0 (H) 11/13/2018   PLT 205 11/13/2018   Last metabolic panel Lab Results  Component Value Date   GLUCOSE 84 11/13/2018   NA 141 11/13/2018   K 3.9 11/13/2018   CL 104 11/13/2018   CO2 24 11/13/2018   BUN 14 11/13/2018   CREATININE 0.84 11/13/2018   GFRNONAA 73 11/13/2018   GFRAA 84 11/13/2018   CALCIUM 9.0 11/13/2018   PROT 5.9 (L) 11/13/2018   ALBUMIN 4.0 11/13/2018   LABGLOB 1.9 11/13/2018   AGRATIO 2.1 11/13/2018   BILITOT 0.4 11/13/2018   ALKPHOS 108 11/13/2018   AST 21 11/13/2018   ALT 14 11/13/2018        Objective:    BP (!) 145/78 (BP Location: Left Arm, Patient Position: Sitting, Cuff Size: Normal)   Pulse 65   Temp (!) 97.1 F (36.2 C) (Temporal)   Wt 130 lb (59 kg)   BMI 19.77 kg/m  BP Readings from Last 3 Encounters:  04/29/19 (!) 145/78  04/10/19 131/82  02/13/19 137/90   Wt Readings from Last 3 Encounters:  04/29/19 130 lb (59 kg)  04/10/19 132 lb (59.9 kg)  02/13/19 130 lb (59 kg)      Physical Exam Vitals reviewed.  Constitutional:      General: She is not in acute distress.    Appearance: She is well-developed. She is not diaphoretic.  HENT:     Head: Normocephalic and atraumatic.  Cardiovascular:     Rate and Rhythm: Normal rate and regular rhythm.     Heart sounds: Normal heart sounds. No murmur.  Pulmonary:     Effort: Pulmonary effort is normal. No respiratory distress.     Breath sounds: Normal breath sounds. No wheezing.  Abdominal:     General: Bowel sounds are normal.     Palpations: Abdomen is soft. There is no shifting dullness or fluid wave.     Tenderness: There is no abdominal tenderness. There is no right CVA tenderness, left CVA tenderness, guarding or rebound. Negative signs include Murphy's sign and McBurney's sign.  Genitourinary:    Vagina: Normal.     Cervix: Normal.     Uterus: Normal.       Adnexa: Right adnexa normal and left adnexa normal.  Skin:    General: Skin is warm and dry.     Capillary Refill: Capillary refill takes less than 2 seconds.     Findings: No rash.  Neurological:     Mental Status: She is alert and oriented to person, place, and time.  Psychiatric:        Mood and Affect: Mood normal.        Behavior: Behavior normal.       No results found for any visits on 04/29/19.    Assessment & Plan:    1. Generalized abdominal pain - unclear etiology -Benign abdominal exam today -Given sensation in mid abdomen with radiation to her back, check lipase -Reviewed recent CBC and CMP  without abnormality -Get abdominal ultrasound to further evaluate -Reassured her that colonoscopy was normal during the time that she has been having the pain, which is reassuring that she does not have any colon cancer or masses that are causing this -Also discussed the importance of staying active and eating high-fiber diet to help with intermittent constipation -Return precautions discussed - US Abdomen Complete; Future - Lipase  2. Screening for cervical cancer -Pap smear collected today for screening - Cytology - PAP   Return in about 6 months (around 10/29/2019) for CPE, as scheduled.   The entirety of the information documented in the History of Present Illness, Review of Systems and Physical Exam were personally obtained by me. Portions of this information were initially documented by Kavin Leech, CMA and reviewed by me for thoroughness and accuracy.    Theo Reither, Marzella Schlein, MD MPH [facility]

## 2019-04-29 NOTE — Patient Instructions (Signed)
Abdominal Pain, Adult Pain in the abdomen (abdominal pain) can be caused by many things. Often, abdominal pain is not serious and it gets better with no treatment or by being treated at home. However, sometimes abdominal pain is serious. Your health care provider will ask questions about your medical history and do a physical exam to try to determine the cause of your abdominal pain. Follow these instructions at home:  Medicines  Take over-the-counter and prescription medicines only as told by your health care provider.  Do not take a laxative unless told by your health care provider. General instructions  Watch your condition for any changes.  Drink enough fluid to keep your urine pale yellow.  Keep all follow-up visits as told by your health care provider. This is important. Contact a health care provider if:  Your abdominal pain changes or gets worse.  You are not hungry or you lose weight without trying.  You are constipated or have diarrhea for more than 2-3 days.  You have pain when you urinate or have a bowel movement.  Your abdominal pain wakes you up at night.  Your pain gets worse with meals, after eating, or with certain foods.  You are vomiting and cannot keep anything down.  You have a fever.  You have blood in your urine. Get help right away if:  Your pain does not go away as soon as your health care provider told you to expect.  You cannot stop vomiting.  Your pain is only in areas of the abdomen, such as the right side or the left lower portion of the abdomen. Pain on the right side could be caused by appendicitis.  You have bloody or black stools, or stools that look like tar.  You have severe pain, cramping, or bloating in your abdomen.  You have signs of dehydration, such as: ? Dark urine, very little urine, or no urine. ? Cracked lips. ? Dry mouth. ? Sunken eyes. ? Sleepiness. ? Weakness.  You have trouble breathing or chest  pain. Summary  Often, abdominal pain is not serious and it gets better with no treatment or by being treated at home. However, sometimes abdominal pain is serious.  Watch your condition for any changes.  Take over-the-counter and prescription medicines only as told by your health care provider.  Contact a health care provider if your abdominal pain changes or gets worse.  Get help right away if you have severe pain, cramping, or bloating in your abdomen. This information is not intended to replace advice given to you by your health care provider. Make sure you discuss any questions you have with your health care provider. Document Revised: 05/21/2018 Document Reviewed: 05/21/2018 Elsevier Patient Education  2020 Elsevier Inc.  

## 2019-05-02 LAB — CYTOLOGY - PAP
Comment: NEGATIVE
Diagnosis: NEGATIVE
High risk HPV: NEGATIVE

## 2019-05-03 ENCOUNTER — Ambulatory Visit
Admission: RE | Admit: 2019-05-03 | Discharge: 2019-05-03 | Disposition: A | Discharge status: Home / Self Care | Payer: BC Managed Care – PPO | Source: Ambulatory Visit | Attending: Family Medicine | Admitting: Family Medicine

## 2019-05-03 ENCOUNTER — Other Ambulatory Visit: Payer: Self-pay

## 2019-05-03 DIAGNOSIS — R1084 Generalized abdominal pain: Secondary | ICD-10-CM | POA: Insufficient documentation

## 2019-05-03 DIAGNOSIS — N281 Cyst of kidney, acquired: Secondary | ICD-10-CM | POA: Diagnosis not present

## 2019-05-03 DIAGNOSIS — F330 Major depressive disorder, recurrent, mild: Secondary | ICD-10-CM | POA: Diagnosis not present

## 2019-05-04 LAB — LIPASE: Lipase: 61 U/L (ref 14–72)

## 2019-05-06 ENCOUNTER — Telehealth: Payer: Self-pay

## 2019-05-06 NOTE — Telephone Encounter (Signed)
Comment seen by patient Amber Hayes on 05/06/2019 3:12 PM EDT.

## 2019-05-06 NOTE — Progress Notes (Signed)
Yep. Is not the cause

## 2019-05-06 NOTE — Telephone Encounter (Signed)
-----   Message from Erasmo Downer, MD sent at 05/06/2019  2:24 PM EDT ----- Normal labs and pap smear

## 2019-05-10 ENCOUNTER — Other Ambulatory Visit: Payer: Self-pay | Admitting: Family Medicine

## 2019-05-10 ENCOUNTER — Telehealth: Payer: Self-pay

## 2019-05-10 DIAGNOSIS — F330 Major depressive disorder, recurrent, mild: Secondary | ICD-10-CM | POA: Diagnosis not present

## 2019-05-10 NOTE — Telephone Encounter (Signed)
pt given recommendations per Dr Beryle Flock; she verbalized understanding.

## 2019-05-10 NOTE — Telephone Encounter (Signed)
Core strengthening and working on fiber intake to ensure 1 soft BM daily

## 2019-05-10 NOTE — Telephone Encounter (Signed)
LMTCB 05/10/2019. PEC please advise pt as below.   Thanks,   -Vernona Rieger

## 2019-05-10 NOTE — Telephone Encounter (Signed)
Called and informed patient of her [place] results of her abdomen not being the cause of her pain. She gave verbal understanding but wanted to know if there is any additional remedy and she will start the core building exercises in the mean time to see if that helps.

## 2019-05-13 DIAGNOSIS — H1131 Conjunctival hemorrhage, right eye: Secondary | ICD-10-CM | POA: Diagnosis not present

## 2019-05-17 DIAGNOSIS — F330 Major depressive disorder, recurrent, mild: Secondary | ICD-10-CM | POA: Diagnosis not present

## 2019-05-24 DIAGNOSIS — F330 Major depressive disorder, recurrent, mild: Secondary | ICD-10-CM | POA: Diagnosis not present

## 2019-05-31 DIAGNOSIS — F330 Major depressive disorder, recurrent, mild: Secondary | ICD-10-CM | POA: Diagnosis not present

## 2019-06-15 ENCOUNTER — Other Ambulatory Visit: Payer: Self-pay | Admitting: Family Medicine

## 2019-06-15 NOTE — Telephone Encounter (Signed)
Requested Prescriptions  Pending Prescriptions Disp Refills  . citalopram (CELEXA) 10 MG tablet [Pharmacy Med Name: Citalopram Hydrobromide 10 MG Oral Tablet] 90 tablet 1    Sig: Take 3 tablets by mouth once daily     Psychiatry:  Antidepressants - SSRI Passed - 06/15/2019 10:13 AM      Passed - Valid encounter within last 6 months    Recent Outpatient Visits          1 month ago Generalized abdominal pain   [facility] [place], Marzella Schlein, MD   6 months ago Age-related osteoporosis without current pathological fracture   [facility] [place], Marzella Schlein, MD   7 months ago Encounter for annual physical exam   [facility], Marzella Schlein, MD   1 year ago Bronchitis   [facility], Marzella Schlein, MD   1 year ago Anxiety   [facility] [place], Marzella Schlein, MD

## 2019-06-19 ENCOUNTER — Encounter: Payer: Self-pay | Admitting: Family Medicine

## 2019-06-21 DIAGNOSIS — H43813 Vitreous degeneration, bilateral: Secondary | ICD-10-CM | POA: Diagnosis not present

## 2019-07-19 ENCOUNTER — Encounter: Payer: Self-pay | Admitting: Family Medicine

## 2019-08-05 ENCOUNTER — Encounter: Payer: Self-pay | Admitting: Family Medicine

## 2019-08-07 ENCOUNTER — Telehealth: Payer: Self-pay | Admitting: Family Medicine

## 2019-08-07 NOTE — Telephone Encounter (Signed)
Appt scheduled to discuss starting anxiety meds.

## 2019-08-07 NOTE — Telephone Encounter (Signed)
Pt called stating that she is having issues with her anxiety again and that she is interested in being put back on her anxiety medication. Please advise.

## 2019-08-09 ENCOUNTER — Encounter: Payer: Self-pay | Admitting: Physician Assistant

## 2019-08-09 ENCOUNTER — Other Ambulatory Visit: Payer: Self-pay

## 2019-08-09 ENCOUNTER — Ambulatory Visit: Payer: BC Managed Care – PPO | Admitting: Physician Assistant

## 2019-08-09 VITALS — BP 132/78 | HR 62 | Temp 96.8°F | Wt 131.4 lb

## 2019-08-09 DIAGNOSIS — F419 Anxiety disorder, unspecified: Secondary | ICD-10-CM

## 2019-08-09 MED ORDER — BUSPIRONE HCL 7.5 MG PO TABS: 7.5000 mg | ORAL_TABLET | Freq: Two times a day (BID) | ORAL | 0 refills | Status: DC

## 2019-08-09 NOTE — Progress Notes (Signed)
Established patient visit   Patient: Amber Hayes   DOB: [birth year]   64 y.o. Female  MRN: [id number] Visit Date: 08/09/2019  Today's healthcare provider: Trey Sailors, PA-C   Chief Complaint  Patient presents with  . Anxiety  I,Adriana M Pollak,acting as a scribe for Trey Sailors, PA-C.,have documented all relevant documentation on the behalf of Trey Sailors, PA-C,as directed by  Trey Sailors, PA-C while in the presence of Trey Sailors, PA-C.  Subjective    HPI  Anxiety, Follow-up  She was last seen for anxiety 9 months ago. Changes made at last visit include patient decided to change her Celexa dose from 30 to 10 mg every 3 to 5 days on 06/19/2019. Patient states that she had started back taking Celexa 20 mg 2 nights ago. Has previously been on lexapro and zoloft but does not remember why she stopped taking this. Has also tried Buspar but again does not remember why this was stopped. Does not remember any side effects or intolerances to it.    She reports fair compliance with treatment. She reports good tolerance of treatment. She is not having side effects.   She feels her anxiety is moderate and Unchanged since last visit.  Symptoms: No chest pain Yes difficulty concentrating  Yes dizziness No fatigue  Yes feelings of losing control No insomnia  Yes irritable No palpitations  No panic attacks Yes racing thoughts  No shortness of breath No sweating  Yes tremors/shakes    GAD-7 Results GAD-7 Generalized Anxiety Disorder Screening Tool 08/09/2019 02/08/2018 11/06/2017  1. Feeling Nervous, Anxious, or on Edge 3 0 2  2. Not Being Able to Stop or Control Worrying 2 0 1  3. Worrying Too Much About Different Things 2 0 1  4. Trouble Relaxing 3 0 1  5. Being So Restless it's Hard To Sit Still 3 0 1  6. Becoming Easily Annoyed or Irritable 3 0 1  7. Feeling Afraid As If Something Awful Might Happen 2 0 1  Total GAD-7 Score 18 0 8  Difficulty At Work,  Home, or Getting  Along With Others? Somewhat difficult Not difficult at all Somewhat difficult    PHQ-9 Scores PHQ9 SCORE ONLY 11/08/2018 02/08/2018 11/06/2017  PHQ-9 Total Score 1 1 4     --------------------------------------------------------------------------------------------------- Patient reports possibly seen a neurologist for her anxiety. Patient has been on Buspar and Lexapro in the pass. She was advised to gradually increase her Celexa back to 30 mg in the next 1-2 weeks.      Medications: Outpatient Medications Prior to Visit  Medication Sig  . Calcium Carb-Cholecalciferol (CALCIUM 1000 + D PO) Take by mouth.  . citalopram (CELEXA) 10 MG tablet Take 3 tablets by mouth once daily  . Probiotic Product (PROBIOTIC DAILY PO) Take by mouth. (Patient not taking: Reported on 08/09/2019)  . VITAMIN D PO Take by mouth. (Patient not taking: Reported on 08/09/2019)   No facility-administered medications prior to visit.    Review of Systems  Constitutional: Negative.   Respiratory: Negative.   Cardiovascular: Negative.   Neurological: Negative.   Psychiatric/Behavioral: Positive for agitation and decreased concentration. The patient is nervous/anxious.       Objective    BP 132/78 (BP Location: Left Arm, Patient Position: Sitting, Cuff Size: Normal)   Pulse 62   Temp (!) 96.8 F (36 C) (Temporal)   Wt 131 lb 6.4 oz (59.6 kg)   SpO2 96%   BMI 19.98  kg/m    Physical Exam Constitutional:      Appearance: Normal appearance.  Cardiovascular:     Rate and Rhythm: Normal rate.  Pulmonary:     Effort: Pulmonary effort is normal.  Skin:    General: Skin is warm and dry.  Neurological:     Mental Status: She is alert and oriented to person, place, and time. Mental status is at baseline.  Psychiatric:        Mood and Affect: Mood normal.        Behavior: Behavior normal.       No results found for any visits on 08/09/19.  Assessment & Plan    1. Anxiety Patient was  prescribed a maintenance medication Buspar for her anxiety as below, in addition to Celexa. Continue counseling. F/u 6 weeks.  - busPIRone (BUSPAR) 7.5 MG tablet; Take 1 tablet (7.5 mg total) by mouth 2 (two) times daily.  Dispense: 180 tablet; Refill: 0   Return in about 6 weeks (around 09/20/2019) for anxiety .      ITrey Sailors, PA-C, have reviewed all documentation for this visit. The documentation on 08/13/19 for the exam, diagnosis, procedures, and orders are all accurate and complete.    Maryella Shivers  [facility] [phone] (phone) [phone] (fax)  [facility]

## 2019-08-09 NOTE — Patient Instructions (Signed)

## 2019-10-02 DIAGNOSIS — M810 Age-related osteoporosis without current pathological fracture: Secondary | ICD-10-CM | POA: Diagnosis not present

## 2019-10-02 DIAGNOSIS — E559 Vitamin D deficiency, unspecified: Secondary | ICD-10-CM | POA: Diagnosis not present

## 2019-10-09 DIAGNOSIS — M810 Age-related osteoporosis without current pathological fracture: Secondary | ICD-10-CM | POA: Diagnosis not present

## 2019-10-18 ENCOUNTER — Other Ambulatory Visit: Payer: Self-pay | Admitting: Family Medicine

## 2019-10-18 NOTE — Telephone Encounter (Signed)
Requested Prescriptions  Pending Prescriptions Disp Refills  . citalopram (CELEXA) 10 MG tablet [Pharmacy Med Name: Citalopram Hydrobromide 10 MG Oral Tablet] 90 tablet 0    Sig: Take 3 tablets by mouth once daily     Psychiatry:  Antidepressants - SSRI Passed - 10/18/2019  7:36 AM      Passed - Valid encounter within last 6 months    Recent Outpatient Visits          2 months ago Anxiety   [facility] [place], Ricki Rodriguez M, PA-C   5 months ago Generalized abdominal pain   [facility] [place], Marzella Schlein, MD   10 months ago Age-related osteoporosis without current pathological fracture   [facility] [place], Marzella Schlein, MD   11 months ago Encounter for annual physical exam   [facility] [place], Marzella Schlein, MD   1 year ago Bronchitis   [facility] [place], Marzella Schlein, MD      Future Appointments            In 1 week Judi Saa, DO [facility]

## 2019-10-24 NOTE — Progress Notes (Signed)
[facility] [address] [place] [address] Phone: [phone] Subjective:   Bruce Donath, am serving as a scribe for Dr. Antoine Primas. This visit occurred during the SARS-CoV-2 public health emergency.  Safety protocols were in place, including screening questions prior to the visit, additional usage of staff PPE, and extensive cleaning of exam room while observing appropriate contact time as indicated for disinfecting solutions.   I'm seeing this patient by the request  of:  Bacigalupo, Marzella Schlein, MD  CC: Neck pain, abdominal pain  [id number]  Amber Hayes is a 64 y.o. female coming in with complaint of neck pain and tightness in lower abdomen. Neck pain is causing headaches throughout her week. Pain is more on right than left. Denies any radiating symptoms. Uses Aleve prn. Patient does work out regularly for years. She has noticed that she has had more stiffness recently.  Has been sitting more at a desk recently.   History of over active bladder and lack of consistency of bowel movements. Patient states that she also notes that all of her toes are numb since she has returned to work at [employer]. Numbness continues into the evening. Patient notes that she sits with her feet plantar flexed.  Patient states that when she moves it seems to go away.  Reviewed patient's chart.  Will do bone density did show that patient is osteoporotic from November 2020    Past Medical History:  Diagnosis Date  . Osteoporosis    Past Surgical History:  Procedure Laterality Date  . AUGMENTATION MAMMAPLASTY Bilateral 2003  . CATARACT EXTRACTION W/PHACO Right 02/13/2019   Procedure: CATARACT EXTRACTION PHACO AND INTRAOCULAR LENS PLACEMENT (IOC) RIGHT 3.81 00:38.5 9.9%;  Surgeon: Lockie Mola, MD;  Location: [facility];  Service: Ophthalmology;  Laterality: Right;  . CATARACT EXTRACTION W/PHACO Left 04/10/2019   Procedure: CATARACT EXTRACTION PHACO  AND INTRAOCULAR LENS PLACEMENT (IOC) LEFT 2.02 00:27.3 7.4%;  Surgeon: Lockie Mola, MD;  Location: [facility];  Service: Ophthalmology;  Laterality: Left;  . COLONOSCOPY WITH PROPOFOL N/A 10/19/2018   Procedure: COLONOSCOPY WITH PROPOFOL;  Surgeon: Wyline Mood, MD;  Location: [facility];  Service: Gastroenterology;  Laterality: N/A;  . EYE SURGERY  1999   Lasik  . EYE SURGERY  2017   Torn Retina   Social History   Socioeconomic History  . Marital status: Married    Spouse name: Thayer Ohm  . Number of children: 2  . Years of education: undergrad  . Highest education level: Not on file  Occupational History  . 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 Smoking status: Former Smoker    Packs/day: 0.50    Years: 15.00    Pack years: 7.50    Quit date: 01/24/2004    Years since quitting: 15.7  . Smokeless tobacco: Never Used  Vaping Use  . Vaping Use: Never used  Substance and Sexual Activity  . Alcohol use: No  . Drug use: No  . Sexual activity: Yes    Birth control/protection: Post-menopausal  Other Topics Concern  . Not on file  Social History Narrative  . Not on file   Social Determinants of Health   Financial Resource Strain:   . Difficulty of Paying Living Expenses: Not on file  Food Insecurity:   . Worried About Programme researcher, broadcasting/film/video in the Last Year: Not on file  .  Ran Out of Food in the Last Year: Not on file  Transportation Needs:   . Lack of Transportation (Medical): Not on file  . Lack of Transportation (Non-Medical): Not on file  Physical Activity:   . Days of Exercise per Week: Not on file  . Minutes of Exercise per Session: Not on file  Stress:   . Feeling of Stress : Not on file  Social Connections:   . Frequency of Communication with Friends and Family: Not on file  . Frequency of Social Gatherings with Friends and Family: Not on file  .  Attends Religious Services: Not on file  . Active Member of Clubs or Organizations: Not on file  . Attends Banker Meetings: Not on file  . Marital Status: Not on file   No Known Allergies Family History  Problem Relation Age of Onset  . Alzheimer's disease Mother   . Heart attack Father   . Hypertension Sister   . Bronchitis Sister   . Neurologic Disorder Sister        global transient amnesia x 2  . Breast cancer Other          Current Outpatient Medications (Other):  .  busPIRone (BUSPAR) 7.5 MG tablet, Take 1 tablet (7.5 mg total) by mouth 2 (two) times daily. .  Calcium Carb-Cholecalciferol (CALCIUM 1000 + D PO), Take by mouth. .  citalopram (CELEXA) 10 MG tablet, Take 3 tablets by mouth once daily .  Probiotic Product (PROBIOTIC DAILY PO), Take by mouth.  Marland Kitchen  VITAMIN D PO, Take by mouth.  .  Vitamin D, Ergocalciferol, (DRISDOL) 1.25 MG (50000 UNIT) CAPS capsule, Take 1 capsule (50,000 Units total) by mouth every 7 (seven) days.   Reviewed prior external information including notes and imaging from  primary care provider As well as notes that were available from care everywhere and other healthcare systems.  Past medical history, social, surgical and family history all reviewed in electronic medical record.  No pertanent information unless stated regarding to the chief complaint.   Review of Systems:  No headache, visual changes, nausea, vomiting, diarrhea, constipation, dizziness, abdominal pain, skin rash, fevers, chills, night sweats, weight loss, swollen lymph nodes, body aches, joint swelling, chest pain, shortness of breath, mood changes. POSITIVE muscle aches  Objective  Blood pressure 124/86, pulse 72, height 5\' 8"  (1.727 m), weight 129 lb (58.5 kg), SpO2 99 %.   General: No apparent distress alert and oriented x3 mood and affect normal, dressed appropriately.  HEENT: Pupils equal, extraocular movements intact  Respiratory: Patient's speak in full  sentences and does not appear short of breath  Cardiovascular: No lower extremity edema, non tender, no erythema  Neuro: Cranial nerves II through XII are intact, neurovascularly intact in all extremities with 2+ DTRs and 2+ pulses.  Gait normal with good balance and coordination.  MSK: Patient's neck exam does have some loss of lordosis, lacks last 10 degrees of flexion in the last 5 degrees of extension.  Mild crepitus noted.  Mild scapular dyskinesis noted left greater than right.  No spinous process tenderness.  Abdominal exam shows that patient does have a possible stool large burden noted.    Impression and Recommendations:     The above documentation has been reviewed and is accurate and complete , DO

## 2019-10-25 ENCOUNTER — Ambulatory Visit (INDEPENDENT_AMBULATORY_CARE_PROVIDER_SITE_OTHER)
Admission: RE | Admit: 2019-10-25 | Discharge: 2019-10-25 | Disposition: A | Discharge status: Home / Self Care | Payer: BC Managed Care – PPO | Source: Ambulatory Visit | Attending: Family Medicine | Admitting: Family Medicine

## 2019-10-25 ENCOUNTER — Other Ambulatory Visit: Payer: Self-pay

## 2019-10-25 ENCOUNTER — Ambulatory Visit (INDEPENDENT_AMBULATORY_CARE_PROVIDER_SITE_OTHER): Payer: BC Managed Care – PPO | Admitting: Family Medicine

## 2019-10-25 ENCOUNTER — Encounter: Payer: Self-pay | Admitting: Family Medicine

## 2019-10-25 VITALS — BP 124/86 | HR 72 | Ht 68.0 in | Wt 129.0 lb

## 2019-10-25 DIAGNOSIS — K5909 Other constipation: Secondary | ICD-10-CM | POA: Diagnosis not present

## 2019-10-25 DIAGNOSIS — M542 Cervicalgia: Secondary | ICD-10-CM | POA: Insufficient documentation

## 2019-10-25 MED ORDER — VITAMIN D (ERGOCALCIFEROL) 1.25 MG (50000 UNIT) PO CAPS: 50000.0000 [IU] | ORAL_CAPSULE | ORAL | 0 refills | Status: DC

## 2019-10-25 NOTE — Assessment & Plan Note (Signed)
Patient does have neck pain that I think is secondary to more posture and ergonomics.  I do think there is some underlying degenerative disc disease and we will get an x-ray to further evaluate.  Patient is not having any significant radicular symptoms.  Due to patient's history of osteoporosis started on a once weekly vitamin D that will help with muscle strength and endurance, discussed different working environment and given a note for an adjustable standing desk.  We discussed home exercises and patient work with athletic trainer to learn them in greater detail.  Follow-up with me again 6 weeks.

## 2019-10-25 NOTE — Assessment & Plan Note (Signed)
Chronic constipation.  Discussed different things over-the-counter that I think can help with some of the abdominal pain.  Hopefully this will be beneficial.  Follow-up again in 4 to 6 weeks

## 2019-10-25 NOTE — Patient Instructions (Addendum)
Good to see you Scapular exercises Neck xray at [facility] location  Once weekly vitamin D Iron 65 mg with 500 mg vitamin C Probiotic with 10 strains and at least 10 billion units 1 weeks of mirlax 17 grams daily with 100 mg of colace daily Adjustable standing desk See me again in 6 weeks

## 2019-11-05 ENCOUNTER — Other Ambulatory Visit: Payer: Self-pay | Admitting: Family Medicine

## 2019-11-05 DIAGNOSIS — Z1231 Encounter for screening mammogram for malignant neoplasm of breast: Secondary | ICD-10-CM

## 2019-11-06 ENCOUNTER — Ambulatory Visit
Admission: RE | Admit: 2019-11-06 | Discharge: 2019-11-06 | Disposition: A | Discharge status: Home / Self Care | Payer: BC Managed Care – PPO | Source: Ambulatory Visit | Attending: Family Medicine | Admitting: Family Medicine

## 2019-11-06 ENCOUNTER — Other Ambulatory Visit: Payer: Self-pay | Admitting: Family Medicine

## 2019-11-06 ENCOUNTER — Other Ambulatory Visit: Payer: Self-pay

## 2019-11-06 DIAGNOSIS — Z1231 Encounter for screening mammogram for malignant neoplasm of breast: Secondary | ICD-10-CM | POA: Diagnosis not present

## 2019-11-08 ENCOUNTER — Encounter: Payer: Self-pay | Admitting: Family Medicine

## 2019-11-17 ENCOUNTER — Other Ambulatory Visit: Payer: Self-pay | Admitting: Physician Assistant

## 2019-11-17 DIAGNOSIS — F419 Anxiety disorder, unspecified: Secondary | ICD-10-CM

## 2019-11-17 NOTE — Telephone Encounter (Signed)
Requested Prescriptions  Pending Prescriptions Disp Refills  . busPIRone (BUSPAR) 7.5 MG tablet [Pharmacy Med Name: busPIRone HCl 7.5 MG Oral Tablet] 60 tablet 0    Sig: Take 1 tablet by mouth twice daily     Psychiatry: Anxiolytics/Hypnotics - Non-controlled Passed - 11/17/2019  5:57 PM      Passed - Valid encounter within last 6 months    Recent Outpatient Visits          3 months ago Anxiety   [facility] [place], Ricki Rodriguez M, PA-C   6 months ago Generalized abdominal pain   [facility] [place], Marzella Schlein, MD   11 months ago Age-related osteoporosis without current pathological fracture   [facility] [place], Marzella Schlein, MD   1 year ago Encounter for annual physical exam   [facility] [place], Marzella Schlein, MD   1 year ago Bronchitis   [facility] [place], Marzella Schlein, MD      Future Appointments            In 2 weeks Judi Saa, DO [facility]

## 2019-11-19 DIAGNOSIS — H43813 Vitreous degeneration, bilateral: Secondary | ICD-10-CM | POA: Diagnosis not present

## 2019-12-01 ENCOUNTER — Other Ambulatory Visit: Payer: Self-pay | Admitting: Family Medicine

## 2019-12-01 NOTE — Telephone Encounter (Signed)
Requested Prescriptions  Pending Prescriptions Disp Refills   citalopram (CELEXA) 10 MG tablet [Pharmacy Med Name: Citalopram Hydrobromide 10 MG Oral Tablet] 90 tablet 0    Sig: Take 3 tablets by mouth once daily     Psychiatry:  Antidepressants - SSRI Passed - 12/01/2019  6:03 PM      Passed - Valid encounter within last 6 months    Recent Outpatient Visits          3 months ago Anxiety   [facility] [place], Ricki Rodriguez M, PA-C   7 months ago Generalized abdominal pain   [facility] [place], Marzella Schlein, MD   11 months ago Age-related osteoporosis without current pathological fracture   [facility] [place], Marzella Schlein, MD   1 year ago Encounter for annual physical exam   [facility] [place], Marzella Schlein, MD   1 year ago Bronchitis   [facility] [place], Marzella Schlein, MD      Future Appointments            In 4 days Cheryll Dessert [facility]

## 2019-12-04 NOTE — Progress Notes (Signed)
[facility] [address] [place] [address] Phone: [phone] Subjective:   I Ronelle Nigh am serving as a Neurosurgeon for Dr. Antoine Primas.  This visit occurred during the SARS-CoV-2 public health emergency.  Safety protocols were in place, including screening questions prior to the visit, additional usage of staff PPE, and extensive cleaning of exam room while observing appropriate contact time as indicated for disinfecting solutions.   I'm seeing this patient by the request  of:  Erasmo Downer, MD  CC: Neck pain and abdominal pain follow-up  [id number]   10/1/20201 Patient does have neck pain that I think is secondary to more posture and ergonomics.  I do think there is some underlying degenerative disc disease and we will get an x-ray to further evaluate.  Patient is not having any significant radicular symptoms.  Due to patient's history of osteoporosis started on a once weekly vitamin D that will help with muscle strength and endurance, discussed different working environment and given a note for an adjustable standing desk.  We discussed home exercises and patient work with athletic trainer to learn them in greater detail.  Follow-up with me again 6 weeks.  Update 12/05/2019 Maanya Hippert is a 64 y.o. female coming in with complaint of cervical spine pain. States her neck has gotten better. States she grinds her teeth at night and believes some of her neck pain is coming from it. States she gets headaches at night. Patient states she is having an issue with her abdomen. States it feels like she has a steel band that raps around to her back. Abdomen contracts and feels like a "Charlie horse" in her stomach. States she wonders if it has anything to do with pelvic floor/bowel issues. States she is active and it does not make sense.  States that the neck has improved overall it is more the abdominal pain that she is having trouble  with.  Reviewing patient's chart patient has had this for quite some time.  Was seen by uro-Guynn previously out of the [facility] hospital where patient's work-up included an ultrasound that was transvaginal and unremarkable.  Patient recently in April 2021 and also had an ultrasound of the abdominal complete that showed a right renal cyst but otherwise fairly unremarkable. Colonoscopy was done in 2020 and unremarkable and is not due for another 10 years.    Past Medical History:  Diagnosis Date  . Osteoporosis    Past Surgical History:  Procedure Laterality Date  . AUGMENTATION MAMMAPLASTY Bilateral 2003  . CATARACT EXTRACTION W/PHACO Right 02/13/2019   Procedure: CATARACT EXTRACTION PHACO AND INTRAOCULAR LENS PLACEMENT (IOC) RIGHT 3.81 00:38.5 9.9%;  Surgeon: Lockie Mola, MD;  Location: [facility];  Service: Ophthalmology;  Laterality: Right;  . CATARACT EXTRACTION W/PHACO Left 04/10/2019   Procedure: CATARACT EXTRACTION PHACO AND INTRAOCULAR LENS PLACEMENT (IOC) LEFT 2.02 00:27.3 7.4%;  Surgeon: Lockie Mola, MD;  Location: [facility];  Service: Ophthalmology;  Laterality: Left;  . COLONOSCOPY WITH PROPOFOL N/A 10/19/2018   Procedure: COLONOSCOPY WITH PROPOFOL;  Surgeon: Wyline Mood, MD;  Location: [facility];  Service: Gastroenterology;  Laterality: N/A;  . EYE SURGERY  1999   Lasik  . EYE SURGERY  2017   Torn Retina   Social History   Socioeconomic History  . Marital status: Married    Spouse name: Thayer Ohm  . Number of children: 2  . Years of education: undergrad  . Highest education level: Not on file  Occupational  History  . 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 Smoking status: Former Smoker    Packs/day: 0.50    Years: 15.00    Pack years: 7.50    Quit date: 01/24/2004    Years since quitting: 15.8  . Smokeless tobacco: Never Used  Vaping Use   . Vaping Use: Never used  Substance and Sexual Activity  . Alcohol use: No  . Drug use: No  . Sexual activity: Yes    Birth control/protection: Post-menopausal  Other Topics Concern  . Not on file  Social History Narrative  . Not on file   Social Determinants of Health   Financial Resource Strain:   . Difficulty of Paying Living Expenses: Not on file  Food Insecurity:   . Worried About Programme researcher, broadcasting/film/video in the Last Year: Not on file  . Ran Out of Food in the Last Year: Not on file  Transportation Needs:   . Lack of Transportation (Medical): Not on file  . Lack of Transportation (Non-Medical): Not on file  Physical Activity:   . Days of Exercise per Week: Not on file  . Minutes of Exercise per Session: Not on file  Stress:   . Feeling of Stress : Not on file  Social Connections:   . Frequency of Communication with Friends and Family: Not on file  . Frequency of Social Gatherings with Friends and Family: Not on file  . Attends Religious Services: Not on file  . Active Member of Clubs or Organizations: Not on file  . Attends Banker Meetings: Not on file  . Marital Status: Not on file   No Known Allergies Family History  Problem Relation Age of Onset  . Alzheimer's disease Mother   . Heart attack Father   . Hypertension Sister   . Bronchitis Sister   . Neurologic Disorder Sister        global transient amnesia x 2  . Breast cancer Other          Current Outpatient Medications (Other):  .  busPIRone (BUSPAR) 7.5 MG tablet, Take 1 tablet by mouth twice daily .  Calcium Carb-Cholecalciferol (CALCIUM 1000 + D PO), Take by mouth. .  citalopram (CELEXA) 10 MG tablet, Take 3 tablets by mouth once daily .  Probiotic Product (PROBIOTIC DAILY PO), Take by mouth.  Marland Kitchen  VITAMIN D PO, Take by mouth.  .  Vitamin D, Ergocalciferol, (DRISDOL) 1.25 MG (50000 UNIT) CAPS capsule, Take 1 capsule (50,000 Units total) by mouth every 7 (seven) days.   Reviewed prior  external information including notes and imaging from  primary care provider As well as notes that were available from care everywhere and other healthcare systems.  Past medical history, social, surgical and family history all reviewed in electronic medical record.  No pertanent information unless stated regarding to the chief complaint.   Review of Systems:  No headache, visual changes, nausea, vomiting, diarrhea, constipation, dizziness,, skin rash, fevers, chills, night sweats, weight loss, swollen lymph nodes, , joint swelling, chest pain, shortness of breath, mood changes. POSITIVE muscle aches, body aches, abdominal pain  Objective  Blood pressure 140/90, pulse 61, height 5\' 8"  (1.727 m), weight 126 lb (57.2 kg), SpO2 98 %.   General: No apparent distress alert and oriented x3 mood and affect normal, dressed appropriately.  HEENT: Pupils equal, extraocular movements intact  Respiratory: Patient's speak in  full sentences and does not appear short of breath  Cardiovascular: No lower extremity edema, non tender, no erythema   Gait normal with good balance and coordination.  MSK: Mild arthritic changes of multiple joints Neck exam shows some improvement in range of motion but still has some crepitus.  Abdominal exam no shows the patient is abdomen is mildly distended.  Still feel that patient has some mild stool burden especially the left lower quadrant.  No masses appreciated.  Mild increase in discomfort in the epigastric region.   Impression and Recommendations:     The above documentation has been reviewed and is accurate and complete Judi Saa, DO

## 2019-12-05 ENCOUNTER — Other Ambulatory Visit: Payer: Self-pay

## 2019-12-05 ENCOUNTER — Encounter: Payer: Self-pay | Admitting: Family Medicine

## 2019-12-05 ENCOUNTER — Ambulatory Visit (INDEPENDENT_AMBULATORY_CARE_PROVIDER_SITE_OTHER): Payer: BC Managed Care – PPO | Admitting: Family Medicine

## 2019-12-05 VITALS — BP 140/90 | HR 61 | Ht 68.0 in | Wt 126.0 lb

## 2019-12-05 DIAGNOSIS — N8110 Cystocele, unspecified: Secondary | ICD-10-CM

## 2019-12-05 DIAGNOSIS — R1030 Lower abdominal pain, unspecified: Secondary | ICD-10-CM | POA: Diagnosis not present

## 2019-12-05 DIAGNOSIS — M810 Age-related osteoporosis without current pathological fracture: Secondary | ICD-10-CM

## 2019-12-05 DIAGNOSIS — M542 Cervicalgia: Secondary | ICD-10-CM

## 2019-12-05 DIAGNOSIS — Z789 Other specified health status: Secondary | ICD-10-CM

## 2019-12-05 DIAGNOSIS — R109 Unspecified abdominal pain: Secondary | ICD-10-CM | POA: Insufficient documentation

## 2019-12-05 DIAGNOSIS — R1084 Generalized abdominal pain: Secondary | ICD-10-CM

## 2019-12-05 DIAGNOSIS — Z7289 Other problems related to lifestyle: Secondary | ICD-10-CM

## 2019-12-05 NOTE — Patient Instructions (Addendum)
Good to see you CT order has been sent  Glad the neck is doing better Coop pillow Stay active otherwise I will write you after CT

## 2019-12-05 NOTE — Assessment & Plan Note (Signed)
Patient has had abdominal pain she states for quite some time.  We can see the patient has seen different providers for it for over the course of time.  Patient has seen providers at different health systems.  Patient has been diagnosed with a cystocele previously most recent ultrasound of the abdomen was fairly unremarkable.  Patient is able to stay fairly active and has done pelvic floor exercises with no significant benefit.  Patient states that the pain now seems to be waking her up at night.  States that his symptoms tightness of the straight through to her back.  At this point I think patient has been complaining for it for multiple years and a CT abdomen and pelvis with new onset of nighttime pain is warranted.  We will rule out any type of hernia aspect that could be contributing.  None was felt today.  Depending on the CT scan we will discuss further evaluation.

## 2019-12-05 NOTE — Assessment & Plan Note (Signed)
Continue the vitamin supplementations and the vitamin D

## 2019-12-05 NOTE — Assessment & Plan Note (Signed)
Patient is making improvement with the ergonomics and no change in management

## 2019-12-05 NOTE — Assessment & Plan Note (Signed)
If CT scan normal will consider referral for pelvic floor therapy

## 2019-12-05 NOTE — Assessment & Plan Note (Signed)
History of this most recent lipase 61

## 2019-12-22 ENCOUNTER — Other Ambulatory Visit: Payer: Self-pay | Admitting: Family Medicine

## 2019-12-22 DIAGNOSIS — F419 Anxiety disorder, unspecified: Secondary | ICD-10-CM

## 2019-12-22 NOTE — Telephone Encounter (Signed)
Called pt to make appt, LM on VM - previously given 30 day courtesy RF- Refusing refill and routing to office

## 2019-12-23 ENCOUNTER — Encounter: Payer: Self-pay | Admitting: Physician Assistant

## 2019-12-23 ENCOUNTER — Encounter: Payer: Self-pay | Admitting: Family Medicine

## 2019-12-23 DIAGNOSIS — F419 Anxiety disorder, unspecified: Secondary | ICD-10-CM

## 2019-12-23 NOTE — Telephone Encounter (Signed)
Ok to send in refill. She was seen by Ricki Rodriguez in July.  She is overdue for CPE/AWV though, so maybe we can get her to schedule those

## 2019-12-24 MED ORDER — BUSPIRONE HCL 7.5 MG PO TABS: 7.5000 mg | ORAL_TABLET | Freq: Two times a day (BID) | ORAL | 0 refills | Status: DC

## 2019-12-25 ENCOUNTER — Other Ambulatory Visit: Payer: Self-pay

## 2019-12-25 ENCOUNTER — Ambulatory Visit: Payer: BC Managed Care – PPO | Admitting: Physician Assistant

## 2019-12-25 ENCOUNTER — Encounter: Payer: Self-pay | Admitting: Physician Assistant

## 2019-12-25 VITALS — BP 155/76 | HR 58 | Temp 97.8°F | Wt 129.8 lb

## 2019-12-25 DIAGNOSIS — F419 Anxiety disorder, unspecified: Secondary | ICD-10-CM | POA: Diagnosis not present

## 2019-12-25 DIAGNOSIS — Z23 Encounter for immunization: Secondary | ICD-10-CM

## 2019-12-25 MED ORDER — BUSPIRONE HCL 7.5 MG PO TABS: 7.5000 mg | ORAL_TABLET | Freq: Two times a day (BID) | ORAL | 1 refills | Status: AC

## 2019-12-25 NOTE — Progress Notes (Signed)
Established patient visit   Patient: Amber Hayes   DOB: Apr 21, 1955   64 y.o. Female  MRN: [id number] Visit Date: 12/25/2019  Today's healthcare provider: Trey Sailors, PA-C   Chief Complaint  Patient presents with  . Anxiety  I,Jayjay Littles M Isaak Delmundo,acting as a scribe for Trey Sailors, PA-C.,have documented all relevant documentation on the behalf of Trey Sailors, PA-C,as directed by  Trey Sailors, PA-C while in the presence of Trey Sailors, PA-C.  Subjective    HPI  Anxiety, Follow-up  She was last seen for anxiety 4 months ago. Changes made at last visit include added Buspar 7.5 mg.   She reports good compliance with treatment. She reports good tolerance of treatment. She is not having side effects.   She feels her anxiety is mild and Improved since last visit.  Symptoms: No chest pain No difficulty concentrating  No dizziness No fatigue  No feelings of losing control No insomnia  No irritable No palpitations  No panic attacks No racing thoughts  No shortness of breath No sweating  No tremors/shakes    GAD-7 Results GAD-7 Generalized Anxiety Disorder Screening Tool 12/25/2019 08/09/2019 02/08/2018  1. Feeling Nervous, Anxious, or on Edge 0 3 0  2. Not Being Able to Stop or Control Worrying 0 2 0  3. Worrying Too Much About Different Things 0 2 0  4. Trouble Relaxing 1 3 0  5. Being So Restless it's Hard To Sit Still 0 3 0  6. Becoming Easily Annoyed or Irritable 1 3 0  7. Feeling Afraid As If Something Awful Might Happen 0 2 0  Total GAD-7 Score 2 18 0  Difficulty At Work, Home, or Getting  Along With Others? Not difficult at all Somewhat difficult Not difficult at all    PHQ-9 Scores PHQ9 SCORE ONLY 12/25/2019 11/08/2018 02/08/2018  PHQ-9 Total Score 1 1 1     ---------------------------------------------------------------------------------------------------      Medications: Outpatient Medications Prior to Visit  Medication Sig  .  Calcium Carb-Cholecalciferol (CALCIUM 1000 + D PO) Take by mouth.  . citalopram (CELEXA) 10 MG tablet Take 3 tablets by mouth once daily  . Probiotic Product (PROBIOTIC DAILY PO) Take by mouth.   VITAMIN D PO Take by mouth.   . Vitamin D, Ergocalciferol, (DRISDOL) 1.25 MG (50000 UNIT) CAPS capsule Take 1 capsule (50,000 Units total) by mouth every 7 (seven) days.  . [DISCONTINUED] busPIRone (BUSPAR) 7.5 MG tablet Take 1 tablet (7.5 mg total) by mouth 2 (two) times daily.   No facility-administered medications prior to visit.    Review of Systems  Constitutional: Negative.   Cardiovascular: Negative.   Hematological: Negative.   Psychiatric/Behavioral: Negative for agitation, confusion, decreased concentration, self-injury, sleep disturbance and suicidal ideas. The patient is not nervous/anxious.       Objective    BP (!) 155/76 (BP Location: Left Arm, Patient Position: Sitting, Cuff Size: Large)   Pulse (!) 58   Temp 97.8 F (36.6 C) (Oral)   Wt 129 lb 12.8 oz (58.9 kg)   SpO2 100%   BMI 19.74 kg/m    Physical Exam Constitutional:      Appearance: Normal appearance.  Cardiovascular:     Rate and Rhythm: Normal rate and regular rhythm.     Heart sounds: Normal heart sounds.  Pulmonary:     Effort: Pulmonary effort is normal.     Breath sounds: Normal breath sounds.  Skin:    General: Skin  is warm and dry.  Neurological:     General: No focal deficit present.     Mental Status: She is alert and oriented to person, place, and time.  Psychiatric:        Mood and Affect: Mood normal.        Behavior: Behavior normal.       No results found for any visits on 12/25/19.  Assessment & Plan    1. Anxiety  Continue buspar and celexa.  - busPIRone (BUSPAR) 7.5 MG tablet; Take 1 tablet (7.5 mg total) by mouth 2 (two) times daily.  Dispense: 180 tablet; Refill: 1  2. Need for vaccination against Streptococcus pneumoniae  Updated today.    Return in about 6 months  (around 06/24/2020).      ITrey Sailors, PA-C, have reviewed all documentation for this visit. The documentation on 12/26/19 for the exam, diagnosis, procedures, and orders are all accurate and complete.  The entirety of the information documented in the History of Present Illness, Review of Systems and Physical Exam were personally obtained by me. Portions of this information were initially documented by [facility] and reviewed by me for thoroughness and accuracy.     Maryella Shivers  [facility] [phone] (phone) [phone] (fax)  [facility]

## 2020-01-15 ENCOUNTER — Other Ambulatory Visit: Payer: Self-pay

## 2020-01-15 ENCOUNTER — Ambulatory Visit
Admission: RE | Admit: 2020-01-15 | Discharge: 2020-01-15 | Disposition: A | Discharge status: Home / Self Care | Payer: BC Managed Care – PPO | Source: Ambulatory Visit | Attending: Family Medicine | Admitting: Family Medicine

## 2020-01-15 DIAGNOSIS — I700 Atherosclerosis of aorta: Secondary | ICD-10-CM | POA: Diagnosis not present

## 2020-01-15 DIAGNOSIS — K5730 Diverticulosis of large intestine without perforation or abscess without bleeding: Secondary | ICD-10-CM | POA: Diagnosis not present

## 2020-01-15 DIAGNOSIS — R1030 Lower abdominal pain, unspecified: Secondary | ICD-10-CM | POA: Insufficient documentation

## 2020-01-21 ENCOUNTER — Other Ambulatory Visit: Payer: Self-pay | Admitting: Family Medicine

## 2020-03-12 ENCOUNTER — Other Ambulatory Visit: Payer: Self-pay | Admitting: Family Medicine

## 2020-05-10 ENCOUNTER — Other Ambulatory Visit: Payer: Self-pay | Admitting: Family Medicine

## 2020-05-10 NOTE — Telephone Encounter (Signed)
Requested Prescriptions  Pending Prescriptions Disp Refills  . citalopram (CELEXA) 10 MG tablet [Pharmacy Med Name: Citalopram Hydrobromide 10 MG Oral Tablet] 90 tablet 0    Sig: Take 3 tablets by mouth once daily     Psychiatry:  Antidepressants - SSRI Passed - 05/10/2020  7:51 AM      Passed - Valid encounter within last 6 months    Recent Outpatient Visits          4 months ago Anxiety   [facility] Osvaldo Angst M, [place]   9 months ago Anxiety   [facility], [place], [place]   1 year ago Generalized abdominal pain   [facility] [place], Marzella Schlein, [place]   1 year ago Age-related osteoporosis without current pathological fracture   [facility] [place], Marzella Schlein, [place]   1 year ago Encounter for annual physical exam   [facility], Marzella Schlein, [place]

## 2020-05-18 ENCOUNTER — Ambulatory Visit: Payer: Self-pay | Admitting: *Deleted

## 2020-05-18 DIAGNOSIS — J029 Acute pharyngitis, unspecified: Secondary | ICD-10-CM | POA: Diagnosis not present

## 2020-05-18 DIAGNOSIS — K1120 Sialoadenitis, unspecified: Secondary | ICD-10-CM | POA: Diagnosis not present

## 2020-05-18 DIAGNOSIS — W57XXXA Bitten or stung by nonvenomous insect and other nonvenomous arthropods, initial encounter: Secondary | ICD-10-CM | POA: Diagnosis not present

## 2020-05-18 NOTE — Telephone Encounter (Signed)
Pt called stating she has a nodule on her neck that she noticed on 05/16/20 at 1500; the nodule is on the right side of her neck; she denies fever; the pt says she is having "discomfort not pain" to the area; the pt says the area is tender to the touch and swells and shrinks; the area is the size of a golf ball and swells to the size of 2 golf balls; the pt has taken ibuprofen "for swelling"; she denies difficulty swallowing; ' the pt says she started having fatigue on 05/16/20;  the pt also says she pulled a tick off her genital area on 05/14/20 Tuesday morning; she is not sure how long it was there; the pt says she had multiple opportunities to have come in contact with ticks; recommendations made per nurse triage protocol; she verbalized understanding; the pt is seen by Dr Beryle Flock at [facility]; there is no availability within the timeframe per guidelines; pt encouraged to consider being evaluated at Urgent Care; the pt says she will go to Urgent Care for evaluation; will route to office for notification. Reason for Disposition . [1] Red or very tender (to touch) area AND [2] started over 24 hours after the bite  Answer Assessment - Initial Assessment Questions 1. TYPE of TICK: "Is it a wood tick or a deer tick?" If unsure, ask: "What size was the tick?" "Did it look more like a watermelon seed or a poppy seed?"      Watermelon seed 2. LOCATION: "Where is the tick bite located?"      Genital area 3. ONSET: "How long do you think the tick was attached before you removed it?" (Hours or days)      Not sure 4. TETANUS: "When was the last tetanus booster?"      3-4 years ago 5. PREGNANCY: "Is there any chance you are pregnant?" "When was your last menstrual period?"    no  Protocols used: TICK BITE-A-AH

## 2020-05-19 ENCOUNTER — Ambulatory Visit: Payer: BC Managed Care – PPO | Admitting: Family Medicine

## 2020-06-02 DIAGNOSIS — H33312 Horseshoe tear of retina without detachment, left eye: Secondary | ICD-10-CM | POA: Diagnosis not present

## 2020-06-10 DIAGNOSIS — R430 Anosmia: Secondary | ICD-10-CM | POA: Diagnosis not present

## 2020-06-10 DIAGNOSIS — R1310 Dysphagia, unspecified: Secondary | ICD-10-CM | POA: Diagnosis not present

## 2020-06-10 DIAGNOSIS — K1121 Acute sialoadenitis: Secondary | ICD-10-CM | POA: Diagnosis not present

## 2020-06-16 ENCOUNTER — Encounter: Payer: Self-pay | Admitting: Family Medicine

## 2020-06-16 DIAGNOSIS — R1084 Generalized abdominal pain: Secondary | ICD-10-CM

## 2020-06-16 NOTE — Telephone Encounter (Signed)
Please place referral to  GI for lower abdominal pain

## 2020-06-29 ENCOUNTER — Telehealth: Payer: Self-pay | Admitting: Family Medicine

## 2020-06-29 NOTE — Telephone Encounter (Signed)
LMTCB

## 2020-06-29 NOTE — Telephone Encounter (Signed)
Please review for Dr. B ° ° °Thanks,  ° °-Keghan Mcfarren  °

## 2020-06-29 NOTE — Telephone Encounter (Signed)
lmtcb

## 2020-06-29 NOTE — Telephone Encounter (Signed)
Recommend she change to 20mg   1   tablets daily. Can send 30 or 90 prescription, whichever she prefers.

## 2020-06-29 NOTE — Telephone Encounter (Signed)
Pt is calling regarding citalopram (CELEXA) 10 MG tablet [[id number]] - 3x a time a day  [employer] is wanting the pt to take 1 pill a day. Pt reports the pill is only in 10, 20, 40mg . Not 30mg .   Pt is wanting advice on how the medication can be prescribed.[employer]  is wanting  in writting the medication is above the  medical necessity limit. CB- [phone]

## 2020-06-30 MED ORDER — CITALOPRAM HYDROBROMIDE 20 MG PO TABS: 20.0000 mg | ORAL_TABLET | Freq: Every day | ORAL | 0 refills | Status: DC

## 2020-06-30 NOTE — Telephone Encounter (Signed)
Patient advised as below. Patient reports she has been taking 20mg  daily for the past few weeks. Patient reports she has now retired and feels that her symptoms have improved and are now well controled on 20 mg daily.

## 2020-07-13 MED ORDER — CITALOPRAM HYDROBROMIDE 20 MG PO TABS: 20.0000 mg | ORAL_TABLET | Freq: Every day | ORAL | 1 refills | Status: DC

## 2020-07-13 NOTE — Addendum Note (Signed)
Addended by: Fonda Kinder on: 07/13/2020 10:26 AM   Modules accepted: Orders

## 2020-07-13 NOTE — Telephone Encounter (Signed)
Noted. Can send new Rx for Celexa 20mg  daily #90 r1. Thanks!

## 2020-08-12 ENCOUNTER — Ambulatory Visit: Payer: Medicare Other | Admitting: Gastroenterology

## 2020-08-31 ENCOUNTER — Telehealth: Payer: Self-pay | Admitting: Family Medicine

## 2020-08-31 NOTE — Telephone Encounter (Signed)
[facility] faxed refill request for the following medications:    busPIRone (BUSPAR) 7.5 MG tablet   NOT ON CURRENT MED LIST   Please advise.

## 2020-09-01 ENCOUNTER — Other Ambulatory Visit: Payer: Self-pay

## 2020-09-01 MED ORDER — BUSPIRONE HCL 7.5 MG PO TABS: 7.5000 mg | ORAL_TABLET | Freq: Two times a day (BID) | ORAL | 5 refills | Status: DC

## 2020-09-01 NOTE — Telephone Encounter (Signed)
Yes please send refill x89m

## 2020-09-01 NOTE — Telephone Encounter (Signed)
I did confirm with the patient that she is on this and Citalopram.  She said she has been all along.  Not sure why it isn't on the list.   Are you ok with me sending it in. She was last seen in December and is due for follow up

## 2020-09-01 NOTE — Telephone Encounter (Signed)
done

## 2020-09-24 ENCOUNTER — Telehealth: Payer: Self-pay

## 2020-09-24 NOTE — Telephone Encounter (Signed)
Copied from CRM [id number]. Topic: Appointment Scheduling - Scheduling Inquiry for Clinic >> Sep 24, 2020  4:46 PM Randol Kern wrote: Reason for CRM: Pt called and would like to schedule a CPE as soon as possible, scheduling is only allowing a medicare wellness  Best contact: [phone]

## 2020-09-25 NOTE — Telephone Encounter (Signed)
Appt scheduled

## 2020-10-21 DIAGNOSIS — H43392 Other vitreous opacities, left eye: Secondary | ICD-10-CM | POA: Diagnosis not present

## 2020-11-18 ENCOUNTER — Ambulatory Visit: Payer: Medicare Other | Admitting: Podiatry

## 2020-11-18 ENCOUNTER — Other Ambulatory Visit: Payer: Self-pay

## 2020-11-18 DIAGNOSIS — G6289 Other specified polyneuropathies: Secondary | ICD-10-CM

## 2020-11-18 DIAGNOSIS — M2012 Hallux valgus (acquired), left foot: Secondary | ICD-10-CM | POA: Diagnosis not present

## 2020-11-18 DIAGNOSIS — M21612 Bunion of left foot: Secondary | ICD-10-CM | POA: Diagnosis not present

## 2020-11-18 DIAGNOSIS — M67472 Ganglion, left ankle and foot: Secondary | ICD-10-CM

## 2020-11-18 NOTE — Patient Instructions (Signed)
Ask your doctor to check your B12 and B6 and Folate levels.

## 2020-11-19 DIAGNOSIS — H26492 Other secondary cataract, left eye: Secondary | ICD-10-CM | POA: Diagnosis not present

## 2020-11-19 NOTE — Progress Notes (Signed)
  Subjective:  Patient ID: Amber Hayes, female    DOB: [birth year],  MRN: [id number]  Chief Complaint  Patient presents with   Foot Pain      np left foot pain  bone came up  but both feet needsto be look at    65 y.o. female presents with the above complaint. History confirmed with patient.  She has a lump that has developed on the inside of the left foot just above the arch.  Started notice it turned red and irritated a few days ago after she had been walking in equestrian boots.  She also notices numbness and tingling and feeling of cold in both her feet.  Objective:  Physical Exam: warm, good capillary refill, no trophic changes or ulcerative lesions, normal DP and PT pulses, and normal sensory exam. Left Foot: Palpable subcutaneous mass just proximal to the medial eminence of the metatarsal head soft fluctuant and mobile consistent with a ganglion cyst, she has mild hallux valgus deformity and bunion on the medial side of the foot  Assessment:   1. Ganglion cyst of left foot   2. Other polyneuropathy      Plan:  Patient was evaluated and treated and all questions answered.  She has mild hallux valgus deformity on the left foot that is currently not painful just proximal to this there is a small subcutaneous mass that I suspect is a ganglion cyst which I reviewed the etiology and treatment options for her including injection and surgical excision.  Currently its minimally tender and I think likely was irritated and bothered by particular shoe gear.  She will avoid shoes that put pressure on this in the future and return to see me as needed for further intervention on if becomes symptomatic.  The cold feeling numbness and tingling she is experiencing in her toes may likely be early polyneuropathy.  Could be idiopathic or possible B12 deficiency which has not been checked as far she knows.  She will discuss with her PCP to have this checked.  Also recommended folate and B6 check at the  same time.  She has no other risk factors for known metabolic polyneuropathy such as diabetes or alcoholism.  Could be related to the spinal issues as well but most of the back problem she has is more related to her neck and cervical issues.  Other etiology could be Raynaud's syndrome which I discussed etiology medication strategies for as well.  She will return to see me as needed for this and other issues  Return if symptoms worsen or fail to improve.

## 2020-11-22 ENCOUNTER — Telehealth: Payer: Medicare Other | Admitting: Emergency Medicine

## 2020-11-22 DIAGNOSIS — H9203 Otalgia, bilateral: Secondary | ICD-10-CM

## 2020-11-22 DIAGNOSIS — J029 Acute pharyngitis, unspecified: Secondary | ICD-10-CM

## 2020-11-22 DIAGNOSIS — R051 Acute cough: Secondary | ICD-10-CM

## 2020-11-22 MED ORDER — AMOXICILLIN 500 MG PO CAPS: 500.0000 mg | ORAL_CAPSULE | Freq: Two times a day (BID) | ORAL | 0 refills | Status: AC

## 2020-11-22 NOTE — Patient Instructions (Signed)
  Amber Hayes, thank you for joining Rennis Harding, PA-C for today's virtual visit.  While this provider is not your primary care provider (PCP), if your PCP is located in our provider database this encounter information will be shared with them immediately following your visit.  Consent: (Patient) Amber Hayes provided verbal consent for this virtual visit at the beginning of the encounter.  Current Medications:  Current Outpatient Medications:    busPIRone (BUSPAR) 7.5 MG tablet, Take 1 tablet (7.5 mg total) by mouth 2 (two) times daily., Disp: 60 tablet, Rfl: 5   Calcium Carb-Cholecalciferol (CALCIUM 1000 + D PO), Take by mouth., Disp: , Rfl:    citalopram (CELEXA) 20 MG tablet, Take 1 tablet (20 mg total) by mouth daily., Disp: 90 tablet, Rfl: 1   Probiotic Product (PROBIOTIC DAILY PO), Take by mouth. , Disp: , Rfl:    VITAMIN D PO, Take by mouth. , Disp: , Rfl:    Vitamin D, Ergocalciferol, (DRISDOL) 1.25 MG (50000 UNIT) CAPS capsule, Take 1 capsule (50,000 Units total) by mouth every 7 (seven) days., Disp: 12 capsule, Rfl: 0   Medications ordered in this encounter:  No orders of the defined types were placed in this encounter.    *If you need refills on other medications prior to your next appointment, please contact your pharmacy*  Follow-Up: Call back or seek an in-person evaluation if the symptoms worsen or if the condition fails to improve as anticipated.  Other Instructions Get plenty of rest and push fluids Will cover for possible ear infection.   Amoxicillin prescribed.  Take as directed and to completion Use OTC zyrtec for nasal congestion, runny nose, and/or sore throat Use OTC flonase for nasal congestion and runny nose Use medications daily for symptom relief Use OTC medications like ibuprofen or tylenol as needed fever or pain Follow up in person with urgent care or go to the ED if you have any new or worsening symptoms such as fever, worsening cough, shortness  of breath, chest tightness, chest pain, turning blue, changes in mental status, etc...    If you have been instructed to have an in-person evaluation today at a local Urgent Care facility, please use the link below. It will take you to a list of all of our available [facility], including address, phone number and hours of operation. Please do not delay care.  [facility]  If you or a family member do not have a primary care provider, use the link below to schedule a visit and establish care. When you choose a [facility] primary care physician or advanced practice provider, you gain a long-term partner in health. Find a Primary Care Provider  Learn more about [facility]'s in-office and virtual care options: [facility] - Get Care Now

## 2020-11-22 NOTE — Progress Notes (Signed)
I, Rennis Harding, PA-C, attempted to connect with Kenedy Haisley; MRN [id number] on 11/22/20 via Caregility to complete a video urgent care visit. We connected briefly via video, but patient was unable to hear provider.  As such, the patient was contacted by this provider via phone to complete the encounter.     Virtual Visit Consent   Tanzie Rothschild, you are scheduled for a virtual visit with a [facility] provider today.     Just as with appointments in the office, your consent must be obtained to participate.  Your consent will be active for this visit and any virtual visit you may have with one of our providers in the next 365 days.     If you have a MyChart account, a copy of this consent can be sent to you electronically.  All virtual visits are billed to your insurance company just like a traditional visit in the office.    As this is a virtual visit, video technology does not allow for your provider to perform a traditional examination.  This may limit your provider's ability to fully assess your condition.  If your provider identifies any concerns that need to be evaluated in person or the need to arrange testing (such as labs, EKG, etc.), we will make arrangements to do so.     Although advances in technology are sophisticated, we cannot ensure that it will always work on either your end or our end.  If the connection with a video visit is poor, the visit may have to be switched to a telephone visit.  With either a video or telephone visit, we are not always able to ensure that we have a secure connection.     I need to obtain your verbal consent now.   Are you willing to proceed with your visit today?    Cydne Grahn has provided verbal consent on 11/22/2020 for a virtual visit (video or telephone).   Rennis Harding, [place]   Date: 11/22/2020 8:52 AM   Virtual Visit via Video Note   I, Rennis Harding, connected with  Amber Hayes  ([id number], 08-15-1955) on 11/22/20 at  9:00 AM  EDT by a video-enabled telemedicine application and verified that I am speaking with the correct person using two identifiers.  Location: Patient: Virtual Visit Location Patient: Home Provider: Virtual Visit Location Provider: Home Office   I discussed the limitations of evaluation and management by telemedicine and the availability of in person appointments. The patient expressed understanding and agreed to proceed.    History of Present Illness: Amber Hayes is a 65 y.o. who identifies as a female who was assigned female at birth, and is being seen today for cough, ear pain/ pressure, and sore throat, x 5 days.  Admits to sick contacts.  Husband with similar symptoms.  Has tried OTC medications without relief.  Symptoms are made worse with swallowing.  Reports previous symptoms in the past.   Denies fever, SOB, wheezing, chest pain, nausea, changes in bowel or bladder habits.    ROS: As per HPI.  All other pertinent ROS negative.     HPI: HPI  Problems:  Patient Active Problem List   Diagnosis Date Noted   Abdominal pain 12/05/2019   Neck pain 10/25/2019   Chronic constipation 10/25/2019   Age-related osteoporosis without current pathological fracture 12/10/2018   Panic attack 06/23/2017   Cystocele without uterine prolapse 12/29/2016   Urinary frequency 12/28/2016   Vaginal atrophy 11/18/2016   Allergic rhinitis 05/14/2015  Blood pressure elevated without history of HTN 05/14/2015   Decreased libido 05/14/2015   Abnormal bruising 05/14/2015   Pure hypercholesterolemia 05/14/2015   Anxiety 02/16/2015   Atypical chest pain 07/10/2009   Alcohol use 12/05/2007    Allergies: No Known Allergies Medications:  Current Outpatient Medications:    busPIRone (BUSPAR) 7.5 MG tablet, Take 1 tablet (7.5 mg total) by mouth 2 (two) times daily., Disp: 60 tablet, Rfl: 5   Calcium Carb-Cholecalciferol (CALCIUM 1000 + D PO), Take by mouth., Disp: , Rfl:    citalopram (CELEXA) 20 MG tablet,  Take 1 tablet (20 mg total) by mouth daily., Disp: 90 tablet, Rfl: 1   Probiotic Product (PROBIOTIC DAILY PO), Take by mouth. , Disp: , Rfl:    VITAMIN D PO, Take by mouth. , Disp: , Rfl:    Vitamin D, Ergocalciferol, (DRISDOL) 1.25 MG (50000 UNIT) CAPS capsule, Take 1 capsule (50,000 Units total) by mouth every 7 (seven) days., Disp: 12 capsule, Rfl: 0  Observations/Objective: Patient is well-developed, well-nourished in no acute distress.  Resting comfortably at home.  Head is normocephalic, atraumatic.  No labored breathing.  Speech is clear and coherent with logical content.  Patient is alert and oriented at baseline.   Assessment and Plan: There are no diagnoses linked to this encounter. Get plenty of rest and push fluids Will cover for possible ear and/or sinus infection.   Amoxicillin prescribed.  Take as directed and to completion Use OTC zyrtec for nasal congestion, runny nose, and/or sore throat Use OTC flonase for nasal congestion and runny nose Use medications daily for symptom relief Use OTC medications like ibuprofen or tylenol as needed fever or pain Follow up in person with urgent care or go to the ED if you have any new or worsening symptoms such as fever, worsening cough, shortness of breath, chest tightness, chest pain, turning blue, changes in mental status, etc...   Follow Up Instructions: I discussed the assessment and treatment plan with the patient. The patient was provided an opportunity to ask questions and all were answered. The patient agreed with the plan and demonstrated an understanding of the instructions.  A copy of instructions were sent to the patient via MyChart unless otherwise noted below.    The patient was advised to call back or seek an in-person evaluation if the symptoms worsen or if the condition fails to improve as anticipated.  Time:  I spent 10 minutes with the patient via telehealth technology discussing the above problems/concerns.     Rennis Harding, PA-C

## 2021-01-06 NOTE — Progress Notes (Signed)
Annual Wellness Visit     Patient: Amber Hayes, Female    DOB: [birth year], 65 y.o.   MRN: [id number] Visit Date: 01/07/2021  Today's Provider: Shirlee Latch, MD   Chief Complaint  Patient presents with   Welcome to Medicare    Subjective    Rya Rausch is a 65 y.o. female who presents today for her Annual Wellness Visit. She reports consuming a general diet. Home exercise routine includes walking but she is trying to get back to gym. She generally feels well. She reports sleeping well. She does have additional problems to discuss today.   HPI    Medications: Outpatient Medications Prior to Visit  Medication Sig   busPIRone (BUSPAR) 7.5 MG tablet Take 1 tablet (7.5 mg total) by mouth 2 (two) times daily.   citalopram (CELEXA) 20 MG tablet Take 1 tablet (20 mg total) by mouth daily.   [DISCONTINUED] Calcium Carb-Cholecalciferol (CALCIUM 1000 + D PO) Take by mouth.   [DISCONTINUED] Probiotic Product (PROBIOTIC DAILY PO) Take by mouth.    [DISCONTINUED] VITAMIN D PO Take by mouth.    [DISCONTINUED] Vitamin D, Ergocalciferol, (DRISDOL) 1.25 MG (50000 UNIT) CAPS capsule Take 1 capsule (50,000 Units total) by mouth every 7 (seven) days.   No facility-administered medications prior to visit.    No Known Allergies  Patient Care Team: Erasmo Downer, MD as PCP - General (Family Medicine)  Review of Systems  Constitutional: Negative.   HENT:  Positive for congestion and tinnitus.   Eyes: Negative.   Respiratory:  Positive for cough.   Cardiovascular: Negative.   Gastrointestinal: Negative.   Endocrine: Negative.   Genitourinary: Negative.   Musculoskeletal:  Positive for back pain and neck pain.  Skin: Negative.   Allergic/Immunologic: Negative.   Neurological: Negative.   Hematological:  Bruises/bleeds easily.  Psychiatric/Behavioral: Negative.         Objective    Vitals: BP (!) 159/90 (BP Location: Left Arm, Patient Position: Sitting, Cuff  Size: Normal)    Pulse 71    Temp 98.2 F (36.8 C) (Oral)    Resp 16    Ht 5\' 8"  (1.727 m)    Wt 130 lb 14.4 oz (59.4 kg)    BMI 19.90 kg/m    Physical Exam Vitals reviewed.  Constitutional:      General: She is not in acute distress.    Appearance: Normal appearance. She is well-developed. She is not diaphoretic.  HENT:     Head: Normocephalic and atraumatic.     Right Ear: Tympanic membrane, ear canal and external ear normal.     Left Ear: Tympanic membrane, ear canal and external ear normal.     Nose: Nose normal.     Mouth/Throat:     Mouth: Mucous membranes are moist.     Pharynx: Oropharynx is clear. No oropharyngeal exudate.  Eyes:     General: No scleral icterus.    Conjunctiva/sclera: Conjunctivae normal.     Pupils: Pupils are equal, round, and reactive to light.  Neck:     Thyroid: No thyromegaly.  Cardiovascular:     Rate and Rhythm: Normal rate and regular rhythm.     Pulses: Normal pulses.     Heart sounds: Normal heart sounds. No murmur heard. Pulmonary:     Effort: Pulmonary effort is normal. No respiratory distress.     Breath sounds: Normal breath sounds. No wheezing or rales.  Chest:     Comments: Breasts: breasts appear normal, no  suspicious masses, no skin or nipple changes or axillary nodes  Abdominal:     General: There is no distension.     Palpations: Abdomen is soft.     Tenderness: There is no abdominal tenderness.  Musculoskeletal:        General: No deformity.     Cervical back: Neck supple.     Right lower leg: No edema.     Left lower leg: No edema.  Lymphadenopathy:     Cervical: No cervical adenopathy.  Skin:    General: Skin is warm and dry.     Findings: No rash.  Neurological:     Mental Status: She is alert and oriented to person, place, and time. Mental status is at baseline.     Sensory: No sensory deficit.     Motor: No weakness.     Gait: Gait normal.  Psychiatric:        Mood and Affect: Mood normal.        Behavior:  Behavior normal.        Thought Content: Thought content normal.     Most recent functional status assessment: In your present state of health, do you have any difficulty performing the following activities: 01/07/2021  Hearing? N  Vision? N  Difficulty concentrating or making decisions? Y  Walking or climbing stairs? N  Dressing or bathing? N  Doing errands, shopping? N  Some recent data might be hidden   Most recent fall risk assessment: Fall Risk  01/07/2021  Falls in the past year? 0  Number falls in past yr: 0  Injury with Fall? 0  Risk for fall due to : -  Follow up -    Most recent depression screenings: PHQ 2/9 Scores 01/07/2021 12/25/2019  PHQ - 2 Score 2 0  PHQ- 9 Score 6 1   Most recent cognitive screening: No flowsheet data found. Most recent Audit-C alcohol use screening Alcohol Use Disorder Test (AUDIT) 01/07/2021  1. How often do you have a drink containing alcohol? 0  2. How many drinks containing alcohol do you have on a typical day when you are drinking? 0  3. How often do you have six or more drinks on one occasion? 0  AUDIT-C Score 0   A score of 3 or more in women, and 4 or more in men indicates increased risk for alcohol abuse, EXCEPT if all of the points are from question 1   No results found for any visits on 01/07/21.  Assessment & Plan     Annual wellness visit done today including the all of the following: Reviewed patient's Family Medical History Reviewed and updated list of patient's medical providers Assessment of cognitive impairment was done Assessed patient's functional ability Established a written schedule for health screening services Health Risk Assessent Completed and Reviewed  Exercise Activities and Dietary recommendations  Goals   None     Immunization History  Administered Date(s) Administered   PFIZER(Purple Top)SARS-COV-2 Vaccination 03/29/2019, 04/18/2019, 01/05/2020   Pneumococcal Conjugate-13 12/25/2019    Pneumococcal Polysaccharide-23 11/08/2018   Tdap 07/13/2013    Health Maintenance  Topic Date Due   Zoster Vaccines- Shingrix (1 of 2) Never done   COVID-19 Vaccine (4 - Booster for Pfizer series) 03/01/2020   INFLUENZA VACCINE  04/23/2021 (Originally 08/24/2020)   MAMMOGRAM  11/05/2021   TETANUS/TDAP  07/14/2023   COLONOSCOPY (Pts 45-20yrs Insurance coverage will need to be confirmed)  10/18/2028   Pneumonia Vaccine 10+ Years old  Completed  DEXA SCAN  Completed   Hepatitis C Screening  Completed   HPV VACCINES  Aged Out     Discussed health benefits of physical activity, and encouraged her to engage in regular exercise appropriate for her age and condition.    Problem List Items Addressed This Visit   None Visit Diagnoses     Medicare annual wellness visit, initial    -  Primary   Relevant Orders   EKG 12-Lead (Completed)   Encounter for screening mammogram for breast cancer       Relevant Orders   MM 3D SCREEN BREAST BILATERAL        Return in about 6 months (around 07/08/2021) for chronic disease f/u.     I, Shirlee Latch, MD, have reviewed all documentation for this visit. The documentation on 01/07/21 for the exam, diagnosis, procedures, and orders are all accurate and complete.   Hoby Kawai, Marzella Schlein, MD, MPH [facility]

## 2021-01-07 ENCOUNTER — Ambulatory Visit (INDEPENDENT_AMBULATORY_CARE_PROVIDER_SITE_OTHER): Payer: Medicare Other | Admitting: Family Medicine

## 2021-01-07 ENCOUNTER — Encounter: Payer: Self-pay | Admitting: Family Medicine

## 2021-01-07 ENCOUNTER — Other Ambulatory Visit: Payer: Self-pay

## 2021-01-07 VITALS — BP 159/90 | HR 71 | Temp 98.2°F | Resp 16 | Ht 68.0 in | Wt 130.9 lb

## 2021-01-07 DIAGNOSIS — Z1231 Encounter for screening mammogram for malignant neoplasm of breast: Secondary | ICD-10-CM | POA: Diagnosis not present

## 2021-01-07 DIAGNOSIS — Z0000 Encounter for general adult medical examination without abnormal findings: Secondary | ICD-10-CM | POA: Diagnosis not present

## 2021-01-07 NOTE — Patient Instructions (Signed)
Ask at the pharmacy about shingrix and COVID booster  The CDC recommends two doses of Shingrix (the shingles vaccine) separated by 2 to 6 months for adults age 67 years and older. I recommend checking with your insurance plan regarding coverage for this vaccine.

## 2021-01-11 ENCOUNTER — Other Ambulatory Visit: Payer: Self-pay | Admitting: Family Medicine

## 2021-01-13 NOTE — Progress Notes (Signed)
Established patient visit   Patient: Amber Hayes   DOB: 1956-01-18   65 y.o. Female  MRN: [id number] Visit Date: 01/14/2021  Today's healthcare provider: Shirlee Latch, MD   Chief Complaint  Patient presents with   Follow-up   Subjective    HPI Patient here for 1 week follow up on chronic issues. Elevated BP, follow-up  BP Readings from Last 3 Encounters:  01/14/21 (!) 142/81  01/07/21 (!) 159/90  12/25/19 (!) 155/76   Wt Readings from Last 3 Encounters:  01/07/21 130 lb 14.4 oz (59.4 kg)  12/25/19 129 lb 12.8 oz (58.9 kg)  12/05/19 126 lb (57.2 kg)     She was last seen for hypertension 1 weeks ago.  BP at that visit was 159/90. She is following a Regular diet. She is exercising. ---------------------------------------------------------------------------------------------------  Cough for 2 weeks. Wakes her up at night. Husband is starting to cough now.  Non-productive. No fever.   She reports heaviness in her pelvis ongoing x1 month. Some SUI with cough. No dysuria, fever, urinary urgency/frequency. Nocturia x2  Neuropathy in feet - seeing podiatry. Needs B6 and B12 levels checked   Medications: Outpatient Medications Prior to Visit  Medication Sig   busPIRone (BUSPAR) 7.5 MG tablet Take 1 tablet (7.5 mg total) by mouth 2 (two) times daily.   citalopram (CELEXA) 20 MG tablet Take 1 tablet by mouth once daily   No facility-administered medications prior to visit.    Review of Systems per HPI     Objective    BP (!) 142/81 (BP Location: Right Arm, Patient Position: Sitting, Cuff Size: Small)    Pulse 71    Temp 98.1 F (36.7 C) (Oral)    Resp 16  {Show previous vital signs (optional):23777}  Physical Exam Vitals reviewed.  Constitutional:      General: She is not in acute distress.    Appearance: Normal appearance. She is well-developed. She is not diaphoretic.  HENT:     Head: Normocephalic and atraumatic.  Eyes:     General: No scleral  icterus.    Conjunctiva/sclera: Conjunctivae normal.  Neck:     Thyroid: No thyromegaly.  Cardiovascular:     Rate and Rhythm: Normal rate and regular rhythm.     Pulses: Normal pulses.     Heart sounds: Normal heart sounds. No murmur heard. Pulmonary:     Effort: Pulmonary effort is normal. No respiratory distress.     Breath sounds: Normal breath sounds. No wheezing, rhonchi or rales.  Abdominal:     General: There is no distension.     Palpations: Abdomen is soft.     Tenderness: There is no abdominal tenderness.  Genitourinary:    Labia:        Right: No rash, tenderness, lesion or injury.        Left: No rash, tenderness, lesion or injury.      Cervix: No lesion or cervical bleeding.     Comments: +vaginal atrophy Laxity of upper and lower pelvic floor with increased abd pressure Musculoskeletal:     Cervical back: Neck supple.     Right lower leg: No edema.     Left lower leg: No edema.  Lymphadenopathy:     Cervical: No cervical adenopathy.  Skin:    General: Skin is warm and dry.     Findings: No rash.  Neurological:     Mental Status: She is alert and oriented to person, place, and time. Mental status  is at baseline.  Psychiatric:        Mood and Affect: Mood normal.        Behavior: Behavior normal.      No results found for any visits on 01/14/21.  Assessment & Plan     Problem List Items Addressed This Visit       Nervous and Auditory   Neuropathy    F/b podiatry Check B6 and B12      Relevant Orders   Vitamin B6   B12     Other   Anxiety    Chronic and stable Continue current meds      Blood pressure elevated without history of HTN - Primary    Much better today on manual BP Still slightly elevated Monitor home BPs, DASH diet F/u in 17m and recheck      Relevant Orders   Comprehensive metabolic panel   Pure hypercholesterolemia    Reviewed last lipid panel Not currently on a statin Recheck FLP and CMP Discussed diet and exercise        Relevant Orders   Comprehensive metabolic panel   Lipid panel   Pelvic pressure in female    New problem Some laxity of pelvic floor on exam Will send for pelvic floor PT eval and treatment      Relevant Orders   Ambulatory referral to Physical Therapy   Other Visit Diagnoses     Pelvic floor dysfunction       Relevant Orders   Ambulatory referral to Physical Therapy        Return in about 3 months (around 04/14/2021) for chronic disease f/u, BP f/u.      I, Shirlee Latch, MD, have reviewed all documentation for this visit. The documentation on 01/14/21 for the exam, diagnosis, procedures, and orders are all accurate and complete.   Julie Nay, Marzella Schlein, MD, MPH [facility]

## 2021-01-14 ENCOUNTER — Other Ambulatory Visit: Payer: Self-pay

## 2021-01-14 ENCOUNTER — Ambulatory Visit (INDEPENDENT_AMBULATORY_CARE_PROVIDER_SITE_OTHER): Payer: Medicare Other | Admitting: Family Medicine

## 2021-01-14 ENCOUNTER — Encounter: Payer: Self-pay | Admitting: Family Medicine

## 2021-01-14 VITALS — BP 142/81 | HR 71 | Temp 98.1°F | Resp 16

## 2021-01-14 DIAGNOSIS — R102 Pelvic and perineal pain unspecified side: Secondary | ICD-10-CM | POA: Insufficient documentation

## 2021-01-14 DIAGNOSIS — F419 Anxiety disorder, unspecified: Secondary | ICD-10-CM

## 2021-01-14 DIAGNOSIS — R030 Elevated blood-pressure reading, without diagnosis of hypertension: Secondary | ICD-10-CM | POA: Diagnosis not present

## 2021-01-14 DIAGNOSIS — E7800 Pure hypercholesterolemia, unspecified: Secondary | ICD-10-CM

## 2021-01-14 DIAGNOSIS — G629 Polyneuropathy, unspecified: Secondary | ICD-10-CM | POA: Insufficient documentation

## 2021-01-14 DIAGNOSIS — M6289 Other specified disorders of muscle: Secondary | ICD-10-CM

## 2021-01-14 NOTE — Assessment & Plan Note (Signed)
Much better today on manual BP Still slightly elevated Monitor home BPs, DASH diet F/u in 38m and recheck

## 2021-01-14 NOTE — Assessment & Plan Note (Signed)
Reviewed last lipid panel Not currently on a statin Recheck FLP and CMP Discussed diet and exercise  

## 2021-01-14 NOTE — Assessment & Plan Note (Signed)
Chronic and stable  Continue current meds

## 2021-01-14 NOTE — Assessment & Plan Note (Signed)
F/b podiatry Check B6 and B12

## 2021-01-14 NOTE — Assessment & Plan Note (Signed)
New problem Some laxity of pelvic floor on exam Will send for pelvic floor PT eval and treatment

## 2021-01-15 ENCOUNTER — Ambulatory Visit: Payer: Self-pay | Admitting: *Deleted

## 2021-01-15 DIAGNOSIS — Z20822 Contact with and (suspected) exposure to covid-19: Secondary | ICD-10-CM | POA: Diagnosis not present

## 2021-01-15 NOTE — Telephone Encounter (Signed)
Summary: covid test   Pt calling stating that she's been around someone who is covid positive and just seeking clinical advise on whether she should go get a covid test. Requesting a call back [phone]      Attempted to call patient- see number [phone]- left message to call office Reason for Disposition  [1] CLOSE CONTACT COVID-19 EXPOSURE within last 14 days AND [2] NO symptoms  Protocols used: Coronavirus (COVID-19) Exposure-A-AH

## 2021-01-15 NOTE — Telephone Encounter (Signed)
Answer Assessment - Initial Assessment Questions 1. COVID-19 EXPOSURE: "Please describe how you were exposed to someone with a COVID-19 infection."     Pt exposed to someone with covid on Monday.   I made an appt for covid test.   I've had a cough from RSV a couple of weeks ago.   Dr. Beryle Flock saw me yesterday.     2. PLACE of CONTACT: "Where were you when you were exposed to COVID-19?" (e.g., home, school, medical waiting room; which city?)     With person on Monday 3. TYPE of CONTACT: "How much contact was there?" (e.g., sitting next to, live in same house, work in same office, same building)     Had lunch together over an hour.   4. DURATION of CONTACT: "How long were you in contact with the COVID-19 patient?" (e.g., a few seconds, passed by person, a few minutes, 15 minutes or longer, live with the patient)     An hour 5. MASK: "Were you wearing a mask?" "Was the other person wearing a mask?" Note: wearing a mask reduces the risk of an otherwise close contact.     No  6. DATE of CONTACT: "When did you have contact with a COVID-19 patient?" (e.g., how many days ago)     Monday 7. COMMUNITY SPREAD: "Are there lots of cases of COVID-19 (community spread) where you live?" (See public health department website, if unsure)       Yes 8. SYMPTOMS: "Do you have any symptoms?" (e.g., fever, cough, breathing difficulty, loss of taste or smell)     No    9. VACCINE: "Have you gotten the COVID-19 vaccine?" If Yes, ask: "Which one, how many shots, when did you get it?"     *No Answer* 10. BOOSTER: "Have you received your COVID-19 booster?" If Yes, ask: "Which one and when did you get it?"       *No Answer* 11. PREGNANCY OR POSTPARTUM: "Is there any chance you are pregnant?" "When was your last menstrual period?" "Did you deliver in the last 2 weeks?"       No 12. HIGH RISK: "Do you have any heart or lung problems?" (e.g., asthma , COPD, heart failure) "Do you have a weak immune system or other risk  factors?" (e.g., HIV positive, chemotherapy, renal failure, diabetes mellitus, sickle cell anemia, obesity)       I have a strong immune system.   No high risk 13. TRAVEL: "Have you traveled out of the country recently?" If Yes, ask: "When and where?"  Note: Travel becomes less relevant if there is widespread community transmission where the patient lives.       Not asked  Protocols used: Coronavirus (COVID-19) Exposure-A-AH Answered pt's questions about covid exposure.  She has family coming in for Christmas.   She thanked me for my help after going over the care advice.

## 2021-01-16 LAB — COMPREHENSIVE METABOLIC PANEL
ALT: 14 IU/L (ref 0–32)
AST: 19 IU/L (ref 0–40)
Albumin/Globulin Ratio: 2.1 (ref 1.2–2.2)
Albumin: 4.4 g/dL (ref 3.8–4.8)
Alkaline Phosphatase: 115 IU/L (ref 44–121)
BUN/Creatinine Ratio: 21 (ref 12–28)
BUN: 17 mg/dL (ref 8–27)
Bilirubin Total: 0.4 mg/dL (ref 0.0–1.2)
CO2: 24 mmol/L (ref 20–29)
Calcium: 9.1 mg/dL (ref 8.7–10.3)
Chloride: 103 mmol/L (ref 96–106)
Creatinine, Ser: 0.82 mg/dL (ref 0.57–1.00)
Globulin, Total: 2.1 g/dL (ref 1.5–4.5)
Glucose: 83 mg/dL (ref 70–99)
Potassium: 4.6 mmol/L (ref 3.5–5.2)
Sodium: 143 mmol/L (ref 134–144)
Total Protein: 6.5 g/dL (ref 6.0–8.5)
eGFR: 78 mL/min/{1.73_m2} (ref >59)

## 2021-01-16 LAB — LIPID PANEL
Chol/HDL Ratio: 3 ratio (ref 0.0–4.4)
Cholesterol, Total: 211 mg/dL — ABNORMAL HIGH (ref 100–199)
HDL: 71 mg/dL (ref >39)
LDL Chol Calc (NIH): 129 mg/dL — ABNORMAL HIGH (ref 0–99)
Triglycerides: 62 mg/dL (ref 0–149)
VLDL Cholesterol Cal: 11 mg/dL (ref 5–40)

## 2021-01-16 LAB — VITAMIN B12: Vitamin B-12: 403 pg/mL (ref 232–1245)

## 2021-01-16 LAB — VITAMIN B6: Vitamin B6: 11.7 ug/L (ref 3.4–65.2)

## 2021-01-19 DIAGNOSIS — H26492 Other secondary cataract, left eye: Secondary | ICD-10-CM | POA: Diagnosis not present

## 2021-01-27 ENCOUNTER — Telehealth: Payer: Self-pay

## 2021-01-27 ENCOUNTER — Ambulatory Visit: Payer: Self-pay

## 2021-01-27 NOTE — Telephone Encounter (Signed)
Chief Complaint: Cough worse past couple days Symptoms: Body aches w/fever yesterday 101, no fever today, pain in both ears Frequency: Onset worse past couple days Pertinent Negatives: Patient denies SOB Disposition: [] ED /[] Urgent Care (no appt availability in office) / [x] Appointment(In office/virtual)/ []  [facility]/ [] Home Care/ [] Refused Recommended Disposition /[] [facility]/ []  Follow-up with PCP Additional Notes: Denies taking COVID tests, she says she's not sure of the home results and would prefer testing in the office, if needed.     Summary: cough,body aches, pain in both ears.   Pt stated she has been sick for about a month. Pt stated had a fever yesterday (101.00) no fever today.  Pt stated has a really bad cough, body aches, pain in both ears.   Pt seeking clinical advice. Possibly getting medication.  No in office appointments until 02/02/2021.      Reason for Disposition  [1] Continuous (nonstop) coughing interferes with work or school AND [2] no improvement using cough treatment per Care Advice  Answer Assessment - Initial Assessment Questions 1. ONSET: "When did the cough begin?"      Ongoing for a month, gotten worse in the last few days 2. SEVERITY: "How bad is the cough today?"      Can go several hours, then it just starts, worst lying down 3. SPUTUM: "Describe the color of your sputum" (none, dry cough; clear, white, yellow, green)     Nothing, dry hacking cough 4. HEMOPTYSIS: "Are you coughing up any blood?" If so ask: "How much?" (flecks, streaks, tablespoons, etc.)     N/A 5. DIFFICULTY BREATHING: "Are you having difficulty breathing?" If Yes, ask: "How bad is it?" (e.g., mild, moderate, severe)    - MILD: No SOB at rest, mild SOB with walking, speaks normally in sentences, can lie down, no retractions, pulse < 100.    - MODERATE: SOB at rest, SOB with minimal exertion and prefers to sit, cannot lie down flat, speaks in phrases, mild  retractions, audible wheezing, pulse 100-120.    - SEVERE: Very SOB at rest, speaks in single words, struggling to breathe, sitting hunched forward, retractions, pulse > 120      No 6. FEVER: "Do you have a fever?" If Yes, ask: "What is your temperature, how was it measured, and when did it start?"     Yesterday 101; today temp  7. CARDIAC HISTORY: "Do you have any history of heart disease?" (e.g., heart attack, congestive heart failure)      N/A 8. LUNG HISTORY: "Do you have any history of lung disease?"  (e.g., pulmonary embolus, asthma, emphysema)     N/a 9. PE RISK FACTORS: "Do you have a history of blood clots?" (or: recent major surgery, recent prolonged travel, bedridden)     N/A 10. OTHER SYMPTOMS: "Do you have any other symptoms?" (e.g., runny nose, wheezing, chest pain)       Bilateral ear pain, body aches (yesterday), chills (yesterday) 11. PREGNANCY: "Is there any chance you are pregnant?" "When was your last menstrual period?"       No 12. TRAVEL: "Have you traveled out of the country in the last month?" (e.g., travel history, exposures)       N/A  Protocols used: Cough - Acute Non-Productive-A-AH

## 2021-01-27 NOTE — Telephone Encounter (Signed)
Copied from CRM [id number]. Topic: Appointment Scheduling - Scheduling Inquiry for Clinic >> Jan 27, 2021  5:00 PM Randol Kern wrote: Reason for CRM: Pt has tested positive for Covid, she wants to know if office needs her to come in and take another test. Please advise  Best contact: [phone]

## 2021-01-28 ENCOUNTER — Other Ambulatory Visit: Payer: Self-pay

## 2021-01-28 ENCOUNTER — Ambulatory Visit: Payer: Medicare Other | Admitting: Family Medicine

## 2021-01-28 ENCOUNTER — Telehealth: Payer: Self-pay

## 2021-01-28 ENCOUNTER — Telehealth: Payer: Self-pay | Admitting: Pharmacist

## 2021-01-28 MED ORDER — PAXLOVID (300/100) 20 X 150 MG & 10 X 100MG PO TBPK
ORAL_TABLET | ORAL | 0 refills | Status: DC
  Filled 2021-01-28: qty 30, 30d supply, fill #0

## 2021-01-28 NOTE — Telephone Encounter (Signed)
Copied from CRM [id number]. Topic: Appointment Scheduling - Scheduling Inquiry for Clinic >> Jan 28, 2021 10:19 AM Amber Hayes wrote: Reason for CRM: patient states he was dx with COVID last night and wanted to know if she can still come in office at 4pm for her appointment.

## 2021-01-28 NOTE — Telephone Encounter (Signed)
Patient advised as below. She will call [facility] pharmacy [phone]. We will keep appt for now in case she is not able to get antiviral.

## 2021-01-28 NOTE — Telephone Encounter (Signed)
We don't need her to come in to test again. She has an appt at 4pm today. We could do that virtually to save her the trip. She can go ahead and call [facility] pharmacy to get Paxlovid if she wants ahead or instead of the visit

## 2021-01-28 NOTE — Telephone Encounter (Signed)
Patient canceled appt. Patient was given number to [facility] pharmacy to antiviral treatment.

## 2021-01-28 NOTE — Telephone Encounter (Signed)
Outpatient Pharmacy Oral COVID Treatment Note  I connected with Aniesa Petitjean on 01/28/2021/12:47 PM by telephone and verified that I am speaking with the correct person using two identifiers.  I discussed the limitations, risks, security, and privacy concerns of performing an evaluation and management service by telephone and the availability of in person appointments via referral to a physician. The patient expressed understanding and agreed to proceed.  Pharmacy location: [facility]  Diagnosis: COVID-19 infection  Purpose of visit: Discussion of potential use of Paxlovid, a new treatment for mild to moderate COVID-19 viral infection in non-hospitalized patients.  Subjective/Objective: Patient is a 66 y.o. female who is presenting with COVID 19 viral infection.  COVID 19 viral infection. Their symptoms began on 01/26/21 with sore throat, fever and cough.  The patient has confirmed COVID-19 via a home test on 01/27/21.   Past Medical History:  Diagnosis Date   Osteoporosis     No Known Allergies   Current Outpatient Medications:    busPIRone (BUSPAR) 7.5 MG tablet, Take 1 tablet (7.5 mg total) by mouth 2 (two) times daily., Disp: 60 tablet, Rfl: 5   citalopram (CELEXA) 20 MG tablet, Take 1 tablet by mouth once daily, Disp: 90 tablet, Rfl: 0   nirmatrelvir & ritonavir (PAXLOVID, 300/100,) 20 x 150 MG & 10 x 100MG TBPK, Take 3 tablets by mouth twice a day for 5 days, Disp: 30 tablet, Rfl: 0  Lab Monitoring: eGFR 78  Drug Interactions Noted: Medications were reviewed and pat was instructed to monitor for increased side effects of buspar and reduce dose to 1/2 a tablet.  Plan:  This patient is a 66 y.o. female that meets the criteria for Emergency Use Authorization of Paxlovid. After reviewing the emergency use authorization with the patient, the patient agrees to receive Paxlovid.  Through FDA guidance and current [place] standing order Paxlovid will be prescribed to  the patient.   Patient contacted for counseling on 01/28/21 and verbalized understanding.   Delivery or Pick-Up Date: 01/28/21  Follow up instructions:    Take prescription BID x 5 days as directed Counseling was provided by pharmacist. Reach out to pharmacist with follow up questions For concerns regarding further COVID symptoms please follow up with your PCP or urgent care For urgent or life-threatening issues, seek care at your local emergency department   Letta Median 01/28/2021, 12:47 PM [facility] Pharmacist Phone# [phone]

## 2021-02-10 DIAGNOSIS — M9902 Segmental and somatic dysfunction of thoracic region: Secondary | ICD-10-CM | POA: Diagnosis not present

## 2021-02-10 DIAGNOSIS — M9903 Segmental and somatic dysfunction of lumbar region: Secondary | ICD-10-CM | POA: Diagnosis not present

## 2021-02-10 DIAGNOSIS — M9907 Segmental and somatic dysfunction of upper extremity: Secondary | ICD-10-CM | POA: Diagnosis not present

## 2021-02-10 DIAGNOSIS — M9901 Segmental and somatic dysfunction of cervical region: Secondary | ICD-10-CM | POA: Diagnosis not present

## 2021-02-11 DIAGNOSIS — M9903 Segmental and somatic dysfunction of lumbar region: Secondary | ICD-10-CM | POA: Diagnosis not present

## 2021-02-11 DIAGNOSIS — M9901 Segmental and somatic dysfunction of cervical region: Secondary | ICD-10-CM | POA: Diagnosis not present

## 2021-02-11 DIAGNOSIS — M9902 Segmental and somatic dysfunction of thoracic region: Secondary | ICD-10-CM | POA: Diagnosis not present

## 2021-02-11 DIAGNOSIS — M9907 Segmental and somatic dysfunction of upper extremity: Secondary | ICD-10-CM | POA: Diagnosis not present

## 2021-03-02 DIAGNOSIS — H33312 Horseshoe tear of retina without detachment, left eye: Secondary | ICD-10-CM | POA: Diagnosis not present

## 2021-03-17 ENCOUNTER — Other Ambulatory Visit: Payer: Self-pay | Admitting: Family Medicine

## 2021-03-30 ENCOUNTER — Ambulatory Visit: Payer: Medicare Other | Attending: Family Medicine | Admitting: Physical Therapy

## 2021-03-30 ENCOUNTER — Encounter: Payer: Self-pay | Admitting: Physical Therapy

## 2021-03-30 ENCOUNTER — Other Ambulatory Visit: Payer: Self-pay

## 2021-03-30 DIAGNOSIS — M542 Cervicalgia: Secondary | ICD-10-CM | POA: Insufficient documentation

## 2021-03-30 DIAGNOSIS — M545 Low back pain, unspecified: Secondary | ICD-10-CM | POA: Diagnosis not present

## 2021-03-30 DIAGNOSIS — R2689 Other abnormalities of gait and mobility: Secondary | ICD-10-CM | POA: Insufficient documentation

## 2021-03-30 DIAGNOSIS — R102 Pelvic and perineal pain: Secondary | ICD-10-CM | POA: Diagnosis not present

## 2021-03-30 DIAGNOSIS — G629 Polyneuropathy, unspecified: Secondary | ICD-10-CM | POA: Insufficient documentation

## 2021-03-30 DIAGNOSIS — R279 Unspecified lack of coordination: Secondary | ICD-10-CM | POA: Insufficient documentation

## 2021-03-30 DIAGNOSIS — M533 Sacrococcygeal disorders, not elsewhere classified: Secondary | ICD-10-CM | POA: Diagnosis not present

## 2021-03-30 DIAGNOSIS — M6289 Other specified disorders of muscle: Secondary | ICD-10-CM | POA: Diagnosis not present

## 2021-03-30 NOTE — Patient Instructions (Signed)
?  Lengthen Back rib by L  shoulder  ?  ?Lie on R  side , pillow between knees and under head  ?Pull  arm overhead over mattress, grab the edge of mattress,pull it upward, drawing elbow away from ears  ?Breathing ?10 reps ? ?Open book (handout)  ?Lying on  _R  side , rotating  _L_ only this week  ?Rotating onto pillow /yoga block  ?Pillow/ Block between knees  ?10 reps ? ?__ ? ? ?

## 2021-03-30 NOTE — Therapy (Signed)
[place] MAIN [facility] SERVICES [address] [place], [place], [address] Phone: [phone]   Fax:  [phone]  Physical Therapy Evaluation  Patient Details  Name: Amber Hayes MRN: [id number] Date of Birth: 01-20-1956 No data recorded  Encounter Date: 03/30/2021   PT End of Session - 03/30/21 1316     Visit Number 1    Number of Visits 10    Date for PT Re-Evaluation 06/08/21    PT Start Time [id number]    PT Stop Time 1400    PT Time Calculation (min) 56 min    Activity Tolerance Patient tolerated treatment well    Behavior During Therapy [facility] for tasks assessed/performed             Past Medical History:  Diagnosis Date   Osteoporosis     Past Surgical History:  Procedure Laterality Date   AUGMENTATION MAMMAPLASTY Bilateral 2003   CATARACT EXTRACTION W/PHACO Right 02/13/2019   Procedure: CATARACT EXTRACTION PHACO AND INTRAOCULAR LENS PLACEMENT (IOC) RIGHT 3.81 00:38.5 9.9%;  Surgeon: Leandrew Koyanagi, MD;  Location: [place];  Service: Ophthalmology;  Laterality: Right;   CATARACT EXTRACTION W/PHACO Left 04/10/2019   Procedure: CATARACT EXTRACTION PHACO AND INTRAOCULAR LENS PLACEMENT (IOC) LEFT 2.02 00:27.3 7.4%;  Surgeon: Leandrew Koyanagi, MD;  Location: [place];  Service: Ophthalmology;  Laterality: Left;   COLONOSCOPY WITH PROPOFOL N/A 10/19/2018   Procedure: COLONOSCOPY WITH PROPOFOL;  Surgeon: Jonathon Bellows, MD;  Location: [facility];  Service: Gastroenterology;  Laterality: N/A;   EYE SURGERY  1999   Lasik   EYE SURGERY  2017   Torn Retina    There were no vitals filed for this visit.    Subjective Assessment - 03/30/21 1347     Subjective 1) pelvic pressure: pt noticed pressure 6 month ago at the low abdominal area. Pt thought it was at first it was a hernia. In the last month, pt has not noticed it, or it has gotten better, or she has gotten used to it.  Pt feels she has not emptied all the  way with urination and bowel movements.     2) LBP : located at L/S ( pt points to this area) and is painful after standing to cook, sometimes the pain is at the upper back, 7/10 radiating to heel on RLE    3) neck pain/ HA occurs 2-3 x week, wakes her up in the morning, located at the posterior neck to occiput      ( pt points to area) . Pt recently decreased pillow height which is helping alot    4) numbness in her feet after retirement in May 2022. Numbness in B under all toes top and bottom occurs when sitting.    Perintent Hx: Modes of exercise: walking 4-5 miles across 3-4 x week with dog,  core exericses: used to do [employer] , step classes, Body sculpting classes. Pt stopped going to her group classes for the past 9 month due to caring for a new puppy. Pt has done kegel exericses. Pt tried to follow exerisces on line for pelvic floor. Pt has anxiety and is not sure about breathing properly. 2 Vaginal deliveries with perineal tears/ epiosiotomy    Pertinent History osteoporosis,retired from a desk job, fall onto her tailbone as a kid.    Patient Stated Goals sit in a chair comfortable, read comfortably without neck  [facility] PT Assessment - 03/30/21 1356       Palpation   Spinal mobility hypomobile T/L junction L medial scapula / interspinals    SI assessment  R shoulder, iliac crest lower standing, supine: malleoli L ankle ASIS higher      Ambulation/Gait   Gait Comments excessive sway of R hip, limited posterior rotation of L pelvis                        Objective measurements completed on examination: See above findings.     Pelvic Floor Special Questions - 03/30/21 1404     Diastasis Recti 75fingers width along linea alba              [facility] Adult PT Treatment/Exercise - 03/30/21 1356       Therapeutic Activites    Other Therapeutic Activities explained anatomy/physiology, deep core system, goals setting,      Neuro Re-ed     Neuro Re-ed Details  cued for exercises and body mechanics      Manual Therapy   Manual therapy comments STM/MWM at T/L junction to promote medial alignment                          PT Long Term Goals - 03/30/21 1332       PT LONG TERM GOAL #1   Title Pt wil report she emptied all the way with urination and bowel movements across 75% of the time in order in order to practice proper hygiene    Time 8    Period Weeks    Status New    Target Date 05/25/21      PT LONG TERM GOAL #2   Title Pt will demo proper deep core coordination without cues to correct dyscoordination of deep core    Time 4    Period Weeks    Status New    Target Date 04/27/21      PT LONG TERM GOAL #3   Title Pt will demo proper body mechanics with against gravity tasks ( dog care, lifting dog food bags, hiking, biking hills) and fitness routine to minimize ab/ pelvic straining    Time 8    Period Weeks    Status New    Target Date 05/25/21      PT LONG TERM GOAL #4   Title sit in a chair comfortably, read comfortably, wake up without neck pain/ decreased HA by 50%  in order to enjoy her hobby    Baseline pain in neck , 9/10    Time 8    Period Weeks    Status New    Target Date 05/25/21      PT LONG TERM GOAL #5   Title Pt will report being able to stand and cook for 1  hour with < 4/10 LBP    Baseline 7/10    Time 4    Period Weeks    Status New    Target Date 04/27/21      Additional Long Term Goals   Additional Long Term Goals Yes       PT LONG TERM GOAL #6   Title Pt will report decreased Numbness in B under all toes top and bottom occurs when sitting across 75% of the time in order to promote better circulation    Time 10    Period Weeks    Status New  Target Date 06/08/21      PT LONG TERM GOAL #7   Title Pt will demo increased strength on RLE from 3/5 to > 4/5 and L knee flexion from 3/5 to > 4/5 in order to walk with less risk for falls.    Time 4    Period Weeks     Status New    Target Date 04/27/21      PT LONG TERM GOAL #8   Title Pt will demo > 2  pt change for FOTO score Lumbar ( 83 pt to < 81 pts ) and PFDI Prolapse 4 pts to <2 pts) in order to improve QOL and ADLs    Time 10    Period Weeks    Status New    Target Date 06/08/21                    Plan - 03/30/21 1629     Clinical Impression Statement  Pt is a  66  yo  who presents with low abdominal pressure sensations, LBP, numbness of B toes, and neck pain/ HAs which impact her QOL and ADLs.   Pt's musculoskeletal assessment revealed unequal alignment of pelvic girdle, limited posterior rotation of pelvic girdle, hypomobile at T/L junction, diastasis recti, dyscoordination of pelvic floor mm with recruitment of gluts/ ab mm, hip weakness, and poor body mechanics which places strain on the abdominal/pelvic floor mm.   These are deficits that indicate an ineffective intraabdominal pressure system associated with increased risk for pt's Sx.   Pt will benefit from coordination training and education on fitness and functional positions in order to gain a more effective intraabdominal pressure system to minimize Sx.  Pt was provided education on etiology of Sx with anatomy, physiology explanation with images along with the benefits of customized pelvic PT Tx based on pt's medical conditions and musculoskeletal deficits.  Explained the physiology of deep core mm coordination and roles of pelvic floor function in urination, defecation, sexual function, and postural control with deep core mm system.   Regional interdependent approaches will yield greater benefits in pt's POC.  Following Tx today which pt tolerated without complaints, pt demo'd equal alignment of pelvic girdle and more reciprocal gait pattern post Tx. Plan to progress with deep core coordination next session.  Pt continues to benefit from skilled PT.     Personal Factors and Comorbidities Fitness    Examination-Activity  Limitations Locomotion Level;Stand;Other    Stability/Clinical Decision Making Evolving/Moderate complexity    Clinical Decision Making Moderate    Rehab Potential Good    PT Frequency 1x / week    PT Duration Other (comment)   10   PT Treatment/Interventions Neuromuscular re-education;Functional mobility training;Stair training;Gait training;Scar mobilization;Manual techniques;Patient/family education;Therapeutic activities;Therapeutic exercise;Splinting;Taping;Joint Manipulations;Spinal Manipulations;Dry needling;Energy conservation;Moist Heat;Traction;Cryotherapy;Canalith Repostioning;ADLs/Self Care Home Management;Balance training    Consulted and Agree with Plan of Care Patient             Patient will benefit from skilled therapeutic intervention in order to improve the following deficits and impairments:  Decreased strength, Decreased range of motion, Decreased endurance, Decreased activity tolerance, Decreased balance, Impaired flexibility, Decreased coordination, Abnormal gait, Hypermobility, Improper body mechanics, Pain, Increased muscle spasms, Postural dysfunction, Decreased scar mobility, Decreased mobility, Hypomobility, Difficulty walking  Visit Diagnosis: Unspecified lack of coordination  Sacrococcygeal disorders, not elsewhere classified  Other abnormalities of gait and mobility  Lumbar pain  Neck pain  Neuropathy     Problem List Patient Active Problem  List   Diagnosis Date Noted   Neuropathy 01/14/2021   Pelvic pressure in female 01/14/2021   Abdominal pain 12/05/2019   Neck pain 10/25/2019   Chronic constipation 10/25/2019   Age-related osteoporosis without current pathological fracture 12/10/2018   Panic attack 06/23/2017   Cystocele without uterine prolapse 12/29/2016   Vaginal atrophy 11/18/2016   Allergic rhinitis 05/14/2015   Blood pressure elevated without history of HTN 05/14/2015   Decreased libido 05/14/2015   Abnormal bruising 05/14/2015    Pure hypercholesterolemia 05/14/2015   Anxiety 02/16/2015   Atypical chest pain 07/10/2009   Alcohol use 12/05/2007    Jerl Mina, PT 03/30/2021, 4:50 PM  [place] MAIN [facility] SERVICES [place], [place], [address] Phone: [phone]   Fax:  [phone]  Name: Amber Hayes MRN: [id number] Date of Birth: [birth year]

## 2021-04-02 ENCOUNTER — Ambulatory Visit: Payer: Medicare Other | Admitting: Physical Therapy

## 2021-04-09 ENCOUNTER — Ambulatory Visit: Payer: Medicare Other | Admitting: Physical Therapy

## 2021-04-14 DIAGNOSIS — H43391 Other vitreous opacities, right eye: Secondary | ICD-10-CM | POA: Diagnosis not present

## 2021-04-14 DIAGNOSIS — H43811 Vitreous degeneration, right eye: Secondary | ICD-10-CM | POA: Diagnosis not present

## 2021-04-15 DIAGNOSIS — H43391 Other vitreous opacities, right eye: Secondary | ICD-10-CM | POA: Diagnosis not present

## 2021-04-16 ENCOUNTER — Encounter: Payer: Medicare Other | Admitting: Physical Therapy

## 2021-04-17 ENCOUNTER — Other Ambulatory Visit: Payer: Self-pay | Admitting: Family Medicine

## 2021-04-22 ENCOUNTER — Ambulatory Visit: Payer: Medicare Other | Admitting: Family Medicine

## 2021-04-23 ENCOUNTER — Ambulatory Visit: Payer: Medicare Other | Admitting: Family Medicine

## 2021-04-27 ENCOUNTER — Ambulatory Visit: Payer: Medicare Other | Admitting: Physical Therapy

## 2021-04-29 ENCOUNTER — Ambulatory Visit: Payer: Medicare Other | Attending: Family Medicine | Admitting: Physical Therapy

## 2021-04-29 DIAGNOSIS — M533 Sacrococcygeal disorders, not elsewhere classified: Secondary | ICD-10-CM | POA: Diagnosis not present

## 2021-04-29 DIAGNOSIS — M545 Low back pain, unspecified: Secondary | ICD-10-CM | POA: Diagnosis not present

## 2021-04-29 DIAGNOSIS — R2689 Other abnormalities of gait and mobility: Secondary | ICD-10-CM | POA: Insufficient documentation

## 2021-04-29 DIAGNOSIS — M542 Cervicalgia: Secondary | ICD-10-CM | POA: Diagnosis not present

## 2021-04-29 DIAGNOSIS — R279 Unspecified lack of coordination: Secondary | ICD-10-CM | POA: Insufficient documentation

## 2021-04-29 NOTE — Patient Instructions (Addendum)
?   Side of hip stretch: ? ?Reclined twist for hips and side of the hips/ legs ? ?Lay on your back, knees bend ?Scoot hips to the R , leave shoulders in place ?Drop knees to the L side ?resting onto pillows to keep leg at the same width of hips ?Pillow under L thigh to minimize too much strain   ? ?___ ? ?Use strap at [place] of R foot and bend knee and straighten to length hamstring and gluts  ? ?__ ? ?Put shoes and socks on with body at 45 deg turn on bench, to get the same stretch of inner thighs and to not place pressure onto pelvic floor  ? ?___ ? ?Catch yourself no crossing legs. Not standing with pressure with one leg with hip shifted ?

## 2021-04-29 NOTE — Therapy (Addendum)
[place] ?[facility] ?[address] ?[place], [place], [address] ?Phone: [phone]   Fax:  [phone] ? ?Physical Therapy Treatment ? ?Patient Details  ?Name: Amber Hayes ?MRN: [id number] ?Date of Birth: [birth year] ?No data recorded ? ?Encounter Date: 04/29/2021 ? ? PT End of Session - 04/29/21 0856   ? ? Visit Number 2   ? Number of Visits 10   ? Date for PT Re-Evaluation 06/08/21   ? PT Start Time 0809   ? PT Stop Time 0900   ? PT Time Calculation (min) 51 min   ? Activity Tolerance Patient tolerated treatment well   ? Behavior During Therapy [facility] for tasks assessed/performed   ? ?  ?  ? ?  ? ? ?Past Medical History:  ?Diagnosis Date  ? Osteoporosis   ? ? ?Past Surgical History:  ?Procedure Laterality Date  ? AUGMENTATION MAMMAPLASTY Bilateral 2003  ? CATARACT EXTRACTION W/PHACO Right 02/13/2019  ? Procedure: CATARACT EXTRACTION PHACO AND INTRAOCULAR LENS PLACEMENT (IOC) RIGHT 3.81 00:38.5 9.9%;  Surgeon: Lockie Mola, MD;  Location: [facility];  Service: Ophthalmology;  Laterality: Right;  ? CATARACT EXTRACTION W/PHACO Left 04/10/2019  ? Procedure: CATARACT EXTRACTION PHACO AND INTRAOCULAR LENS PLACEMENT (IOC) LEFT 2.02 00:27.3 7.4%;  Surgeon: Lockie Mola, MD;  Location: [facility];  Service: Ophthalmology;  Laterality: Left;  ? COLONOSCOPY WITH PROPOFOL N/A 10/19/2018  ? Procedure: COLONOSCOPY WITH PROPOFOL;  Surgeon: Wyline Mood, MD;  Location: [facility];  Service: Gastroenterology;  Laterality: N/A;  ? EYE SURGERY  1999  ? Lasik  ? EYE SURGERY  2017  ? Torn Retina  ? ? ?There were no vitals filed for this visit. ? ? Subjective Assessment - 04/29/21 0812   ? ? Subjective Pt stated her R hip started hurting 2 days after last session. Pt is not sure if it has to do with training her puppy. The  R hip pain lasted for a couple days. It was uncomfortable and did not radiate donw the leg. 6 months ago, she noticed R hip bothers her  with long drives   ? 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   ? Patient Stated Goals sit in a chair comfortable, read comfortably without neck   ? ?  ?  ? ?  ? ? ? ? ? [facility] PT Assessment - 04/29/21 0813   ? ?  ? Observation/Other Assessments  ? Observations excessive lumbar lordosis   ?  ? Palpation  ? SI assessment  hypomobile SIJ R, tightness along hamstring, adductors, lateral leg R   ? Palpation comment shoulders levelled, R iliac crest higher   ?  ? Ambulation/Gait  ? Gait Comments reciporcal rotation of pelvis   ? ?  ?  ? ?  ? ? ? ? ? ? ? ? ? ? ? ? ? ? ? ? [facility] Adult PT Treatment/Exercise - 04/29/21 0813   ? ?  ? Neuro Re-ed   ? Neuro Re-ed Details  cued for stretches   ?  ? Modalities  ? Modalities Moist Heat   ?  ? Moist Heat Therapy  ? Number Minutes Moist Heat 8 Minutes   ? Moist Heat Location --   supported butterfly pose, guided restorative practice, guided body scan. mindfulness practice for relaxation  ?  ? Manual Therapy  ? Manual therapy comments R LE long axis distraction, STM/MWM to promote ER of RLE and mobility at  R SIJ , addressing hypomobile SIJ R, tightness along hamstring, adductors, lateral leg R   ? ?  ?  ? ?  ? ? ? ? ? ? ? ? ? ? ? ? ? ? ? PT Long Term Goals - 03/30/21 1332   ? ?  ? PT LONG TERM GOAL #1  ? Title Pt wil report she emptied all the way with urination and bowel movements across 75% of the time in order in order to practice proper hygiene   ? Time 8   ? Period Weeks   ? Status New   ? Target Date 05/25/21   ?  ? PT LONG TERM GOAL #2  ? Title Pt will demo proper deep core coordination without cues to correct dyscoordination of deep core   ? Time 4   ? Period Weeks   ? Status New   ? Target Date 04/27/21   ?  ? PT LONG TERM GOAL #3  ? Title Pt will demo proper body mechanics with against gravity tasks ( dog care, lifting dog food bags, hiking, biking hills) and fitness routine to minimize ab/ pelvic straining   ? Time 8   ? Period  Weeks   ? Status New   ? Target Date 05/25/21   ?  ? PT LONG TERM GOAL #4  ? Title sit in a chair comfortably, read comfortably, wake up without neck pain/ decreased HA by 50%  in order to enjoy her hobby   ? Baseline pain in neck , 9/10   ? Time 8   ? Period Weeks   ? Status New   ? Target Date 05/25/21   ?  ? PT LONG TERM GOAL #5  ? Title Pt will report being able to stand and cook for 1  hour with < 4/10 LBP   ? Baseline 7/10   ? Time 4   ? Period Weeks   ? Status New   ? Target Date 04/27/21   ?  ? Additional Long Term Goals  ? Additional Long Term Goals Yes    ?  ? PT LONG TERM GOAL #6  ? Title Pt will report decreased Numbness in B under all toes top and bottom occurs when sitting across 75% of the time in order to promote better circulation   ? Time 10   ? Period Weeks   ? Status New   ? Target Date 06/08/21   ?  ? PT LONG TERM GOAL #7  ? Title Pt will demo increased strength on RLE from 3/5 to > 4/5 and L knee flexion from 3/5 to > 4/5 in order to walk with less risk for falls.   ? Time 4   ? Period Weeks   ? Status New   ? Target Date 04/27/21   ?  ? PT LONG TERM GOAL #8  ? Title Pt will demo > 2  pt change for FOTO score Lumbar ( 83 pt to < 81 pts ) and PFDI Prolapse 4 pts to <2 pts) in order to improve QOL and ADLs   ? Time 10   ? Period Weeks   ? Status New   ? Target Date 06/08/21   ? ?  ?  ? ?  ? ? ? ? ? ? ? ? Plan - 04/29/21 0856   ? ? Clinical Impression Statement Pt showed better alignment in spine but required manual to address hypomobile SIJ R, tightness along hamstring, adductors, lateral  leg R.  Pt reported no more R hip achiness after Tx and showed equal alignment of iliac crest compared to R side being higher pre Tx. Pt was guided through relaxation and stretches for hip mobility and to apply stretch with doffing/ donning shoes to minimize pelvic pressure. Pt continues top benefit from skilled PT.  Pt arrived 9 min and thus, session was abbreviated.   ? Personal Factors and Comorbidities  Fitness   ? Examination-Activity Limitations Locomotion Level;Stand;Other   ? Stability/Clinical Decision Making Evolving/Moderate complexity   ? Rehab Potential Good   ? PT Frequency 1x / week   ? PT Duration Other (comment)   10  ? PT Treatment/Interventions Neuromuscular re-education;Functional mobility training;Stair training;Gait training;Scar mobilization;Manual techniques;Patient/family education;Therapeutic activities;Therapeutic exercise;Splinting;Taping;Joint Manipulations;Spinal Manipulations;Dry needling;Energy conservation;Moist Heat;Traction;Cryotherapy;Canalith Repostioning;ADLs/Self Care Home Management;Balance training   ? Consulted and Agree with Plan of Care Patient   ? ?  ?  ? ?  ? ? ?Patient will benefit from skilled therapeutic intervention in order to improve the following deficits and impairments:  Decreased strength, Decreased range of motion, Decreased endurance, Decreased activity tolerance, Decreased balance, Impaired flexibility, Decreased coordination, Abnormal gait, Hypermobility, Improper body mechanics, Pain, Increased muscle spasms, Postural dysfunction, Decreased scar mobility, Decreased mobility, Hypomobility, Difficulty walking ? ?Visit Diagnosis: ?Unspecified lack of coordination ? ?Sacrococcygeal disorders, not elsewhere classified ? ?Other abnormalities of gait and mobility ? ?Lumbar pain ? ?Neck pain ? ? ? ? ?Problem List ?Patient Active Problem List  ? Diagnosis Date Noted  ? Neuropathy 01/14/2021  ? Pelvic pressure in female 01/14/2021  ? Abdominal pain 12/05/2019  ? Neck pain 10/25/2019  ? Chronic constipation 10/25/2019  ? Age-related osteoporosis without current pathological fracture 12/10/2018  ? Panic attack 06/23/2017  ? Cystocele without uterine prolapse 12/29/2016  ? Vaginal atrophy 11/18/2016  ? Allergic rhinitis 05/14/2015  ? Blood pressure elevated without history of HTN 05/14/2015  ? Decreased libido 05/14/2015  ? Abnormal bruising 05/14/2015  ? Pure  hypercholesterolemia 05/14/2015  ? Anxiety 02/16/2015  ? Atypical chest pain 07/10/2009  ? Alcohol use 12/05/2007  ? ? ?Mariane Masters, PT ?04/29/2021,9:03 AM ? ?[place] ?[facility]

## 2021-04-30 ENCOUNTER — Encounter: Payer: Self-pay | Admitting: Family Medicine

## 2021-04-30 ENCOUNTER — Ambulatory Visit (INDEPENDENT_AMBULATORY_CARE_PROVIDER_SITE_OTHER): Payer: Medicare Other | Admitting: Family Medicine

## 2021-04-30 VITALS — BP 122/86 | HR 71 | Temp 97.6°F | Resp 16 | Wt 132.4 lb

## 2021-04-30 DIAGNOSIS — R030 Elevated blood-pressure reading, without diagnosis of hypertension: Secondary | ICD-10-CM | POA: Diagnosis not present

## 2021-04-30 DIAGNOSIS — F419 Anxiety disorder, unspecified: Secondary | ICD-10-CM

## 2021-04-30 MED ORDER — BUSPIRONE HCL 7.5 MG PO TABS: 7.5000 mg | ORAL_TABLET | Freq: Two times a day (BID) | ORAL | 1 refills | Status: DC

## 2021-04-30 MED ORDER — CITALOPRAM HYDROBROMIDE 20 MG PO TABS: 20.0000 mg | ORAL_TABLET | Freq: Every day | ORAL | 1 refills | Status: DC

## 2021-04-30 NOTE — Assessment & Plan Note (Signed)
Well controlled today ?No HTN ?Continue DASH diet and exercise ?Continue to monitor home BPs ?

## 2021-04-30 NOTE — Assessment & Plan Note (Signed)
Well controlled ?Continue buspar at current dose ?Decrease celexa to 20mg  daily ?

## 2021-04-30 NOTE — Progress Notes (Signed)
?  ? ?I,Sulibeya S Dimas,acting as a scribe for Lavon Paganini, MD.,have documented all relevant documentation on the behalf of Lavon Paganini, MD,as directed by  Lavon Paganini, MD while in the presence of Lavon Paganini, MD. ? ? ?Established patient visit ? ? ?Patient: Amber Hayes   DOB: 06-08-1955   66 y.o. Female  MRN: [id number] ?Visit Date: 04/30/2021 ? ?Today's healthcare provider: Lavon Paganini, MD  ? ?Chief Complaint  ?Patient presents with  ? Hypertension  ? ?Subjective  ?  ?Hypertension, follow-up ? ?BP Readings from Last 3 Encounters:  ?04/30/21 122/86  ?01/14/21 (!) 142/81  ?01/07/21 (!) 159/90  ? Wt Readings from Last 3 Encounters:  ?04/30/21 132 lb 6.4 oz (60.1 kg)  ?01/07/21 130 lb 14.4 oz (59.4 kg)  ?12/25/19 129 lb 12.8 oz (58.9 kg)  ?  ? ?She was last seen for hypertension 3 months ago.  ?BP at that visit was 142/81. Management since that visit includes no changes. ? ?No oral medication for hypertension ?She is following a Low Sodium diet. ?She is exercising. ?She does not smoke. ? ?Use of agents associated with hypertension: none.  ? ?Outside blood pressures are 135-140/70-80s. ?Symptoms: ?No chest pain No chest pressure  ?No palpitations No syncope  ?No dyspnea No orthopnea  ?No paroxysmal nocturnal dyspnea No lower extremity edema  ? ?Pertinent labs ?Lab Results  ?Component Value Date  ? CHOL 211 (H) 01/14/2021  ? HDL 71 01/14/2021  ? LDLCALC 129 (H) 01/14/2021  ? TRIG 62 01/14/2021  ? CHOLHDL 3.0 01/14/2021  ? Lab Results  ?Component Value Date  ? NA 143 01/14/2021  ? K 4.6 01/14/2021  ? CREATININE 0.82 01/14/2021  ? EGFR 78 01/14/2021  ? GLUCOSE 83 01/14/2021  ? TSH 2.190 07/05/2016  ?  ? ?The 10-year ASCVD risk score (Arnett DK, et al., 2019) is: 5.9% ? ?--------------------------------------------------------------------------------------------------- ?Anxiety, Follow-up ? ?She was last seen for anxiety 3 months ago. ?Changes made at last visit include no changes. ?  ?She  reports excellent compliance with treatment. Patient reports taking citalopram 34m daily. Patient would like to decrease citalopram to 258mdaily.  ?She reports excellent tolerance of treatment. ?She is not having side effects.  ? ?She feels her anxiety is mild and Improved since last visit. ? ?Symptoms: ?No chest pain No difficulty concentrating  ?No dizziness No fatigue  ?No feelings of losing control Yes insomnia  ?Yes irritable No palpitations  ?Yes panic attacks No racing thoughts  ?No shortness of breath No sweating  ?No tremors/shakes   ? ?GAD-7 Results ? ?  04/30/2021  ? 11:12 AM 12/25/2019  ?  4:09 PM 08/09/2019  ? 10:08 AM  ?GAD-7 Generalized Anxiety Disorder Screening Tool  ?1. Feeling Nervous, Anxious, or on Edge 1 0 3  ?2. Not Being Able to Stop or Control Worrying 0 0 2  ?3. Worrying Too Much About Different Things 0 0 2  ?4. Trouble Relaxing _0 ?5. Being So Restless it's Hard To Sit Still 0 0 3  ?6. Becoming Easily Annoyed or Irritable _1 ?7. Feeling Afraid As If Something Awful Might Happen 0 0 2  ?Total GAD-7 Score _2 ?Difficulty At Work, Home, or Getting  Along With Others? Not difficult at all Not difficult at all Somewhat difficult  ? ? ?PHQ-9 Scores ? ?  04/30/2021  ? 11:09 AM 01/07/2021  ? 11:07 AM 12/25/2019  ?  4:15 PM  ?PHQ9 SCORE ONLY  ?PHQ-9  Total Score _0 ? ? ?--------------------------------------------------------------------------------------------------- ? ? ?Medications: ?Outpatient Medications Prior to Visit  ?Medication Sig  ? [DISCONTINUED] busPIRone (BUSPAR) 7.5 MG tablet Take 1 tablet by mouth twice daily  ? [DISCONTINUED] citalopram (CELEXA) 20 MG tablet Take 1 tablet by mouth once daily (Patient taking differently: Patient taking 2m daily)  ? [DISCONTINUED] nirmatrelvir & ritonavir (PAXLOVID, 300/100,) 20 x 150 MG & 10 x 100MG TBPK Take 3 tablets by mouth twice a day for 5 days  ? ?No facility-administered medications prior to visit.  ? ? ?Review of Systems   ?Constitutional:  Negative for appetite change and fatigue.  ?Respiratory:  Negative for chest tightness and shortness of breath.   ?Cardiovascular:  Negative for chest pain, palpitations and leg swelling.  ? ?  ?  Objective  ?  ?BP 122/86 (BP Location: Left Arm, Patient Position: Sitting, Cuff Size: Large)   Pulse 71   Temp 97.6 ?F (36.4 ?C) (Temporal)   Resp 16   Wt 132 lb 6.4 oz (60.1 kg)   SpO2 99%   BMI 20.13 kg/m?  ?BP Readings from Last 3 Encounters:  ?04/30/21 122/86  ?01/14/21 (!) 142/81  ?01/07/21 (!) 159/90  ? ?Wt Readings from Last 3 Encounters:  ?04/30/21 132 lb 6.4 oz (60.1 kg)  ?01/07/21 130 lb 14.4 oz (59.4 kg)  ?12/25/19 129 lb 12.8 oz (58.9 kg)  ? ?  ?Physical Exam ?Vitals reviewed.  ?Constitutional:   ?   General: She is not in acute distress. ?   Appearance: Normal appearance. She is well-developed. She is not diaphoretic.  ?HENT:  ?   Head: Normocephalic and atraumatic.  ?Eyes:  ?   General: No scleral icterus. ?   Conjunctiva/sclera: Conjunctivae normal.  ?Neck:  ?   Thyroid: No thyromegaly.  ?Cardiovascular:  ?   Rate and Rhythm: Normal rate and regular rhythm.  ?   Heart sounds: Normal heart sounds. No murmur heard. ?Pulmonary:  ?   Effort: Pulmonary effort is normal. No respiratory distress.  ?   Breath sounds: Normal breath sounds. No wheezing, rhonchi or rales.  ?Musculoskeletal:  ?   Cervical back: Neck supple.  ?   Right lower leg: No edema.  ?   Left lower leg: No edema.  ?Lymphadenopathy:  ?   Cervical: No cervical adenopathy.  ?Skin: ?   General: Skin is warm and dry.  ?Neurological:  ?   Mental Status: She is alert and oriented to person, place, and time. Mental status is at baseline.  ?Psychiatric:     ?   Mood and Affect: Mood normal.     ?   Behavior: Behavior normal.  ?  ? ? ?No results found for any visits on 04/30/21. ? Assessment & Plan  ?  ? ?Problem List Items Addressed This Visit   ? ?  ? Other  ? Anxiety  ?  Well controlled ?Continue buspar at current dose ?Decrease  celexa to 269mdaily ?  ?  ? Relevant Medications  ? busPIRone (BUSPAR) 7.5 MG tablet  ? citalopram (CELEXA) 20 MG tablet  ? Blood pressure elevated without history of HTN - Primary  ?  Well controlled today ?No HTN ?Continue DASH diet and exercise ?Continue to monitor home BPs ?  ?  ?  ? ?Return in about 8 months (around 12/30/2021) for AWV, CPE after 12/15.  ?   ? ?I, AnLavon PaganiniMD, have reviewed all documentation for this visit. The documentation on 04/30/21 for  the exam, diagnosis, procedures, and orders are all accurate and complete. ? ? ?Virginia Crews, MD, MPH ?[place] ?[facility]   ?

## 2021-05-07 ENCOUNTER — Ambulatory Visit: Payer: Medicare Other | Admitting: Physical Therapy

## 2021-05-07 DIAGNOSIS — R279 Unspecified lack of coordination: Secondary | ICD-10-CM

## 2021-05-07 DIAGNOSIS — S0501XA Injury of conjunctiva and corneal abrasion without foreign body, right eye, initial encounter: Secondary | ICD-10-CM | POA: Diagnosis not present

## 2021-05-07 DIAGNOSIS — M545 Low back pain, unspecified: Secondary | ICD-10-CM

## 2021-05-07 DIAGNOSIS — M533 Sacrococcygeal disorders, not elsewhere classified: Secondary | ICD-10-CM

## 2021-05-07 DIAGNOSIS — M542 Cervicalgia: Secondary | ICD-10-CM

## 2021-05-07 DIAGNOSIS — R2689 Other abnormalities of gait and mobility: Secondary | ICD-10-CM

## 2021-05-07 IMAGING — MG MM  DIGITAL SCREENING BREAST BILAT IMPLANT W/ TOMO W/ CAD
8 of 16 series · 8 of 32 positions shown · non-contrast
Comparison: Previous exam(s).

CLINICAL DATA: Screening.

EXAM:
DIGITAL SCREENING BILATERAL MAMMOGRAM WITH IMPLANTS, CAD AND TOMO
The patient has retropectoral implants. Standard and implant
displaced views were performed.

[L CC (1 of 2)]
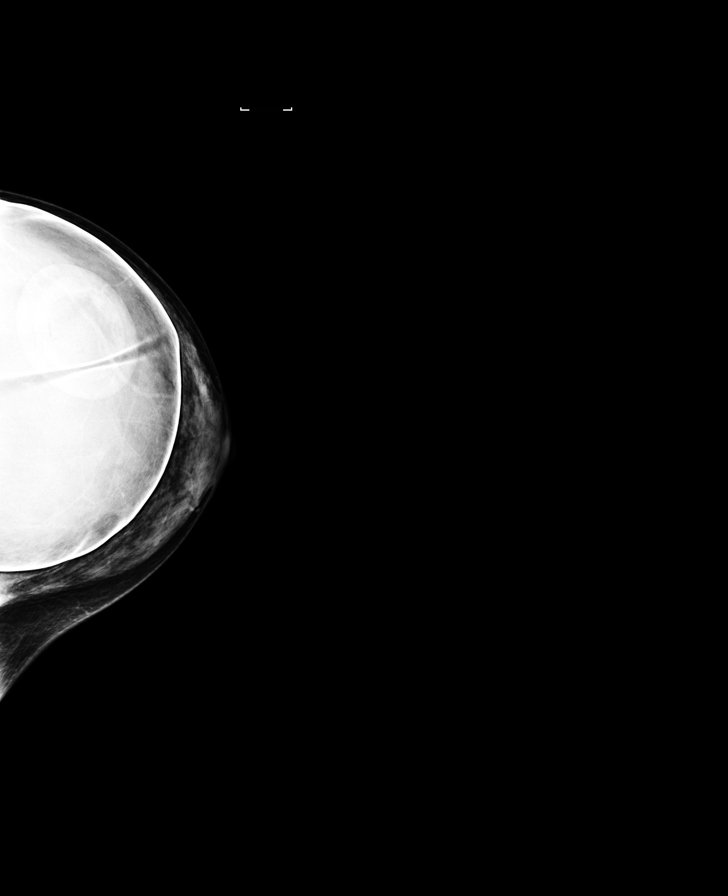

[R MLO]
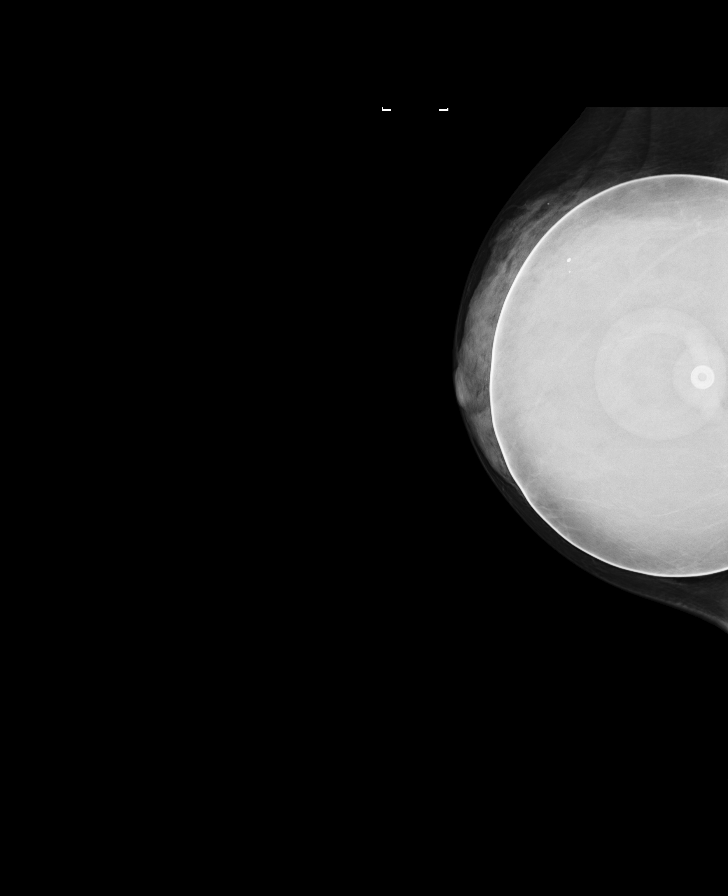

[L MLO (1 of 2)]
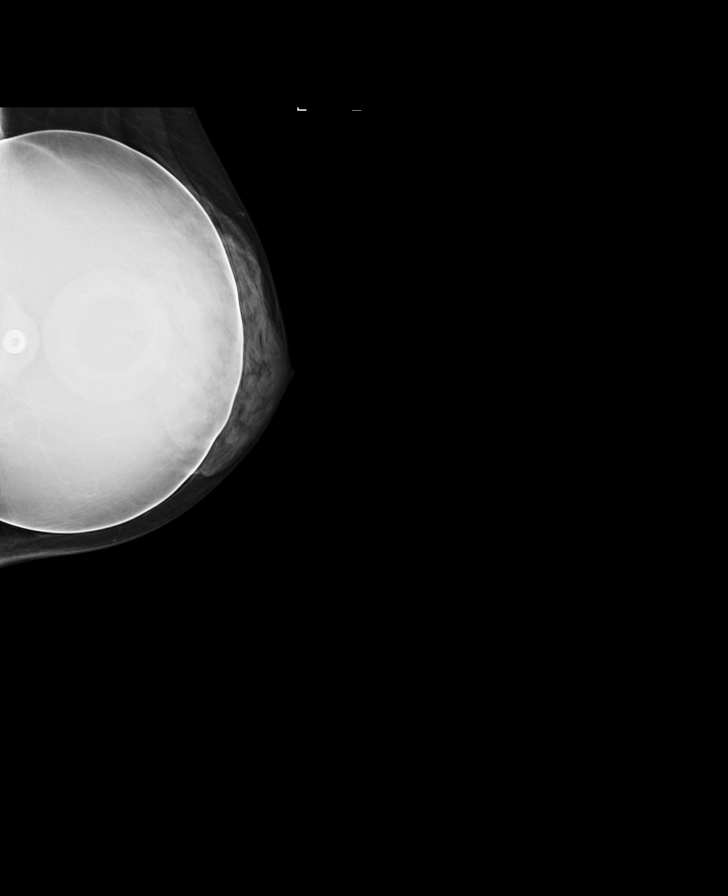

[R CC (1 of 2)]
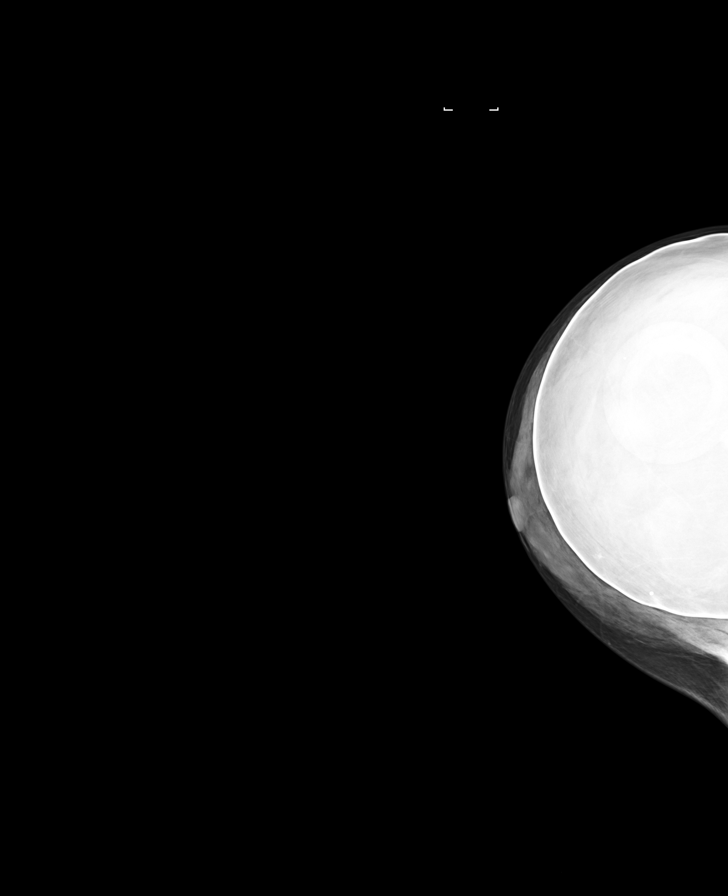

[L CC (2 of 2)]
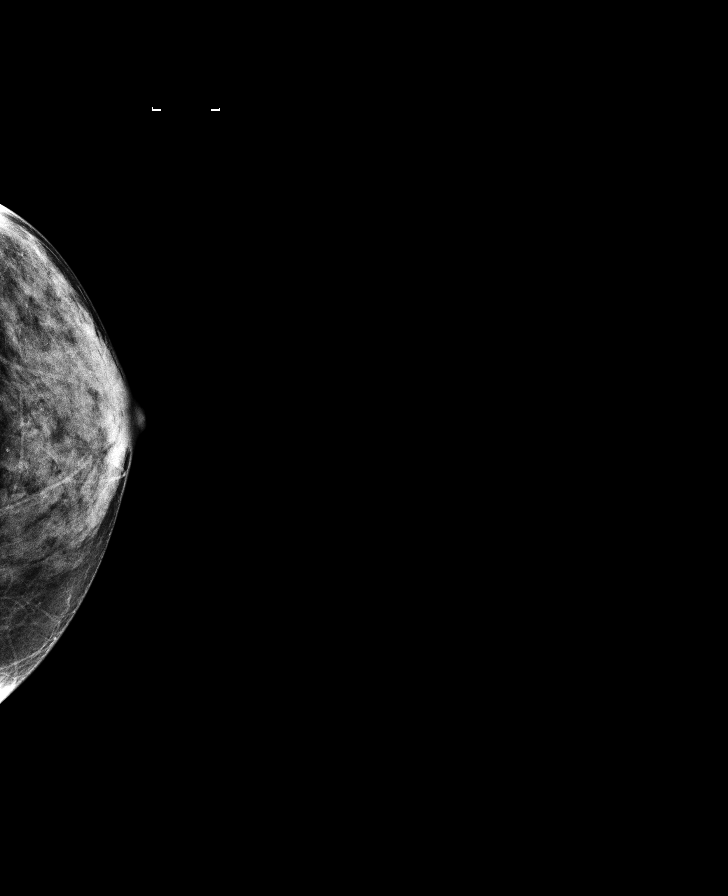

[R CC (2 of 2)]
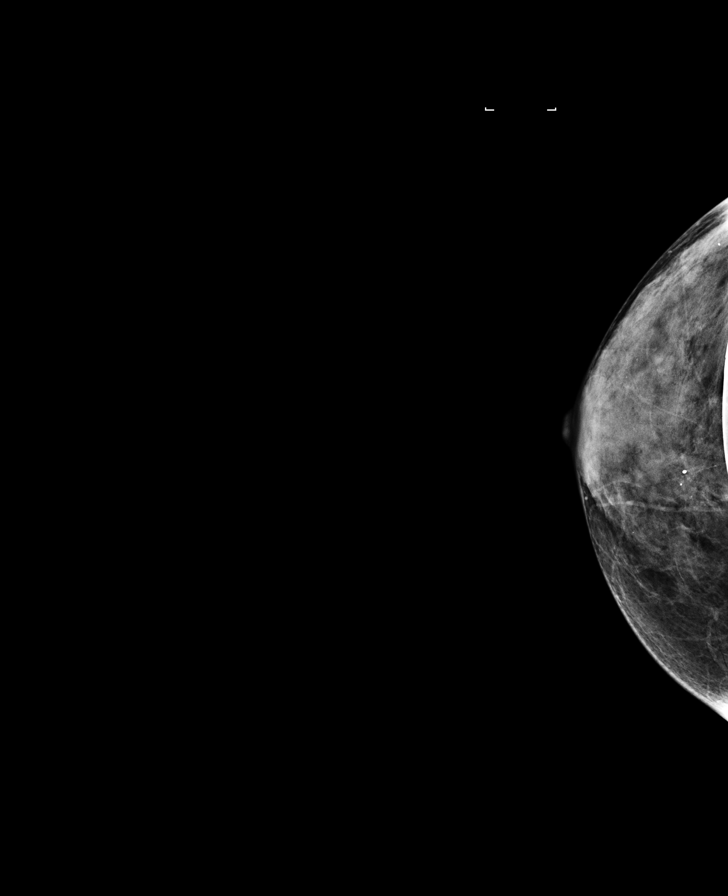

[L MLO (2 of 2)]
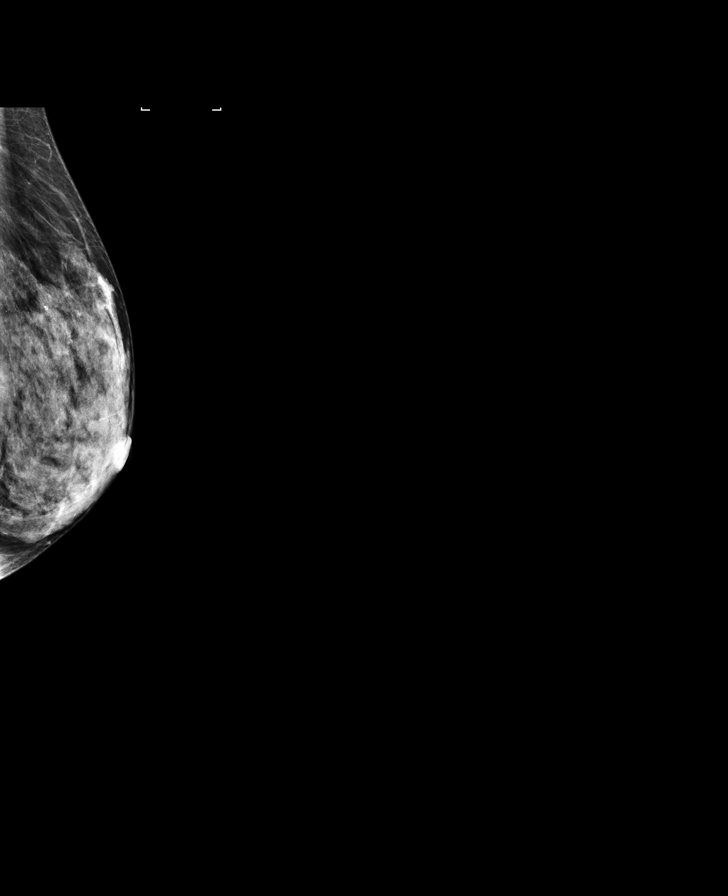

[L MLO synth-2D]
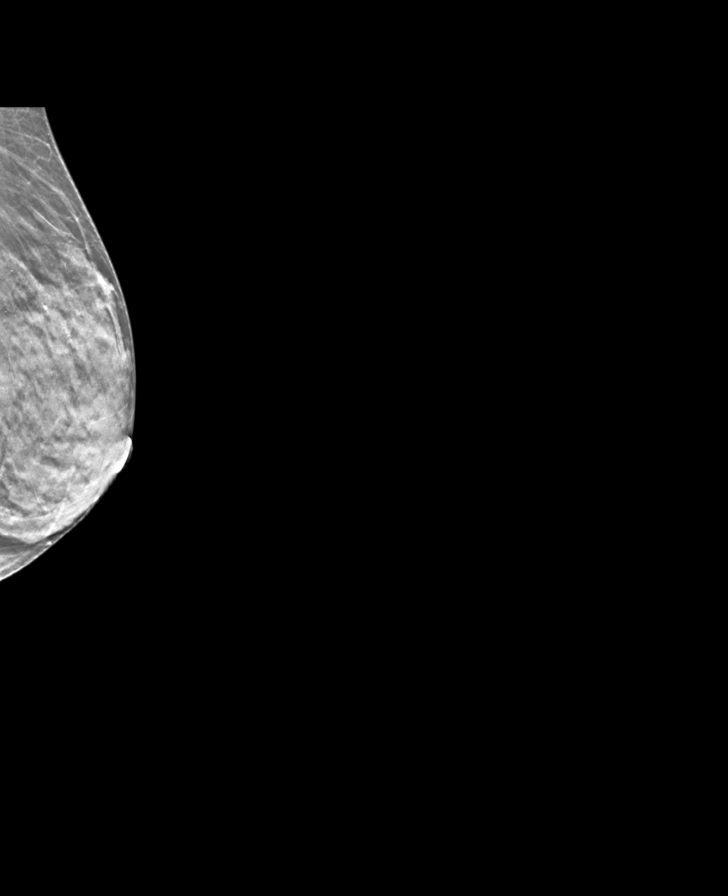

[8 of 32 positions shown; findings below may reference images not displayed]

ACR Breast Density Category d: The breast tissue is extremely dense,
which lowers the sensitivity of mammography.
FINDINGS: There are no findings suspicious for malignancy. Bilateral
retropectoral saline implants are noted. Images were processed with
CAD.
IMPRESSION: No mammographic evidence of malignancy. A result letter of this
screening mammogram will be mailed directly to the patient.

RECOMMENDATION:
Screening mammogram in one year. (Code:GP-T-GNX)

BI-RADS CATEGORY  1:  Negative.

## 2021-05-07 NOTE — Therapy (Signed)
[place] ?[facility] ?[address] ?[place], [place], [address] ?Phone: [phone]   Fax:  [phone] ? ?Physical Therapy Treatment ? ?Patient Details  ?Name: Amber Hayes ?MRN: [id number] ?Date of Birth: [birth year] ?No data recorded ? ?Encounter Date: 05/07/2021 ? ? PT End of Session - 05/07/21 0916   ? ? Visit Number 3   ? Number of Visits 10   ? Date for PT Re-Evaluation 06/08/21   ? PT Start Time [phone]   ? PT Stop Time 0906   ? PT Time Calculation (min) 59 min   ? Activity Tolerance Patient tolerated treatment well   ? Behavior During Therapy [facility] for tasks assessed/performed   ? ?  ?  ? ?  ? ? ?Past Medical History:  ?Diagnosis Date  ? Osteoporosis   ? ? ?Past Surgical History:  ?Procedure Laterality Date  ? AUGMENTATION MAMMAPLASTY Bilateral 2003  ? CATARACT EXTRACTION W/PHACO Right 02/13/2019  ? Procedure: CATARACT EXTRACTION PHACO AND INTRAOCULAR LENS PLACEMENT (IOC) RIGHT 3.81 00:38.5 9.9%;  Surgeon: Lockie Mola, MD;  Location: [facility];  Service: Ophthalmology;  Laterality: Right;  ? CATARACT EXTRACTION W/PHACO Left 04/10/2019  ? Procedure: CATARACT EXTRACTION PHACO AND INTRAOCULAR LENS PLACEMENT (IOC) LEFT 2.02 00:27.3 7.4%;  Surgeon: Lockie Mola, MD;  Location: [facility];  Service: Ophthalmology;  Laterality: Left;  ? COLONOSCOPY WITH PROPOFOL N/A 10/19/2018  ? Procedure: COLONOSCOPY WITH PROPOFOL;  Surgeon: Wyline Mood, MD;  Location: [facility];  Service: Gastroenterology;  Laterality: N/A;  ? EYE SURGERY  1999  ? Lasik  ? EYE SURGERY  2017  ? Torn Retina  ? ? ?There were no vitals filed for this visit. ? ? Subjective Assessment - 05/07/21 0956   ? ? Subjective Pt noticed she was able to lie on her back longer for sleeping after last session.  Pt notice less pelvic pressure.   ? ?  ?  ? ?  ? ? ? ? ? [facility] PT Assessment - 05/07/21 0917   ? ?  ? Posture/Postural Control  ? Posture Comments increased lordosis at T/L  junction, poor co-activation of depe core in standing posture   ?  ? Palpation  ? Spinal mobility supine: L 12th rib rotate more anterior, rib 10-12 limited space /limited expansion on inhalation ( improved post Tx)   ? SI assessment  levelled shoulders and pelvic girdle   ? Palpation comment tightness along intercostals, interspinals at thoracic spine, intercostal tightness along T 7-10 anterior/lateral/ posterior on L, rhombodis, hypomobile T7-10 slight deviation to L, teres minor/ major, levator scapula   ? ?  ?  ? ?  ? ? ? ? ? ? ? ? ? ? ? ? ? ? ? ? [facility] Adult PT Treatment/Exercise - 05/07/21 0917   ? ?  ? Neuro Re-ed   ? Neuro Re-ed Details  cued for alignmenta nd technique in past HEP and filmed on pt's phone for compliance, cued for cervicoscapular mobility HEP   ?  ? Modalities  ? Modalities Moist Heat   ?  ? Moist Heat Therapy  ? Number Minutes Moist Heat 5 Minutes   ? Moist Heat Location --   thoracic, during review of HEP  ?  ? Manual Therapy  ? Manual therapy comments STM/MWM at problem areas noted in assessment to promote intercostal expansion, cervicoscapular alignment and mobility on L thorax   ? ?  ?  ? ?  ? ? ? ? ? ? ? ? ? ? ? ? ? ? ?  PT Long Term Goals - 05/07/21 0958   ? ?  ? PT LONG TERM GOAL #1  ? Title Pt wil report she emptied all the way with urination and bowel movements across 75% of the time in order in order to practice proper hygiene   ? Time 8   ? Period Weeks   ? Status New   ? Target Date 05/25/21   ?  ? PT LONG TERM GOAL #2  ? Title Pt will demo proper deep core coordination without cues to correct dyscoordination of deep core   ? Time 4   ? Period Weeks   ? Status New   ? Target Date 04/27/21   ?  ? PT LONG TERM GOAL #3  ? Title Pt will demo proper body mechanics with against gravity tasks ( dog care, lifting dog food bags, hiking, biking hills) and fitness routine to minimize ab/ pelvic straining   ? Time 8   ? Period Weeks   ? Status New   ? Target Date 05/25/21   ?  ? PT LONG TERM  GOAL #4  ? Title sit in a chair comfortably, read comfortably, wake up without neck pain/ decreased HA by 50%  in order to enjoy her hobby   ? Baseline pain in neck , 9/10   ? Time 8   ? Period Weeks   ? Status New   ? Target Date 05/25/21   ?  ? PT LONG TERM GOAL #5  ? Title Pt will report being able to stand and cook for 1  hour with < 4/10 LBP   ? Baseline 7/10   ? Time 4   ? Period Weeks   ? Status New   ? Target Date 04/27/21   ?  ? PT LONG TERM GOAL #6  ? Title Pt will report decreased Numbness in B under all toes top and bottom occurs when sitting across 75% of the time in order to promote better circulation   ? Time 10   ? Period Weeks   ? Status New   ? Target Date 06/08/21   ?  ? PT LONG TERM GOAL #7  ? Title Pt will demo increased strength on RLE from 3/5 to > 4/5 and L knee flexion from 3/5 to > 4/5 in order to walk with less risk for falls.   ? Time 4   ? Period Weeks   ? Status New   ? Target Date 04/27/21   ?  ? PT LONG TERM GOAL #8  ? Title Pt will demo > 2  pt change for FOTO score Lumbar ( 83 pt to < 81 pts ) and PFDI Prolapse 4 pts to <2 pts) in order to improve QOL and ADLs   ? Time 10   ? Period Weeks   ? Status New   ? Target Date 06/08/21   ? ?  ?  ? ?  ? ? ? ? ? ? ? ? Plan - 05/07/21 0917   ? ? Clinical Impression Statement Pt is making progress with report of being able to tolerate sleeping on her back more since last session. Pt demo'd good carry over with levelled shoulder and pelvic girdle alignment but required manual Tx to increase lateral and anterior excursion of L thorax / ribcage and decrease tensions along L thoracic spine in order to improve cervical spine alignment from forward head posture and also to optimize diaphragmatic excursion for pelvic function. Pt also  required cues for standing posture to minimize excessive lumbar lordosis. Anticipate pt will progress towards goals with more skilled PT. Plan to add cervicothoracic stretches and strengthening at next session and  progress to deep core coordination.  ? Personal Factors and Comorbidities Fitness   ? Examination-Activity Limitations Locomotion Level;Stand;Other   ? Stability/Clinical Decision Making Evolving/Moderate complexity   ? Rehab Potential Good   ? PT Frequency 1x / week   ? PT Duration Other (comment)   10  ? PT Treatment/Interventions Neuromuscular re-education;Functional mobility training;Stair training;Gait training;Scar mobilization;Manual techniques;Patient/family education;Therapeutic activities;Therapeutic exercise;Splinting;Taping;Joint Manipulations;Spinal Manipulations;Dry needling;Energy conservation;Moist Heat;Traction;Cryotherapy;Canalith Repostioning;ADLs/Self Care Home Management;Balance training   ? Consulted and Agree with Plan of Care Patient   ? ?  ?  ? ?  ? ? ?Patient will benefit from skilled therapeutic intervention in order to improve the following deficits and impairments:  Decreased strength, Decreased range of motion, Decreased endurance, Decreased activity tolerance, Decreased balance, Impaired flexibility, Decreased coordination, Abnormal gait, Hypermobility, Improper body mechanics, Pain, Increased muscle spasms, Postural dysfunction, Decreased scar mobility, Decreased mobility, Hypomobility, Difficulty walking ? ?Visit Diagnosis: ?Other abnormalities of gait and mobility ? ?Sacrococcygeal disorders, not elsewhere classified ? ?Unspecified lack of coordination ? ?Lumbar pain ? ?Neck pain ? ? ? ? ?Problem List ?Patient Active Problem List  ? Diagnosis Date Noted  ? Neuropathy 01/14/2021  ? Pelvic pressure in female 01/14/2021  ? Abdominal pain 12/05/2019  ? Neck pain 10/25/2019  ? Chronic constipation 10/25/2019  ? Age-related osteoporosis without current pathological fracture 12/10/2018  ? Panic attack 06/23/2017  ? Cystocele without uterine prolapse 12/29/2016  ? Vaginal atrophy 11/18/2016  ? Allergic rhinitis 05/14/2015  ? Blood pressure elevated without history of HTN 05/14/2015  ?  Decreased libido 05/14/2015  ? Abnormal bruising 05/14/2015  ? Pure hypercholesterolemia 05/14/2015  ? Anxiety 02/16/2015  ? Atypical chest pain 07/10/2009  ? Alcohol use 12/05/2007  ? ? ?Mariane Masters, PT ?4/14

## 2021-05-07 NOTE — Patient Instructions (Signed)
To lengthen neck muscles ? ?And improve midback spine  ? ? ?-- ?  ?Open book on both sides  ?Pillow under head ?With pillow between knees and behind back ?10 reps  ? ?Angel wings with small towel rolled under neck ?Dragging arms on floor inhale, up, exhale down and notice shoulders lower from ears ?10 reps  ?

## 2021-05-10 ENCOUNTER — Ambulatory Visit: Payer: Medicare Other | Admitting: Physical Therapy

## 2021-05-17 ENCOUNTER — Ambulatory Visit: Payer: Medicare Other | Admitting: Physical Therapy

## 2021-05-17 DIAGNOSIS — M545 Low back pain, unspecified: Secondary | ICD-10-CM | POA: Diagnosis not present

## 2021-05-17 DIAGNOSIS — M533 Sacrococcygeal disorders, not elsewhere classified: Secondary | ICD-10-CM

## 2021-05-17 DIAGNOSIS — R2689 Other abnormalities of gait and mobility: Secondary | ICD-10-CM

## 2021-05-17 DIAGNOSIS — M542 Cervicalgia: Secondary | ICD-10-CM

## 2021-05-17 DIAGNOSIS — R279 Unspecified lack of coordination: Secondary | ICD-10-CM | POA: Diagnosis not present

## 2021-05-17 NOTE — Therapy (Signed)
[place] ?[facility] ?[address] ?[place], [place], [address] ?Phone: [phone]   Fax:  [phone] ? ?Physical Therapy Treatment ? ?Patient Details  ?Name: Amber BeechamCynthia Hayes ?MRN: [id number] ?Date of Birth: 05/25/1955 ?No data recorded ? ?Encounter Date: 05/17/2021 ? ? PT End of Session - 05/17/21 1258   ? ? Visit Number 4   ? Number of Visits 10   ? Date for PT Re-Evaluation 06/08/21   ? PT Start Time 0800   ? PT Stop Time 0900   ? PT Time Calculation (min) 60 min   ? ?  ?  ? ?  ? ? ?Past Medical History:  ?Diagnosis Date  ? Osteoporosis   ? ? ?Past Surgical History:  ?Procedure Laterality Date  ? AUGMENTATION MAMMAPLASTY Bilateral 2003  ? CATARACT EXTRACTION W/PHACO Right 02/13/2019  ? Procedure: CATARACT EXTRACTION PHACO AND INTRAOCULAR LENS PLACEMENT (IOC) RIGHT 3.81 00:38.5 9.9%;  Surgeon: Lockie MolaBrasington, Chadwick, MD;  [facility];  Service: Ophthalmology;  Laterality: Right;  ? CATARACT EXTRACTION W/PHACO Left 04/10/2019  ? Procedure: CATARACT EXTRACTION PHACO AND INTRAOCULAR LENS PLACEMENT (IOC) LEFT 2.02 00:27.3 7.4%;  Surgeon: Lockie MolaBrasington, Chadwick, MD;  [facility];  Service: Ophthalmology;  Laterality: Left;  ? COLONOSCOPY WITH PROPOFOL N/A 10/19/2018  ? Procedure: COLONOSCOPY WITH PROPOFOL;  Surgeon: Wyline MoodAnna, Kiran, MD;  Location[facility];  Service: Gastroenterology;  Laterality: N/A;  ? EYE SURGERY  1999  ? Lasik  ? EYE SURGERY  2017  ? Torn Retina  ? ? ?There were no vitals filed for this visit. ? ? Subjective Assessment - 05/17/21 0807   ? ? Subjective Pt noticed when she was doing her angel wing and noticed the area was tight. The angel wing exericse was fine. Pt had a massage therapy session several days later and it was rushed. Pt had headaches 3 days in a row afterwards and she thinks it may be related to the massage.   ? 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   ?  Patient Stated Goals sit in a chair comfortable, read comfortably without neck   ? ?  ?  ? ?  ? ? ? ? ? [facility] PT Assessment - 05/17/21 1310   ? ?  ? Coordination  ? Coordination and Movement Description chest breathing, limited diaphragmatic excursion   ?  ? Posture/Postural Control  ? Posture Comments hyperextended knees, posterior COM / shoulders back in standing,   ?  ? Palpation  ? Spinal mobility hypomobile at C/T junction, T 3  , interspinals B , upper trap L > R   ? ?  ?  ? ?  ? ? ? ? ? ? ? ? ? ? ? ? ? ? ? ? [facility] Adult PT Treatment/Exercise - 05/17/21 1310   ? ?  ? Neuro Re-ed   ? Neuro Re-ed Details  cued for scapular retraction/depression, cued for standing posture with co-activaiton of deep core and COM, feet ,   ?  ? Manual Therapy  ? Manual therapy comments STM/MWM at problem areas noted in assessment to promote intercostal expansion and mobility at C/T junction,   ? ?  ?  ? ?  ? ? ? ? ? ? ? ? ? ? ? ? ? ? ? PT Long Term Goals - 05/07/21 0958   ? ?  ? PT LONG TERM GOAL #1  ? Title Pt wil report she emptied  all the way with urination and bowel movements across 75% of the time in order in order to practice proper hygiene   ? Time 8   ? Period Weeks   ? Status New   ? Target Date 05/25/21   ?  ? PT LONG TERM GOAL #2  ? Title Pt will demo proper deep core coordination without cues to correct dyscoordination of deep core   ? Time 4   ? Period Weeks   ? Status New   ? Target Date 04/27/21   ?  ? PT LONG TERM GOAL #3  ? Title Pt will demo proper body mechanics with against gravity tasks ( dog care, lifting dog food bags, hiking, biking hills) and fitness routine to minimize ab/ pelvic straining   ? Time 8   ? Period Weeks   ? Status New   ? Target Date 05/25/21   ?  ? PT LONG TERM GOAL #4  ? Title sit in a chair comfortably, read comfortably, wake up without neck pain/ decreased HA by 50%  in order to enjoy her hobby   ? Baseline pain in neck , 9/10   ? Time 8   ? Period Weeks   ? Status New   ? Target Date  05/25/21   ?  ? PT LONG TERM GOAL #5  ? Title Pt will report being able to stand and cook for 1  hour with < 4/10 LBP   ? Baseline 7/10   ? Time 4   ? Period Weeks   ? Status New   ? Target Date 04/27/21   ?  ? PT LONG TERM GOAL #6  ? Title Pt will report decreased Numbness in B under all toes top and bottom occurs when sitting across 75% of the time in order to promote better circulation   ? Time 10   ? Period Weeks   ? Status New   ? Target Date 06/08/21   ?  ? PT LONG TERM GOAL #7  ? Title Pt will demo increased strength on RLE from 3/5 to > 4/5 and L knee flexion from 3/5 to > 4/5 in order to walk with less risk for falls.   ? Time 4   ? Period Weeks   ? Status New   ? Target Date 04/27/21   ?  ? PT LONG TERM GOAL #8  ? Title Pt will demo > 2  pt change for FOTO score Lumbar ( 83 pt to < 81 pts ) and PFDI Prolapse 4 pts to <2 pts) in order to improve QOL and ADLs   ? Time 10   ? Period Weeks   ? Status New   ? Target Date 06/08/21   ? ?  ?  ? ?  ? ? ? ? ? ? ? ? Plan - 05/17/21 0812   ? ? Clinical Impression Statement Pt required further manual Tx to mobilize thoracic and cervical segments to promote a more stacked posture. Pt required cues for deep core coordination to minimize dyscoordination of pelvic floor and promote co-activation of deep core in standing and sitting postures. Pt continues to benefit from skilled PT  ? Personal Factors and Comorbidities Fitness   ? Examination-Activity Limitations Locomotion Level;Stand;Other   ? Stability/Clinical Decision Making Evolving/Moderate complexity   ? Rehab Potential Good   ? PT Frequency 1x / week   ? PT Duration Other (comment)   10  ? PT Treatment/Interventions Neuromuscular re-education;Functional mobility  training;Stair training;Gait training;Scar mobilization;Manual techniques;Patient/family education;Therapeutic activities;Therapeutic exercise;Splinting;Taping;Joint Manipulations;Spinal Manipulations;Dry needling;Energy conservation;Moist  Heat;Traction;Cryotherapy;Canalith Repostioning;ADLs/Self Care Home Management;Balance training   ? Consulted and Agree with Plan of Care Patient   ? ?  ?  ? ?  ? ? ?Patient will benefit from skilled therapeutic intervention in order to improve the following deficits and impairments:  Decreased strength, Decreased range of motion, Decreased endurance, Decreased activity tolerance, Decreased balance, Impaired flexibility, Decreased coordination, Abnormal gait, Hypermobility, Improper body mechanics, Pain, Increased muscle spasms, Postural dysfunction, Decreased scar mobility, Decreased mobility, Hypomobility, Difficulty walking ? ?Visit Diagnosis: ?Sacrococcygeal disorders, not elsewhere classified ? ?Other abnormalities of gait and mobility ? ?Unspecified lack of coordination ? ?Lumbar pain ? ?Neck pain ? ? ? ? ?Problem List ?Patient Active Problem List  ? Diagnosis Date Noted  ? Neuropathy 01/14/2021  ? Pelvic pressure in female 01/14/2021  ? Abdominal pain 12/05/2019  ? Neck pain 10/25/2019  ? Chronic constipation 10/25/2019  ? Age-related osteoporosis without current pathological fracture 12/10/2018  ? Panic attack 06/23/2017  ? Cystocele without uterine prolapse 12/29/2016  ? Vaginal atrophy 11/18/2016  ? Allergic rhinitis 05/14/2015  ? Blood pressure elevated without history of HTN 05/14/2015  ? Decreased libido 05/14/2015  ? Abnormal bruising 05/14/2015  ? Pure hypercholesterolemia 05/14/2015  ? Anxiety 02/16/2015  ? Atypical chest pain 07/10/2009  ? Alcohol use 12/05/2007  ? ? ?Amber Hayes, PT ?05/17/2021, 1:59 PM ? ?[place] ?[facility] ?[address] ?[place], [place], [address] ?Phone: [phone]   Fax:  [phone] ? ?Name: Amber Hayes ?MRN: [id number] ?Date of Birth: 1955-10-02 ? ? ? ?

## 2021-05-17 NOTE — Patient Instructions (Signed)
? ?  Neck / shoulder stretches: ? ?  ?Lying on back - small sushi roll towel under neck  ?_ 6 directions  ? ?5 reps  ? ? ?_angel wings, lower elbows down , keep arms touching bed , bring elbow up to shoulder level to not activate upper trap  ?10 reps  ? ?

## 2021-05-24 ENCOUNTER — Encounter: Payer: Medicare Other | Admitting: Physical Therapy

## 2021-05-26 ENCOUNTER — Ambulatory Visit: Payer: Medicare Other | Attending: Family Medicine | Admitting: Physical Therapy

## 2021-05-26 DIAGNOSIS — M533 Sacrococcygeal disorders, not elsewhere classified: Secondary | ICD-10-CM | POA: Diagnosis not present

## 2021-05-26 DIAGNOSIS — M542 Cervicalgia: Secondary | ICD-10-CM | POA: Insufficient documentation

## 2021-05-26 DIAGNOSIS — M545 Low back pain, unspecified: Secondary | ICD-10-CM | POA: Insufficient documentation

## 2021-05-26 DIAGNOSIS — G629 Polyneuropathy, unspecified: Secondary | ICD-10-CM | POA: Diagnosis not present

## 2021-05-26 DIAGNOSIS — R279 Unspecified lack of coordination: Secondary | ICD-10-CM | POA: Diagnosis not present

## 2021-05-26 DIAGNOSIS — R2689 Other abnormalities of gait and mobility: Secondary | ICD-10-CM | POA: Diagnosis not present

## 2021-05-26 NOTE — Patient Instructions (Signed)
Multifidis twist  ?Band is on doorknob:sit in chair (facing perpendicular to door  ? ?Twisting trunk without moving the hips and knees ?Hold band at the level of ribcage, elbows bent,shoulder blades roll back and down like squeezing a pencil under armpit ?  ?Exhale twist,.10-15 deg away from door without moving your hips/ knees, press more weight on the side of the sitting bones/ foot opp of your direction of turn as your counterweight. Continue to maintain equal weight through legs.  Keep knee unlocked.  ?30 reps ? ?

## 2021-05-26 NOTE — Therapy (Signed)
[place] ?[facility] ?[address] ?[place], [place], [address] ?Phone: [phone]   Fax:  [phone] ? ?Physical Therapy Treatment ? ?Patient Details  ?Name: Amber Hayes ?MRN: [id number] ?Date of Birth: [birth year] ?No data recorded ? ?Encounter Date: 05/26/2021 ? ? PT End of Session - 05/26/21 1112   ? ? Visit Number 5   ? Number of Visits 10   ? Date for PT Re-Evaluation 06/08/21   ? PT Start Time 1107   ? PT Stop Time 1200   ? PT Time Calculation (min) 53 min   ? Activity Tolerance Patient tolerated treatment well   ? Behavior During Therapy [facility] for tasks assessed/performed   ? ?  ?  ? ?  ? ? ?Past Medical History:  ?Diagnosis Date  ? Osteoporosis   ? ? ?Past Surgical History:  ?Procedure Laterality Date  ? AUGMENTATION MAMMAPLASTY Bilateral 2003  ? CATARACT EXTRACTION W/PHACO Right 02/13/2019  ? Procedure: CATARACT EXTRACTION PHACO AND INTRAOCULAR LENS PLACEMENT (IOC) RIGHT 3.81 00:38.5 9.9%;  Surgeon: Lockie Mola, MD;  Location: [facility];  Service: Ophthalmology;  Laterality: Right;  ? CATARACT EXTRACTION W/PHACO Left 04/10/2019  ? Procedure: CATARACT EXTRACTION PHACO AND INTRAOCULAR LENS PLACEMENT (IOC) LEFT 2.02 00:27.3 7.4%;  Surgeon: Lockie Mola, MD;  Location: [facility];  Service: Ophthalmology;  Laterality: Left;  ? COLONOSCOPY WITH PROPOFOL N/A 10/19/2018  ? Procedure: COLONOSCOPY WITH PROPOFOL;  Surgeon: Wyline Mood, MD;  Location: [facility];  Service: Gastroenterology;  Laterality: N/A;  ? EYE SURGERY  1999  ? Lasik  ? EYE SURGERY  2017  ? Torn Retina  ? ? ?There were no vitals filed for this visit. ? ? Subjective Assessment - 05/26/21 1110   ? ? Subjective Pt reported the neck exercises really helped her. Pt woke up with a HA this morning but she did the neck exercises and it helped it to go away.  Neck pain is 80% better. LBP is better by 50% but the the groin and pubic pain is still present. Pt is able to sleep  on R hip without pain.   ? 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   ? Patient Stated Goals sit in a chair comfortable, read comfortably without neck   ? ?  ?  ? ?  ? ? ? ? ? [facility] PT Assessment - 05/26/21 1114   ? ?  ? Coordination  ? Coordination and Movement Description decreased firing of R multidifis in sequence with glut/ hamstring   ?  ? Strength  ? Overall Strength Comments posterior slight ( scaption opp hip ext: increased lumbar lordosis/ decreased multidifis strength on R),   ?  ? Palpation  ? Spinal mobility hypomobile at C/T junction, T 3 -5 , paraspinals, rhomboids   ? ?  ?  ? ?  ? ? ? ? ? ? ? ? ? ? ? ? ? ? ? ? [facility] Adult PT Treatment/Exercise - 05/26/21 1413   ? ?  ? Therapeutic Activites   ? Other Therapeutic Activities explained plan to intergrate into yoga classes and gym fitness routine with education on alignment and technique to minimize relapse of Sx   ?  ? Neuro Re-ed   ? Neuro Re-ed Details  cued for multidifis mms strengthening seated,  relaxation practice and neck/ scapular HEP   ?  ? Modalities  ? Modalities Moist Heat   ?  ? Moist  Heat Therapy  ? Number Minutes Moist Heat 5 Minutes   ? Moist Heat Location --   cervical/ scapular with guided relaxation  ?  ? Manual Therapy  ? Manual therapy comments STM/MWM at problem areas noted in assessment to promote less thoracic kyphosis and more scapular mobility   ? ?  ?  ? ?  ? ? ? ? ? ? ? ? ? ? ? ? ? ? ? PT Long Term Goals - 05/07/21 0958   ? ?  ? PT LONG TERM GOAL #1  ? Title Pt wil report she emptied all the way with urination and bowel movements across 75% of the time in order in order to practice proper hygiene   ? Time 8   ? Period Weeks   ? Status New   ? Target Date 05/25/21   ?  ? PT LONG TERM GOAL #2  ? Title Pt will demo proper deep core coordination without cues to correct dyscoordination of deep core   ? Time 4   ? Period Weeks   ? Status New   ? Target Date 04/27/21   ?  ? PT LONG TERM  GOAL #3  ? Title Pt will demo proper body mechanics with against gravity tasks ( dog care, lifting dog food bags, hiking, biking hills) and fitness routine to minimize ab/ pelvic straining   ? Time 8   ? Period Weeks   ? Status New   ? Target Date 05/25/21   ?  ? PT LONG TERM GOAL #4  ? Title sit in a chair comfortably, read comfortably, wake up without neck pain/ decreased HA by 50%  in order to enjoy her hobby   ? Baseline pain in neck , 9/10   ? Time 8   ? Period Weeks   ? Status New   ? Target Date 05/25/21   ?  ? PT LONG TERM GOAL #5  ? Title Pt will report being able to stand and cook for 1  hour with < 4/10 LBP   ? Baseline 7/10   ? Time 4   ? Period Weeks   ? Status New   ? Target Date 04/27/21   ?  ? PT LONG TERM GOAL #6  ? Title Pt will report decreased Numbness in B under all toes top and bottom occurs when sitting across 75% of the time in order to promote better circulation   ? Time 10   ? Period Weeks   ? Status New   ? Target Date 06/08/21   ?  ? PT LONG TERM GOAL #7  ? Title Pt will demo increased strength on RLE from 3/5 to > 4/5 and L knee flexion from 3/5 to > 4/5 in order to walk with less risk for falls.   ? Time 4   ? Period Weeks   ? Status New   ? Target Date 04/27/21   ?  ? PT LONG TERM GOAL #8  ? Title Pt will demo > 2  pt change for FOTO score Lumbar ( 83 pt to < 81 pts ) and PFDI Prolapse 4 pts to <2 pts) in order to improve QOL and ADLs   ? Time 10   ? Period Weeks   ? Status New   ? Target Date 06/08/21   ? ?  ?  ? ?  ? ? ? ? ? ? ? ? Plan - 05/26/21 1112   ? ? Clinical Impression  Statement Pt is making great improvements with report of neck pain is 80% better. LBP is better by 50% but the the groin and pubic pain is still present. Pt is able to sleep on R hip without pain.  ? ?Continued to apply manual Tx to minimzie thoracic kyphosis and lumbar lordosis. Pt demo'd improved scapular mobility post Tx. Progressed pt to multidifis strengthening in seated position with green band as pt  demo'd decreased firing of this posterior core mm. Explained progression towards fitness classes and yoga classes with education of customized alignment and technique to minimize relapse of Sx. Pt continues to benefit from skilled PT  ? Personal Factors and Comorbidities Fitness   ? Examination-Activity Limitations Locomotion Level;Stand;Other   ? Stability/Clinical Decision Making Evolving/Moderate complexity   ? Rehab Potential Good   ? PT Frequency 1x / week   ? PT Duration Other (comment)   10  ? PT Treatment/Interventions Neuromuscular re-education;Functional mobility training;Stair training;Gait training;Scar mobilization;Manual techniques;Patient/family education;Therapeutic activities;Therapeutic exercise;Splinting;Taping;Joint Manipulations;Spinal Manipulations;Dry needling;Energy conservation;Moist Heat;Traction;Cryotherapy;Canalith Repostioning;ADLs/Self Care Home Management;Balance training   ? Consulted and Agree with Plan of Care Patient   ? ?  ?  ? ?  ? ? ?Patient will benefit from skilled therapeutic intervention in order to improve the following deficits and impairments:  Decreased strength, Decreased range of motion, Decreased endurance, Decreased activity tolerance, Decreased balance, Impaired flexibility, Decreased coordination, Abnormal gait, Hypermobility, Improper body mechanics, Pain, Increased muscle spasms, Postural dysfunction, Decreased scar mobility, Decreased mobility, Hypomobility, Difficulty walking ? ?Visit Diagnosis: ?Sacrococcygeal disorders, not elsewhere classified ? ?Other abnormalities of gait and mobility ? ?Unspecified lack of coordination ? ?Lumbar pain ? ?Neck pain ? ?Neuropathy ? ? ? ? ?Problem List ?Patient Active Problem List  ? Diagnosis Date Noted  ? Neuropathy 01/14/2021  ? Pelvic pressure in female 01/14/2021  ? Abdominal pain 12/05/2019  ? Neck pain 10/25/2019  ? Chronic constipation 10/25/2019  ? Age-related osteoporosis without current pathological fracture  12/10/2018  ? Panic attack 06/23/2017  ? Cystocele without uterine prolapse 12/29/2016  ? Vaginal atrophy 11/18/2016  ? Allergic rhinitis 05/14/2015  ? Blood pressure elevated without history of HTN 05/14/2015

## 2021-05-31 ENCOUNTER — Encounter: Payer: Medicare Other | Admitting: Physical Therapy

## 2021-06-01 DIAGNOSIS — H43811 Vitreous degeneration, right eye: Secondary | ICD-10-CM | POA: Diagnosis not present

## 2021-06-02 ENCOUNTER — Ambulatory Visit: Payer: Medicare Other | Admitting: Physical Therapy

## 2021-06-02 ENCOUNTER — Encounter: Payer: Self-pay | Admitting: Family Medicine

## 2021-06-02 DIAGNOSIS — M542 Cervicalgia: Secondary | ICD-10-CM | POA: Diagnosis not present

## 2021-06-02 DIAGNOSIS — G629 Polyneuropathy, unspecified: Secondary | ICD-10-CM | POA: Diagnosis not present

## 2021-06-02 DIAGNOSIS — M533 Sacrococcygeal disorders, not elsewhere classified: Secondary | ICD-10-CM

## 2021-06-02 DIAGNOSIS — R2689 Other abnormalities of gait and mobility: Secondary | ICD-10-CM | POA: Diagnosis not present

## 2021-06-02 DIAGNOSIS — M545 Low back pain, unspecified: Secondary | ICD-10-CM

## 2021-06-02 DIAGNOSIS — R279 Unspecified lack of coordination: Secondary | ICD-10-CM | POA: Diagnosis not present

## 2021-06-02 NOTE — Patient Instructions (Signed)
For strengthening neck and shoulders: ? ?Locust pose  ?Pillow under hips if needed for decreased low back pain ? ?Palms face midline by hips  ?Finger tips shooting straight down  ?Imagine holding pencil under your armpits ?Draw shoulders away from ears ?Inhale ?Exhale lift chest up slightly without feeling it in your back. The bend happens in the midback  ?(keep chin tucked)  ? ? ?__ ? ?For low back strengthening: ? ?Multifidis twist  ?Band is on doorknob: stand further away from door (facing perpendicular)  ? ?Twisting trunk without moving the hips and knees ?Hold band at the level of ribcage, elbows bent,shoulder blades roll back and down like squeezing a pencil under armpit ?  ?Exhale twist,.10-15 deg away from door without moving your hips/ knees, press more weight on the side of the sitting bones/ foot opp of your direction of turn as your counterweight. Continue to maintain equal weight through legs.  Keep knee unlocked.  ?30 reps ? ?___ ? ?Deep core level 1 ( breathing)  10 reps  x 3  ? ?Be attentive when the upper ab try to steal the show ?Allow low ab to relax  ? ? ?

## 2021-06-03 MED ORDER — CITALOPRAM HYDROBROMIDE 20 MG PO TABS: 30.0000 mg | ORAL_TABLET | Freq: Every day | ORAL | 0 refills | Status: DC

## 2021-06-03 NOTE — Telephone Encounter (Signed)
Please update her Celexa Rx for 30mg  daily  ?

## 2021-06-03 NOTE — Therapy (Signed)
[place] ?[place] [facility] ?[place], [place], [address] ?Phone: [phone]   Fax:  [phone] ? ?Physical Therapy Treatment ? ?Patient Details  ?Name: Amber Hayes ?MRN: [id number] ?Date of Birth: 1955-10-22 ?No data recorded ? ?Encounter Date: 06/02/2021 ? ? PT End of Session - 06/02/21 1219   ? ? Visit Number 6   ? Number of Visits 10   ? Date for PT Re-Evaluation 06/08/21   ? PT Start Time 1214   ? PT Stop Time 1310   ? PT Time Calculation (min) 56 min   ? Activity Tolerance Patient tolerated treatment well   ? Behavior During Therapy [facility] for tasks assessed/performed   ? ?  ?  ? ?  ? ? ?Past Medical History:  ?Diagnosis Date  ? Osteoporosis   ? ? ?Past Surgical History:  ?Procedure Laterality Date  ? AUGMENTATION MAMMAPLASTY Bilateral 2003  ? CATARACT EXTRACTION W/PHACO Right 02/13/2019  ? Procedure: CATARACT EXTRACTION PHACO AND INTRAOCULAR LENS PLACEMENT (IOC) RIGHT 3.81 00:38.5 9.9%;  Surgeon: Leandrew Koyanagi, MD;  Location: [place];  Service: Ophthalmology;  Laterality: Right;  ? CATARACT EXTRACTION W/PHACO Left 04/10/2019  ? Procedure: CATARACT EXTRACTION PHACO AND INTRAOCULAR LENS PLACEMENT (IOC) LEFT 2.02 00:27.3 7.4%;  Surgeon: Leandrew Koyanagi, MD;  Location: [place];  Service: Ophthalmology;  Laterality: Left;  ? COLONOSCOPY WITH PROPOFOL N/A 10/19/2018  ? Procedure: COLONOSCOPY WITH PROPOFOL;  Surgeon: Jonathon Bellows, MD;  Location: [facility];  Service: Gastroenterology;  Laterality: N/A;  ? EYE SURGERY  1999  ? Lasik  ? EYE SURGERY  2017  ? Torn Retina  ? ? ?There were no vitals filed for this visit. ? ? Subjective Assessment - 06/02/21 1224   ? ? Subjective Pt has notice the pubic pressure sensaiton is 70% better.   ? 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   ? Patient Stated Goals sit in a chair comfortable, read comfortably without neck   ? ?  ?  ? ?  ? ? ? ? ?  [facility] PT Assessment - 06/03/21 1419   ? ?  ? Coordination  ? Coordination and Movement Description upper trap overuse with multifidis exericise abd deep core level and withheld progression to level 2   ?  ? Other:  ? Other/ Comments prone with cervciothorac strengthening, with increased lumbar lordosis   ? ?  ?  ? ?  ? ? ? ? ? ? ? ? ? ? ? ? ? ? ? ? [facility] Adult PT Treatment/Exercise - 06/03/21 1421   ? ?  ? Therapeutic Activites   ? Other Therapeutic Activities showed anatomy/ physiology images for stabilization of posterior back and thoracic, explained role of upper trap overuse, explained lengthened breathing for decreasing anxiety and optimizing pelvic floor   ?  ? Neuro Re-ed   ? Neuro Re-ed Details  cued for proper coordination with deep core 1 with less overuse of oblique mm in level 1 withheld level 2   ?  ? Exercises  ? Other Exercises  see pt instructions   ? ?  ?  ? ?  ? ? ? ? ? ? ? ? ? ? ? ? ? ? ? PT Long Term Goals - 06/02/21 1219   ? ?  ? PT LONG TERM GOAL #1  ? Title Pt wil report she emptied all the way with urination and bowel movements across 75% of the  time in order in order to practice proper hygiene   ? Time 8   ? Period Weeks   ? Status Achieved   ? Target Date 05/25/21   ?  ? PT LONG TERM GOAL #2  ? Title Pt will demo proper deep core coordination without cues to correct dyscoordination of deep core   ? Time 4   ? Period Weeks   ? Status On-going   ? Target Date 04/27/21   ?  ? PT LONG TERM GOAL #3  ? Title Pt will demo proper body mechanics with against gravity tasks ( dog care, lifting dog food bags, hiking, biking hills) and fitness routine to minimize ab/ pelvic straining   ? Time 8   ? Period Weeks   ? Status On-going   ? Target Date 05/25/21   ?  ? PT LONG TERM GOAL #4  ? Title sit in a chair comfortably, read comfortably, wake up without neck pain/ decreased HA by 50%  in order to enjoy her hobby   ? Baseline pain in neck , 9/10   ? Time 8   ? Period Weeks   ? Status On-going   ? Target  Date 05/25/21   ?  ? PT LONG TERM GOAL #5  ? Title Pt will report being able to stand and cook for 1  hour with < 4/10 LBP   ? Baseline 7/10   ? Time 4   ? Period Weeks   ? Status Achieved   ? Target Date 04/27/21   ?  ? PT LONG TERM GOAL #6  ? Title Pt will report decreased Numbness in B under all toes top and bottom occurs when sitting across 75% of the time in order to promote better circulation   ? Time 10   ? Period Weeks   ? Status On-going   ? Target Date 06/08/21   ?  ? PT LONG TERM GOAL #7  ? Title Pt will demo increased strength on RLE from 3/5 to > 4/5 and L knee flexion from 3/5 to > 4/5 in order to walk with less risk for falls.  ( 06/02/21:  BLE 4/5)   ? Time 4   ? Period Weeks   ? Status Achieved   ? Target Date 04/27/21   ?  ? PT LONG TERM GOAL #8  ? Title Pt will demo > 2  pt change for FOTO score Lumbar ( 83 pt to < 81 pts ) and PFDI Prolapse 4 pts to <2 pts) in order to improve QOL and ADLs   ? Time 10   ? Period Weeks   ? Status On-going   ? Target Date 06/08/21   ? ?  ?  ? ?  ? ? ? ? ? ? ? ? Plan - 06/02/21 1219   ? ? Clinical Impression Statement Pt progressed to deep core level 1 but not level 2 because pt required more focus and cues for less upper trap overuse to correct chest breathing. Pt demo'd improved technique post Tx. Added cervicoscapular stabilization isometric to continue improving upright posture and decreasing thoracic kyphosis. Pt required pillow under bellyin prone position with this exercise due to increased lumbar lordosis. Reviewed multifidis strengthening HEP. Pt continues to benefit from skilled PT and plan to progress to deep core level 2 at next session.  ? ?  ? Personal Factors and Comorbidities Fitness   ? Examination-Activity Limitations Locomotion Level;Stand;Other   ? Stability/Clinical Decision Making  Evolving/Moderate complexity   ? Rehab Potential Good   ? PT Frequency 1x / week   ? PT Duration Other (comment)   10  ? PT Treatment/Interventions Neuromuscular  re-education;Functional mobility training;Stair training;Gait training;Scar mobilization;Manual techniques;Patient/family education;Therapeutic activities;Therapeutic exercise;Splinting;Taping;Joint Manipulations;Spinal Manipulations;Dry needling;Energy conservation;Moist Heat;Traction;Cryotherapy;Canalith Repostioning;ADLs/Self Care Home Management;Balance training   ? Consulted and Agree with Plan of Care Patient   ? ?  ?  ? ?  ? ? ?Patient will benefit from skilled therapeutic intervention in order to improve the following deficits and impairments:  Decreased strength, Decreased range of motion, Decreased endurance, Decreased activity tolerance, Decreased balance, Impaired flexibility, Decreased coordination, Abnormal gait, Hypermobility, Improper body mechanics, Pain, Increased muscle spasms, Postural dysfunction, Decreased scar mobility, Decreased mobility, Hypomobility, Difficulty walking ? ?Visit Diagnosis: ?Sacrococcygeal disorders, not elsewhere classified ? ?Other abnormalities of gait and mobility ? ?Unspecified lack of coordination ? ?Lumbar pain ? ?Neck pain ? ?Neuropathy ? ? ? ? ?Problem List ?Patient Active Problem List  ? Diagnosis Date Noted  ? Neuropathy 01/14/2021  ? Pelvic pressure in female 01/14/2021  ? Abdominal pain 12/05/2019  ? Neck pain 10/25/2019  ? Chronic constipation 10/25/2019  ? Age-related osteoporosis without current pathological fracture 12/10/2018  ? Panic attack 06/23/2017  ? Cystocele without uterine prolapse 12/29/2016  ? Vaginal atrophy 11/18/2016  ? Allergic rhinitis 05/14/2015  ? Blood pressure elevated without history of HTN 05/14/2015  ? Decreased libido 05/14/2015  ? Abnormal bruising 05/14/2015  ? Pure hypercholesterolemia 05/14/2015  ? Anxiety 02/16/2015  ? Atypical chest pain 07/10/2009  ? Alcohol use 12/05/2007  ? ? ?Jerl Mina, PT ?06/03/2021, 2:30 PM ? ? ?[facility] ?[place], [place],  [address] ?Phone: [phone]   Fax:  [phone] ? ?Name: Annalissa Bonnes ?MRN: [id number] ?Date of Birth: 1955-03-04 ? ? ? ?

## 2021-06-07 ENCOUNTER — Encounter: Payer: Medicare Other | Admitting: Physical Therapy

## 2021-06-14 ENCOUNTER — Encounter: Payer: Medicare Other | Admitting: Physical Therapy

## 2021-06-16 ENCOUNTER — Ambulatory Visit: Payer: Medicare Other | Admitting: Physical Therapy

## 2021-06-16 DIAGNOSIS — M533 Sacrococcygeal disorders, not elsewhere classified: Secondary | ICD-10-CM

## 2021-06-16 DIAGNOSIS — M542 Cervicalgia: Secondary | ICD-10-CM

## 2021-06-16 DIAGNOSIS — G629 Polyneuropathy, unspecified: Secondary | ICD-10-CM | POA: Diagnosis not present

## 2021-06-16 DIAGNOSIS — R279 Unspecified lack of coordination: Secondary | ICD-10-CM | POA: Diagnosis not present

## 2021-06-16 DIAGNOSIS — M545 Low back pain, unspecified: Secondary | ICD-10-CM | POA: Diagnosis not present

## 2021-06-16 DIAGNOSIS — R2689 Other abnormalities of gait and mobility: Secondary | ICD-10-CM

## 2021-06-16 NOTE — Therapy (Signed)
[place] MAIN [facility] SERVICES [address] [place], [place], [address] Phone: [phone]   Fax:  [phone]  Physical Therapy Treatment / Recert from 3/7 23 to 5/00/37   Patient Details  Name: Amber Hayes MRN: [id number] Date of Birth: [birth year] No data recorded  Encounter Date: 06/16/2021   PT End of Session - 06/16/21 1328     Visit Number 7    Number of Visits 10    Date for PT Re-Evaluation 45/03/88   recert 09/21/98   PT Start Time 3491    PT Stop Time 1311    PT Time Calculation (min) 67 min    Activity Tolerance Patient tolerated treatment well    Behavior During Therapy [facility] for tasks assessed/performed             Past Medical History:  Diagnosis Date   Osteoporosis     Past Surgical History:  Procedure Laterality Date   AUGMENTATION MAMMAPLASTY Bilateral 2003   CATARACT EXTRACTION W/PHACO Right 02/13/2019   Procedure: CATARACT EXTRACTION PHACO AND INTRAOCULAR LENS PLACEMENT (IOC) RIGHT 3.81 00:38.5 9.9%;  Surgeon: Leandrew Koyanagi, MD;  Location: [facility];  Service: Ophthalmology;  Laterality: Right;   CATARACT EXTRACTION W/PHACO Left 04/10/2019   Procedure: CATARACT EXTRACTION PHACO AND INTRAOCULAR LENS PLACEMENT (IOC) LEFT 2.02 00:27.3 7.4%;  Surgeon: Leandrew Koyanagi, MD;  Location: [facility];  Service: Ophthalmology;  Laterality: Left;   COLONOSCOPY WITH PROPOFOL N/A 10/19/2018   Procedure: COLONOSCOPY WITH PROPOFOL;  Surgeon: Jonathon Bellows, MD;  Location: [facility] ENDOSCOPY;  Service: Gastroenterology;  Laterality: N/A;   EYE SURGERY  1999   Lasik   EYE SURGERY  2017   Torn Retina    There were no vitals filed for this visit.   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 Pt did not have a chance to do her exericses. Pt noticed her pubic pressure sensation has returned to  its orginal level compared to 70% improved the 2 weeks ago. Neck pain is still feeling good with 80%. LBP hurt 6/10.  Pt has not had a HA for atleast a week.    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    Patient Stated Goals sit in a chair comfortable, read comfortably without neck                [facility] PT Assessment - 06/16/21 1325       Floor to Stand   Comments half kneeling, poor alignment      Other:   Other/ Comments stepping strategy slow, scissoring with lunge position, simulated pulling dog with resistance band / vaccuuming and pt demo'd excessive use of GH joint      Palpation   Palpation comment decreased thoracic kyphosis, lumbar lordosis      Ambulation/Gait   Gait Comments shoulders posterior to COM                           [facility] Adult PT Treatment/Exercise - 06/16/21 1325       Therapeutic Activites    Other Therapeutic Activities practiced stepping strategies with pertubation at weight, simulated task with pulling dog on leash with less risk for shoulder injury and fall risk , cued for floor <> stand to promote pelvic girdle alignment, provided back  stretches, discussed decreasing loaded tasks and to delegate to other people to minimzie overdoing and worsening proalpse/ pubic sensation Sx      Neuro Re-ed    Neuro Re-ed Details  cued for wider steps, gait mechanics longer stride, stepping strategies                          PT Long Term Goals - 06/16/21 1336       PT LONG TERM GOAL #1   Title Pt wil report she emptied all the way with urination and bowel movements across 75% of the time in order in order to practice proper hygiene    Time 8    Period Weeks    Status Achieved    Target Date 05/25/21      PT LONG TERM GOAL #2   Title Pt will demo proper deep core coordination without cues to correct dyscoordination of deep core    Time 4    Period Weeks    Status  On-going    Target Date 07/14/21      PT LONG TERM GOAL #3   Title Pt will demo proper body mechanics with against gravity tasks ( dog care, lifting dog food bags, hiking, biking hills) and fitness routine to minimize ab/ pelvic straining    Time 8    Period Weeks    Status Partially Met    Target Date 08/11/21      PT LONG TERM GOAL #4   Title sit in a chair comfortably, read comfortably, wake up without neck pain/ decreased HA by 50%  in order to enjoy her hobby    Baseline pain in neck , 9/10 ( 06/16/21, 1/10)    Time 8    Period Weeks    Status Achieved      PT LONG TERM GOAL #5   Title Pt will report being able to stand and cook for 1  hour with < 4/10 LBP    Baseline 7/10    Time 4    Period Weeks    Status Achieved    Target Date 04/27/21      PT LONG TERM GOAL #6   Title Pt will report decreased Numbness in B under all toes top and bottom occurs when sitting across 75% of the time in order to promote better circulation    Time 10    Period Weeks    Status On-going    Target Date 08/25/21      PT LONG TERM GOAL #7   Title Pt will demo increased strength on RLE from 3/5 to > 4/5 and L knee flexion from 3/5 to > 4/5 in order to walk with less risk for falls.  ( 06/02/21:  BLE 4/5)    Time 4    Period Weeks    Status Achieved    Target Date 04/27/21      PT LONG TERM GOAL #8   Title Pt will demo > 2  pt change for FOTO score Lumbar ( 83 pt to < 81 pts ) and PFDI Prolapse 4 pts to <2 pts) in order to improve QOL and ADLs    Time 10    Period Weeks    Status On-going    Target Date 08/25/21                   Plan - 06/16/21 1328     Clinical Impression Statement Pt has  achieved 5/8 of her goals and is progressing well towards remaining goals.  Prior to a week of lots of bending, lifting , pulling as a volunteer, pt reported improved pubic pressure pain by 70%,  neck pain by 80%, LBP by 50% and able to sleep on her R side without pain. After week of lifting,  pulling, bending, pt reports neck pain remains improved at level of 80% but the LBP was at 6/10 today and pubic pressure sensation returned to original.  Discussed decreasing loaded tasks and to delegate to other people to minimzie overdoing and worsening prolapse/ pubic sensation Sx. Provided stretches for back tensions to do daily and with tasks. Provided body mechanics to minimize LBP with vacuuming and pulling dog on leash.   Practiced stepping strategies  and wider BOS with pertubation at weight, simulated task with pulling dog on leash with less risk for shoulder injury and fall risk.  Cued for gait training for wider BOS and less scissoring gait and longer stride.  Cued for floor <> stand to promote pelvic girdle alignment,.  Pt continues to benefit from skilled PT to achieve remaining goals.    Personal Factors and Comorbidities Fitness    Examination-Activity Limitations Locomotion Level;Stand;Other    Stability/Clinical Decision Making Evolving/Moderate complexity    Rehab Potential Good    PT Frequency 1x / week    PT Duration Other (comment)   10   PT Treatment/Interventions Neuromuscular re-education;Functional mobility training;Stair training;Gait training;Scar mobilization;Manual techniques;Patient/family education;Therapeutic activities;Therapeutic exercise;Splinting;Taping;Joint Manipulations;Spinal Manipulations;Dry needling;Energy conservation;Moist Heat;Traction;Cryotherapy;Canalith Repostioning;ADLs/Self Care Home Management;Balance training    Consulted and Agree with Plan of Care Patient             Patient will benefit from skilled therapeutic intervention in order to improve the following deficits and impairments:  Decreased strength, Decreased range of motion, Decreased endurance, Decreased activity tolerance, Decreased balance, Impaired flexibility, Decreased coordination, Abnormal gait, Hypermobility, Improper body mechanics, Pain, Increased muscle spasms, Postural  dysfunction, Decreased scar mobility, Decreased mobility, Hypomobility, Difficulty walking  Visit Diagnosis: Other abnormalities of gait and mobility  Sacrococcygeal disorders, not elsewhere classified  Unspecified lack of coordination  Lumbar pain  Neck pain  Neuropathy     Problem List Patient Active Problem List   Diagnosis Date Noted   Neuropathy 01/14/2021   Pelvic pressure in female 01/14/2021   Abdominal pain 12/05/2019   Neck pain 10/25/2019   Chronic constipation 10/25/2019   Age-related osteoporosis without current pathological fracture 12/10/2018   Panic attack 06/23/2017   Cystocele without uterine prolapse 12/29/2016   Vaginal atrophy 11/18/2016   Allergic rhinitis 05/14/2015   Blood pressure elevated without history of HTN 05/14/2015   Decreased libido 05/14/2015   Abnormal bruising 05/14/2015   Pure hypercholesterolemia 05/14/2015   Anxiety 02/16/2015   Atypical chest pain 07/10/2009   Alcohol use 12/05/2007    Jerl Mina, PT 06/16/2021, 1:38 PM  [place] MAIN [facility] SERVICES [address] [place], [place], [address] Phone: [phone]   Fax:  [phone]  Name: Amber Hayes MRN: [id number] Date of Birth: [birth year]

## 2021-06-16 NOTE — Patient Instructions (Signed)
     Transition from standing to floor :  stand to floor transfer :      _ slow     _ mini squat      _ crawl down with one hand on thigh      _downward dog  - >  shoulders down and back-  walk the dog ( knee bents to lengthe hamstrings)      Floor to stand :   downward dog   Feet are wider than hips, crawl hands back, butt is back, knees behind toes -> squat  Hands at waist , elbows back, chest lifts   ___  Stepping strategy and elbow by side to pull down to put less strain on shoulder socket   Stepping strategy wide as your hips,   ___   Walking wider feet, pick up knees, longer stride,   ____ Standing with more weight on [place] and heels to set shoulders in place

## 2021-06-24 ENCOUNTER — Ambulatory Visit: Payer: Medicare Other | Admitting: Physical Therapy

## 2021-06-30 ENCOUNTER — Ambulatory Visit: Payer: Medicare Other | Attending: Family Medicine | Admitting: Physical Therapy

## 2021-06-30 DIAGNOSIS — M545 Low back pain, unspecified: Secondary | ICD-10-CM | POA: Diagnosis not present

## 2021-06-30 DIAGNOSIS — G629 Polyneuropathy, unspecified: Secondary | ICD-10-CM | POA: Diagnosis not present

## 2021-06-30 DIAGNOSIS — R279 Unspecified lack of coordination: Secondary | ICD-10-CM | POA: Insufficient documentation

## 2021-06-30 DIAGNOSIS — M533 Sacrococcygeal disorders, not elsewhere classified: Secondary | ICD-10-CM | POA: Insufficient documentation

## 2021-06-30 DIAGNOSIS — M542 Cervicalgia: Secondary | ICD-10-CM | POA: Insufficient documentation

## 2021-06-30 DIAGNOSIS — R2689 Other abnormalities of gait and mobility: Secondary | ICD-10-CM | POA: Diagnosis not present

## 2021-06-30 NOTE — Patient Instructions (Signed)
Practice proper pelvic floor coordination  Inhale: expand pelvic floor muscles Exhale" "j" scoop, allow pelvic floor to close, lift first before belly sinks   ( not "draw abdominal muscle to spine" or strain with abdominal muscles")   

## 2021-06-30 NOTE — Therapy (Signed)
[place] [facility] MAIN [facility] SERVICES [address] [place], [place], [address] Phone: [phone]   Fax:  [phone]  Physical Therapy Treatment  Patient Details  Name: Amber Hayes MRN: [id number] Date of Birth: 1955/08/15 No data recorded  Encounter Date: 06/30/2021   PT End of Session - 06/30/21 1400     Visit Number 8    Number of Visits 10    Date for PT Re-Evaluation 08/25/21   recert 06/16/21   PT Start Time 1100    PT Stop Time 1205    PT Time Calculation (min) 65 min    Activity Tolerance Patient tolerated treatment well    Behavior During Therapy [facility] for tasks assessed/performed             Past Medical History:  Diagnosis Date   Osteoporosis     Past Surgical History:  Procedure Laterality Date   AUGMENTATION MAMMAPLASTY Bilateral 2003   CATARACT EXTRACTION W/PHACO Right 02/13/2019   Procedure: CATARACT EXTRACTION PHACO AND INTRAOCULAR LENS PLACEMENT (IOC) RIGHT 3.81 00:38.5 9.9%;  Surgeon: Lockie Mola, MD;  Location: [facility];  Service: Ophthalmology;  Laterality: Right;   CATARACT EXTRACTION W/PHACO Left 04/10/2019   Procedure: CATARACT EXTRACTION PHACO AND INTRAOCULAR LENS PLACEMENT (IOC) LEFT 2.02 00:27.3 7.4%;  Surgeon: Lockie Mola, MD;  Location: [facility];  Service: Ophthalmology;  Laterality: Left;   COLONOSCOPY WITH PROPOFOL N/A 10/19/2018   Procedure: COLONOSCOPY WITH PROPOFOL;  Surgeon: Wyline Mood, MD;  Location: [facility];  Service: Gastroenterology;  Laterality: N/A;   EYE SURGERY  1999   Lasik   EYE SURGERY  2017   Torn Retina    There were no vitals filed for this visit.   Subjective Assessment - 06/30/21 1107     Subjective Pt reported her pubic pain is worse. Pt jumped on a trampoline with grandson and leaked. Pt is planning on helping a friend with moving.    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    Patient  Stated Goals sit in a chair comfortable, read comfortably without neck                            Pelvic Floor Special Questions - 06/30/21 1401     External Palpation standing: slightly lowered bladder within introitus    Pelvic Floor Internal Exam pt consented verbally without contraindications    Exam Type Vaginal    Palpation tightness along anterior mm B, lowered bladder slightly past pubic symp, tenderness at 11'oclock ( decreased tenderess post Tx), slightly upward movement after training but required excessive cues for less overuse of abdominal mm    Strength weak squeeze, no lift               [facility] Adult PT Treatment/Exercise - 06/30/21 1403       Therapeutic Activites    Other Therapeutic Activities explained the importance of minimizing straining pelvic floor/ minimizing pubic pain with loaded activities such as helping lifting box when helping friend move,      Neuro Re-ed    Neuro Re-ed Details  cued for anterior tilt of pelvis, less ab overuse      Moist Heat Therapy   Number Minutes Moist Heat 5 Minutes    Moist Heat Location --   perineum through sheets ( butterfly position to promote anterior til of pelvis)     Manual Therapy  Internal Pelvic Floor STM/MWM and palpation modifed for comfort and less tenderness at anterior mm to promote lengthening and upward movemetn of pelvic floor                          PT Long Term Goals - 06/30/21 1405       PT LONG TERM GOAL #1   Title Pt wil report she emptied all the way with urination and bowel movements across 75% of the time in order in order to practice proper hygiene    Time 8    Period Weeks    Status Achieved    Target Date 05/25/21      PT LONG TERM GOAL #2   Title Pt will demo proper deep core coordination without cues to correct dyscoordination of deep core    Time 4    Period Weeks    Status On-going    Target Date 07/14/21      PT LONG TERM GOAL #3   Title  Pt will demo proper body mechanics with against gravity tasks ( dog care, lifting dog food bags, hiking, biking hills) and fitness routine to minimize ab/ pelvic straining    Time 8    Period Weeks    Status Partially Met    Target Date 08/11/21      PT LONG TERM GOAL #4   Title sit in a chair comfortably, read comfortably, wake up without neck pain/ decreased HA by 50%  in order to enjoy her hobby    Baseline pain in neck , 9/10 ( 06/16/21, 1/10)    Time 8    Period Weeks    Status Achieved      PT LONG TERM GOAL #5   Title Pt will report being able to stand and cook for 1  hour with < 4/10 LBP    Baseline 7/10    Time 4    Period Weeks    Status Achieved    Target Date 04/27/21      PT LONG TERM GOAL #6   Title Pt will report decreased Numbness in B under all toes top and bottom occurs when sitting across 75% of the time in order to promote better circulation    Time 10    Period Weeks    Status On-going    Target Date 08/25/21      PT LONG TERM GOAL #7   Title Pt will demo increased strength on RLE from 3/5 to > 4/5 and L knee flexion from 3/5 to > 4/5 in order to walk with less risk for falls.  ( 06/02/21:  BLE 4/5)    Time 4    Period Weeks    Status Achieved    Target Date 04/27/21      PT LONG TERM GOAL #8   Title Pt will demo > 2  pt change for FOTO score Lumbar ( 83 pt to < 81 pts ) and PFDI Prolapse 4 pts to <2 pts) in order to improve QOL and ADLs    Time 10    Period Weeks    Status On-going    Target Date 08/25/21                   Plan - 06/30/21 1400     Clinical Impression Statement Pt  reports worsening of pubic area and leakage after jumping of trampoline with grandson.   Internal pelvic floor assessment showed  slightly lowered bladder, tenderness at R pelvic floor > L, and tightness of anterior pelvic floor mm. These areas showed improvement with Tx and she demo'd slightly upward movement after training but required excessive cues for less  overuse of abdominal mm.  Advised pt to limit excessive loading to pelvic floor with increased loaded activities such as lifting and carrying boxes when helping with friend. Advised to delegate task and hire moving company. When sorting boxes, place box higher off ground to minimize excessive and repetitive bending. Pt continues to benefit from skilled PT.   Personal Factors and Comorbidities Fitness    Examination-Activity Limitations Locomotion Level;Stand;Other    Stability/Clinical Decision Making Evolving/Moderate complexity    Rehab Potential Good    PT Frequency 1x / week    PT Duration Other (comment)   10   PT Treatment/Interventions Neuromuscular re-education;Functional mobility training;Stair training;Gait training;Scar mobilization;Manual techniques;Patient/family education;Therapeutic activities;Therapeutic exercise;Splinting;Taping;Joint Manipulations;Spinal Manipulations;Dry needling;Energy conservation;Moist Heat;Traction;Cryotherapy;Canalith Repostioning;ADLs/Self Care Home Management;Balance training    Consulted and Agree with Plan of Care Patient             Patient will benefit from skilled therapeutic intervention in order to improve the following deficits and impairments:  Decreased strength, Decreased range of motion, Decreased endurance, Decreased activity tolerance, Decreased balance, Impaired flexibility, Decreased coordination, Abnormal gait, Hypermobility, Improper body mechanics, Pain, Increased muscle spasms, Postural dysfunction, Decreased scar mobility, Decreased mobility, Hypomobility, Difficulty walking  Visit Diagnosis: Sacrococcygeal disorders, not elsewhere classified  Other abnormalities of gait and mobility  Unspecified lack of coordination  Lumbar pain  Neck pain  Neuropathy     Problem List Patient Active Problem List   Diagnosis Date Noted   Neuropathy 01/14/2021   Pelvic pressure in female 01/14/2021   Abdominal pain 12/05/2019    Neck pain 10/25/2019   Chronic constipation 10/25/2019   Age-related osteoporosis without current pathological fracture 12/10/2018   Panic attack 06/23/2017   Cystocele without uterine prolapse 12/29/2016   Vaginal atrophy 11/18/2016   Allergic rhinitis 05/14/2015   Blood pressure elevated without history of HTN 05/14/2015   Decreased libido 05/14/2015   Abnormal bruising 05/14/2015   Pure hypercholesterolemia 05/14/2015   Anxiety 02/16/2015   Atypical chest pain 07/10/2009   Alcohol use 12/05/2007    Jerl Mina, PT 06/30/2021, 2:06 PM  [place] MAIN [facility] SERVICES [address] [place], [place], [address] Phone: [phone]   Fax:  [phone]  Name: Tiauna Whisnant MRN: [id number] Date of Birth: January 27, 1955

## 2021-07-28 ENCOUNTER — Ambulatory Visit: Payer: Medicare Other | Admitting: Physical Therapy

## 2021-08-09 ENCOUNTER — Ambulatory Visit: Payer: Medicare Other | Admitting: Physical Therapy

## 2021-08-18 ENCOUNTER — Ambulatory Visit: Payer: Medicare Other | Admitting: Physical Therapy

## 2021-08-29 ENCOUNTER — Telehealth: Payer: Medicare Other | Admitting: Nurse Practitioner

## 2021-08-29 DIAGNOSIS — U071 COVID-19: Secondary | ICD-10-CM | POA: Diagnosis not present

## 2021-08-29 MED ORDER — MOLNUPIRAVIR EUA 200MG CAPSULE: 4.0000 | ORAL_CAPSULE | Freq: Two times a day (BID) | ORAL | 0 refills | Status: AC

## 2021-08-29 NOTE — Patient Instructions (Signed)
  Caryn Bee, thank you for joining Claiborne Rigg, NP for today's virtual visit.  While this provider is not your primary care provider (PCP), if your PCP is located in our provider database this encounter information will be shared with them immediately following your visit.  Consent: (Patient) Amber Hayes provided verbal consent for this virtual visit at the beginning of the encounter.  Current Medications:  Current Outpatient Medications:    molnupiravir EUA (LAGEVRIO) 200 mg CAPS capsule, Take 4 capsules (800 mg total) by mouth 2 (two) times daily for 5 days., Disp: 40 capsule, Rfl: 0   busPIRone (BUSPAR) 7.5 MG tablet, Take 1 tablet (7.5 mg total) by mouth 2 (two) times daily., Disp: 180 tablet, Rfl: 1   citalopram (CELEXA) 20 MG tablet, Take 1.5 tablets (30 mg total) by mouth daily., Disp: 135 tablet, Rfl: 0   Medications ordered in this encounter:  Meds ordered this encounter  Medications   molnupiravir EUA (LAGEVRIO) 200 mg CAPS capsule    Sig: Take 4 capsules (800 mg total) by mouth 2 (two) times daily for 5 days.    Dispense:  40 capsule    Refill:  0    Order Specific Question:   Supervising Provider    Answer:   Hyacinth Meeker, BRIAN [[id number]]     *If you need refills on other medications prior to your next appointment, please contact your pharmacy*  Follow-Up: Call back or seek an in-person evaluation if the symptoms worsen or if the condition fails to improve as anticipated.  Other Instructions Please keep well-hydrated and get plenty of rest. Start a saline nasal rinse to flush out your nasal passages. You can use plain Mucinex to help thin congestion. If you have a humidifier, you can use this daily as needed.    You are to wear a mask for 5 days from onset of your symptoms.  After day 5, if you have had no fever and you are feeling better with NO symptoms, you can end masking. Keep in mind you can be contagious 10 days from the onset of symptoms  After day 5 if you  have a fever or are having significant symptoms, please wear your mask for full 10 days.   If you note any worsening of symptoms, any significant shortness of breath or any chest pain, please seek ER evaluation ASAP.  Please do not delay care!    If you have been instructed to have an in-person evaluation today at a local Urgent Care facility, please use the link below. It will take you to a list of all of our available [facility], including address, phone number and hours of operation. Please do not delay care.  [facility]  If you or a family member do not have a primary care provider, use the link below to schedule a visit and establish care. When you choose a [facility] primary care physician or advanced practice provider, you gain a long-term partner in health. Find a Primary Care Provider  Learn more about [facility]'s in-office and virtual care options:  - Get Care Now

## 2021-08-29 NOTE — Progress Notes (Signed)
Virtual Visit Consent   Amber Hayes, you are scheduled for a virtual visit with a [facility] provider today. Just as with appointments in the office, your consent must be obtained to participate. Your consent will be active for this visit and any virtual visit you may have with one of our providers in the next 365 days. If you have a MyChart account, a copy of this consent can be sent to you electronically.  As this is a virtual visit, video technology does not allow for your provider to perform a traditional examination. This may limit your provider's ability to fully assess your condition. If your provider identifies any concerns that need to be evaluated in person or the need to arrange testing (such as labs, EKG, etc.), we will make arrangements to do so. Although advances in technology are sophisticated, we cannot ensure that it will always work on either your end or our end. If the connection with a video visit is poor, the visit may have to be switched to a telephone visit. With either a video or telephone visit, we are not always able to ensure that we have a secure connection.  By engaging in this virtual visit, you consent to the provision of healthcare and authorize for your insurance to be billed (if applicable) for the services provided during this visit. Depending on your insurance coverage, you may receive a charge related to this service.  I need to obtain your verbal consent now. Are you willing to proceed with your visit today? Amber Hayes has provided verbal consent on 08/29/2021 for a virtual visit (video or telephone). Claiborne Rigg, NP  Date: 08/29/2021 9:05 AM  Virtual Visit via Video Note   I, Claiborne Rigg, connected with  Amber Hayes  ([id number], 68-Jan-1957) on 08/29/21 at  8:00 AM EDT by a video-enabled telemedicine application and verified that I am speaking with the correct person using two identifiers.  Location: Patient: Virtual Visit Location Patient:  Home Provider: Virtual Visit Location Provider: Home Office   I discussed the limitations of evaluation and management by telemedicine and the availability of in person appointments. The patient expressed understanding and agreed to proceed.    History of Present Illness: Amber Hayes is a 66 y.o. who identifies as a female who was assigned female at birth, and is being seen today for COVID POSITIVE.  Was out of town earlier this week for a funeral. Noted for onset of the following symptoms 2 days ago: Cough, "clogged up ears", headache, nasal congestion. Home COVID test positive yesterday.  Taking aleve for headache and mucinex for cough mucinex. she had COVID several months ago and was prescribed paxlovid. She does not want to take this antiviral again as she states " Everytihing tasted like metal when I took it".   Problems:  Patient Active Problem List   Diagnosis Date Noted   Neuropathy 01/14/2021   Pelvic pressure in female 01/14/2021   Abdominal pain 12/05/2019   Neck pain 10/25/2019   Chronic constipation 10/25/2019   Age-related osteoporosis without current pathological fracture 12/10/2018   Panic attack 06/23/2017   Cystocele without uterine prolapse 12/29/2016   Vaginal atrophy 11/18/2016   Allergic rhinitis 05/14/2015   Blood pressure elevated without history of HTN 05/14/2015   Decreased libido 05/14/2015   Abnormal bruising 05/14/2015   Pure hypercholesterolemia 05/14/2015   Anxiety 02/16/2015   Atypical chest pain 07/10/2009   Alcohol use 12/05/2007    Allergies: No Known Allergies Medications:  Current  Outpatient Medications:    molnupiravir EUA (LAGEVRIO) 200 mg CAPS capsule, Take 4 capsules (800 mg total) by mouth 2 (two) times daily for 5 days., Disp: 40 capsule, Rfl: 0   busPIRone (BUSPAR) 7.5 MG tablet, Take 1 tablet (7.5 mg total) by mouth 2 (two) times daily., Disp: 180 tablet, Rfl: 1   citalopram (CELEXA) 20 MG tablet, Take 1.5 tablets (30 mg total) by  mouth daily., Disp: 135 tablet, Rfl: 0  Observations/Objective: Patient is well-developed, well-nourished in no acute distress.  Resting comfortably at home.  Head is normocephalic, atraumatic.  No labored breathing.  Speech is clear and coherent with logical content.  Patient is alert and oriented at baseline.    Assessment and Plan: 1. Positive self-administered antigen test for COVID-19 - molnupiravir EUA (LAGEVRIO) 200 mg CAPS capsule; Take 4 capsules (800 mg total) by mouth 2 (two) times daily for 5 days.  Dispense: 40 capsule; Refill: 0  INSTRUCTIONS: use a humidifier for nasal congestion Drink plenty of fluids, rest and wash hands frequently to avoid the spread of infection Alternate tylenol and Motrin for relief of fever   Follow Up Instructions: I discussed the assessment and treatment plan with the patient. The patient was provided an opportunity to ask questions and all were answered. The patient agreed with the plan and demonstrated an understanding of the instructions.  A copy of instructions were sent to the patient via MyChart unless otherwise noted below.     The patient was advised to call back or seek an in-person evaluation if the symptoms worsen or if the condition fails to improve as anticipated.  Time:  I spent 11 minutes with the patient via telehealth technology discussing the above problems/concerns.    Claiborne Rigg, NP

## 2021-09-01 NOTE — Therapy (Signed)
[facility] MAIN [facility] SERVICES [address] [place], [place], [address] Phone: [phone]   Fax:  [phone]   Physical Therapy Discharge Summary across 8 visits    Patient Details  Name: Amber Hayes MRN: [id number] Date of Birth: [birth year]   Pt has achieved 7/8 goals and improved with prolapse Sx, urinary, LBP, neck pain. Pt;s spinal/ pelvic alignment have improved significantly which has contributed to gradual improvement of her Sx.  Pt's deep core coordination and strength also improved. FOTO scores for LBP and pelvic issues have improved signficantly.    Pt is IND with proper  body mechanics with functional tasks like pulling leash on dog, squats, picking items to minimize LBP and straining pelvic floor. Pt has self-d/c.     PT Long Term Goals - 06/30/21 1405                PT LONG TERM GOAL #1    Title Pt wil report she emptied all the way with urination and bowel movements across 75% of the time in order in order to practice proper hygiene     Time 8     Period Weeks     Status Achieved     Target Date 05/25/21          PT LONG TERM GOAL #2    Title Pt will demo proper deep core coordination without cues to correct dyscoordination of deep core     Time 4     Period Weeks     Status Achieved     Target Date 07/14/21          PT LONG TERM GOAL #3    Title Pt will demo proper body mechanics with against gravity tasks ( dog care, lifting dog food bags, hiking, biking hills) and fitness routine to minimize ab/ pelvic straining     Time 8     Period Weeks     Status achieved    Target Date 08/11/21          PT LONG TERM GOAL #4    Title sit in a chair comfortably, read comfortably, wake up without neck pain/ decreased HA by 50%  in order to enjoy her hobby     Baseline pain in neck , 9/10 ( 06/16/21, 1/10)     Time 8     Period Weeks     Status Achieved          PT LONG TERM GOAL #5    Title Pt will report being able to stand  and cook for 1  hour with < 4/10 LBP     Baseline 7/10     Time 4     Period Weeks     Status Achieved     Target Date 04/27/21          PT LONG TERM GOAL #6    Title Pt will report decreased Numbness in B under all toes top and bottom occurs when sitting across 75% of the time in order to promote better circulation     Time 10     Period Weeks     Status On-going     Target Date 08/25/21          PT LONG TERM GOAL #7    Title Pt will demo increased strength on RLE from 3/5 to > 4/5 and L knee flexion from 3/5 to > 4/5 in order to walk with less risk for  falls.  ( 06/02/21:  BLE 4/5)     Time 4     Period Weeks     Status Achieved     Target Date 04/27/21          PT LONG TERM GOAL #8    Title Pt will demo > 2  pt change for FOTO score Lumbar ( 83 pt to < 81 pts ) and PFDI Prolapse 4 pts to <2 pts) in order to improve QOL and ADLs       Time 10     Period Weeks     Status Achieved   D/C score  94pts     Target Date 08/25/21                                         Visit Diagnosis: Sacrococcygeal disorders, not elsewhere classified   Other abnormalities of gait and mobility   Unspecified lack of coordination   Lumbar pain   Neck pain   Neuropathy         Problem List     Patient Active Problem List    Diagnosis Date Noted   Neuropathy 01/14/2021   Pelvic pressure in female 01/14/2021   Abdominal pain 12/05/2019   Neck pain 10/25/2019   Chronic constipation 10/25/2019   Age-related osteoporosis without current pathological fracture 12/10/2018   Panic attack 06/23/2017   Cystocele without uterine prolapse 12/29/2016   Vaginal atrophy 11/18/2016   Allergic rhinitis 05/14/2015   Blood pressure elevated without history of HTN 05/14/2015   Decreased libido 05/14/2015   Abnormal bruising 05/14/2015   Pure hypercholesterolemia 05/14/2015   Anxiety 02/16/2015   Atypical chest pain 07/10/2009   Alcohol use 12/05/2007      Mariane Masters,  PT 06/30/2021, 2:06 PM    [facility] MAIN [facility] SERVICES [address] [place], [place], [address] Phone: [phone]   Fax:  [phone]   Name: Amber Hayes MRN: [id number] Date of Birth: 1955/11/24

## 2021-09-08 ENCOUNTER — Other Ambulatory Visit: Payer: Self-pay | Admitting: Family Medicine

## 2021-09-08 DIAGNOSIS — Z1231 Encounter for screening mammogram for malignant neoplasm of breast: Secondary | ICD-10-CM

## 2021-09-14 NOTE — Progress Notes (Unsigned)
[facility] [address] [place] [address] Phone: [phone] Subjective:   INadine Counts, am serving as a scribe for Dr. Antoine Primas.  I'm seeing this patient by the request  of:  Erasmo Downer, MD  CC: toe tingling   [id number]  Amber Hayes is a 66 y.o. female coming in with complaint of tingling in her toes. Patient states has been happening for a while. It's now moving up her leg started a couple of months ago. Feels like she has on compression socks. Sometimes will go numb. Tingling is consistent. Doesn't stop her from activities.   B6 normal B12 403 one year ago  \    Past Medical History:  Diagnosis Date   Osteoporosis    Past Surgical History:  Procedure Laterality Date   AUGMENTATION MAMMAPLASTY Bilateral 2003   CATARACT EXTRACTION W/PHACO Right 02/13/2019   Procedure: CATARACT EXTRACTION PHACO AND INTRAOCULAR LENS PLACEMENT (IOC) RIGHT 3.81 00:38.5 9.9%;  Surgeon: Lockie Mola, MD;  Location[facility];  Service: Ophthalmology;  Laterality: Right;   CATARACT EXTRACTION W/PHACO Left 04/10/2019   Procedure: CATARACT EXTRACTION PHACO AND INTRAOCULAR LENS PLACEMENT (IOC) LEFT 2.02 00:27.3 7.4%;  Surgeon: Lockie Mola, MD;  Location: [facility];  Service: Ophthalmology;  Laterality: Left;   COLONOSCOPY WITH PROPOFOL N/A 10/19/2018   Procedure: COLONOSCOPY WITH PROPOFOL;  Surgeon: Wyline Mood, MD;  Location: [facility];  Service: Gastroenterology;  Laterality: N/A;   EYE SURGERY  1999   Lasik   EYE SURGERY  2017   [facility]   Social History   Socioeconomic History   Marital status: Married    Spouse name: Thayer Ohm   Number of children: 2   Years of education: undergrad   Highest education level: Not on file  Occupational History   Occupation: process applications; 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    Employer: [employer]    Comment: Part time; seasonal  Tobacco Use   Smoking  status: Former    Packs/day: 0.50    Years: 15.00    Total pack years: 7.50    Types: Cigarettes    Quit date: 01/24/2004    Years since quitting: 17.6   Smokeless tobacco: Never  Vaping Use   Vaping Use: Never used  Substance and Sexual Activity   Alcohol use: No   Drug use: No   Sexual activity: Yes    Birth control/protection: Post-menopausal  Other Topics Concern   Not on file  Social History Narrative   Not on file   Social Determinants of Health   Financial Resource Strain: Not on file  Food Insecurity: Not on file  Transportation Needs: Not on file  Physical Activity: Not on file  Stress: Not on file  Social Connections: Not on file   No Known Allergies Family History  Problem Relation Age of Onset   Alzheimer's disease Mother    Heart attack Father    Hypertension Sister    Bronchitis Sister    Neurologic Disorder Sister        global transient amnesia x 2   Breast cancer Other          Current Outpatient Medications (Other):    busPIRone (BUSPAR) 7.5 MG tablet, Take 1 tablet (7.5 mg total) by mouth 2 (two) times daily.   citalopram (CELEXA) 20 MG tablet, Take 1.5 tablets (30 mg total) by mouth daily.   Reviewed prior external information including notes and imaging from  primary care provider As  well as notes that were available from care everywhere and other healthcare systems.  Past medical history, social, surgical and family history all reviewed in electronic medical record.  No pertanent information unless stated regarding to the chief complaint.   Review of Systems:  No headache, visual changes, nausea, vomiting, diarrhea, constipation, dizziness, abdominal pain, skin rash, fevers, chills, night sweats, weight loss, swollen lymph nodes, body aches, joint swelling, chest pain, shortness of breath, mood changes.   Objective  Blood pressure 128/86, pulse 68, height 5\' 8"  (1.727 m), weight 130 lb (59 kg), SpO2 98 %.   General: No apparent  distress alert and oriented x3 mood and affect normal, dressed appropriately.  HEENT: Pupils equal, extraocular movements intact  Respiratory: Patient's speak in full sentences and does not appear short of breath  Cardiovascular: No lower extremity edema, non tender, no erythema  Low back exam does have some tingling noted.  Seems to be though intermittently intact to palpation at the moment.  Patient does have good range of motion of the ankles.  Ambulates with a regular gait at the moment.  Able to get out of his seat without any significant difficulty. Deep tendon reflexes intact   Impression and Recommendations:    The above documentation has been reviewed and is accurate and complete , DO

## 2021-09-15 ENCOUNTER — Ambulatory Visit: Payer: Medicare Other | Admitting: Family Medicine

## 2021-09-15 ENCOUNTER — Encounter: Payer: Self-pay | Admitting: Family Medicine

## 2021-09-15 ENCOUNTER — Encounter: Payer: Self-pay | Admitting: Neurology

## 2021-09-15 VITALS — BP 128/86 | HR 68 | Ht 68.0 in | Wt 130.0 lb

## 2021-09-15 DIAGNOSIS — M2550 Pain in unspecified joint: Secondary | ICD-10-CM | POA: Diagnosis not present

## 2021-09-15 DIAGNOSIS — G629 Polyneuropathy, unspecified: Secondary | ICD-10-CM

## 2021-09-15 LAB — B12 AND FOLATE PANEL
Folate: 8 ng/mL (ref >5.9)
Vitamin B-12: 309 pg/mL (ref 211–911)

## 2021-09-15 LAB — COMPREHENSIVE METABOLIC PANEL
ALT: 13 U/L (ref 0–35)
AST: 19 U/L (ref 0–37)
Albumin: 4 g/dL (ref 3.5–5.2)
Alkaline Phosphatase: 89 U/L (ref 39–117)
BUN: 16 mg/dL (ref 6–23)
CO2: 29 mEq/L (ref 19–32)
Calcium: 8.6 mg/dL (ref 8.4–10.5)
Chloride: 102 mEq/L (ref 96–112)
Creatinine, Ser: 0.73 mg/dL (ref 0.40–1.20)
GFR: 84.68 mL/min (ref >60.00)
Glucose, Bld: 85 mg/dL (ref 70–99)
Potassium: 4.3 mEq/L (ref 3.5–5.1)
Sodium: 136 mEq/L (ref 135–145)
Total Bilirubin: 0.4 mg/dL (ref 0.2–1.2)
Total Protein: 6.4 g/dL (ref 6.0–8.3)

## 2021-09-15 LAB — CBC WITH DIFFERENTIAL/PLATELET
Basophils Absolute: 0.1 10*3/uL (ref 0.0–0.1)
Basophils Relative: 1.2 % (ref 0.0–3.0)
Eosinophils Absolute: 0 10*3/uL (ref 0.0–0.7)
Eosinophils Relative: 0.6 % (ref 0.0–5.0)
HCT: 36.4 % (ref 36.0–46.0)
Hemoglobin: 11.3 g/dL — ABNORMAL LOW (ref 12.0–15.0)
Lymphocytes Relative: 18 % (ref 12.0–46.0)
Lymphs Abs: 1.1 10*3/uL (ref 0.7–4.0)
MCHC: 31.2 g/dL (ref 30.0–36.0)
MCV: 74.3 fl — ABNORMAL LOW (ref 78.0–100.0)
Monocytes Absolute: 0.5 10*3/uL (ref 0.1–1.0)
Monocytes Relative: 8.9 % (ref 3.0–12.0)
Neutro Abs: 4.2 10*3/uL (ref 1.4–7.7)
Neutrophils Relative %: 71.3 % (ref 43.0–77.0)
Platelets: 239 10*3/uL (ref 150.0–400.0)
RBC: 4.9 Mil/uL (ref 3.87–5.11)
RDW: 18.2 % — ABNORMAL HIGH (ref 11.5–15.5)
WBC: 5.9 10*3/uL (ref 4.0–10.5)

## 2021-09-15 LAB — FERRITIN: Ferritin: 4.3 ng/mL — ABNORMAL LOW (ref 10.0–291.0)

## 2021-09-15 LAB — TSH: TSH: 1.57 u[IU]/mL (ref 0.35–5.50)

## 2021-09-15 LAB — IBC PANEL
Iron: 29 ug/dL — ABNORMAL LOW (ref 42–145)
Saturation Ratios: 6.1 % — ABNORMAL LOW (ref 20.0–50.0)
TIBC: 478.8 ug/dL — ABNORMAL HIGH (ref 250.0–450.0)
Transferrin: 342 mg/dL (ref 212.0–360.0)

## 2021-09-15 LAB — URIC ACID: Uric Acid, Serum: 3 mg/dL (ref 2.4–7.0)

## 2021-09-15 LAB — SEDIMENTATION RATE: Sed Rate: 9 mm/hr (ref 0–30)

## 2021-09-15 LAB — VITAMIN D 25 HYDROXY (VIT D DEFICIENCY, FRACTURES): VITD: 40.06 ng/mL (ref 30.00–100.00)

## 2021-09-15 NOTE — Assessment & Plan Note (Addendum)
Patient is having worsening neuropathy seems to be going more ascending at the moment.  Patient denies any significant weakness that is associated with it.  We discussed with patient at great length.  We discussed at this point I would like to consider the possibility of laboratory work-up as well as a nerve conduction study to clarify the peripheral neuropathy versus other possible in the differential.  Patient will follow-up with me after the imaging otherwise.

## 2021-09-15 NOTE — Patient Instructions (Addendum)
Referral for nerve study Labs today We will message with follow up information

## 2021-09-16 LAB — PTH, INTACT AND CALCIUM
Calcium: 8.9 mg/dL (ref 8.6–10.4)
PTH: 41 pg/mL (ref 16–77)

## 2021-09-16 LAB — ANA: Anti Nuclear Antibody (ANA): NEGATIVE

## 2021-09-17 ENCOUNTER — Other Ambulatory Visit: Payer: Self-pay

## 2021-09-17 DIAGNOSIS — M2550 Pain in unspecified joint: Secondary | ICD-10-CM

## 2021-09-20 ENCOUNTER — Ambulatory Visit: Payer: Medicare Other | Admitting: Neurology

## 2021-09-20 DIAGNOSIS — M2550 Pain in unspecified joint: Secondary | ICD-10-CM | POA: Diagnosis not present

## 2021-09-20 DIAGNOSIS — R202 Paresthesia of skin: Secondary | ICD-10-CM

## 2021-09-20 NOTE — Procedures (Signed)
[facility]  [address] [place], [address]  [place], [place] [address] Tel: [phone] Fax:  [phone] Test Date:  09/20/2021  Patient: Amber Hayes DOB: 23-Oct-1955 Physician: Jacquelyne Balint, MD  Sex: Female Height: 5\' 8"  Ref Phys: , DO  ID#: Antoine Primas   Technician:    Patient Complaints: This is a 66 year old female with tingling in bilateral toes.  NCV & EMG Findings: Extensive electrodiagnostic evaluation of the right lower extremity with additional needle examination of the left lower extremity shows: Right sural and superficial peroneal sensory responses are within normal limits. Right peroneal (recording at the extensor digitorum brevis) and tibial (recording at the abductor hallucis) are within normal limits. There are chronic motor axon loss changes without accompanying active denervation changes in the right tibialis anterior and flexor digitorum muscles. Comparison studies in the left lower extremity show no active or chronic motor axon loss changes affecting any of the tested muscles.  Impression: This extensive electrodiagnostic evaluation of the right lower extremity with additional needle examination of the left lower extremity shows no significant abnormalities. Specifically, it reveals: No electrodiagnostic evidence of a large fiber sensorimotor neuropathy. Of note, a pure small fiber neuropathy cannot be excluded based on this study. 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   ___________________________ 73, MD    Nerve Conduction Studies Anti Sensory Summary Table   Stim Site NR Peak (ms) Norm Peak (ms) P-T Amp (V) Norm O-P Amp  Right Sup Peroneal Anti Sensory (Ant Lat Mall)  32C  12 cm    2.9 <4.6 3.9 >3  Right Sural Anti Sensory (Lat Mall)  32C  Calf    3.8 <4.6 5.4 >3   Motor Summary Table   Stim Site NR Onset (ms) Norm Onset  (ms) O-P Amp (mV) Norm O-P Amp Site1 Site2 Delta-0 (ms) Dist (cm) Vel (m/s) Norm Vel (m/s)  Right Peroneal Motor (Ext Dig Brev)  32C  Ankle    5.2 <6.0 2.8 >2.5 B Fib Ankle 7.2 30.0 42 >40  B Fib    12.4  2.1  Poplt B Fib 2.4 10.0 42 >40  Poplt    14.8  2.0         Right Tibial Motor (Abd Hall Brev)  32C  Ankle    5.6 <6.0 4.3 >4 Knee Ankle 8.9 41.0 46 >40  Knee    14.5  2.6          H Reflex Studies   NR H-Lat (ms) Lat Norm (ms) L-R H-Lat (ms)  Right Tibial (Gastroc)  32C     35.37 <35    EMG   Side Muscle Ins Act Fibs Fasc Recrt Dur. Amp. Poly. Activation Comment  Right AntTibialis Nml Nml Nml 1- 1+ 1+ 1+ Nml N/A  Right Gastroc Nml Nml Nml Nml Nml Nml Nml Nml N/A  Right Flex Dig Long Nml Nml Nml 1- 1+ 1+ Nml Nml N/A  Right RectFemoris Nml Nml Nml Nml Nml Nml Nml Nml N/A  Right BicepsFemS Nml Nml Nml Nml Nml Nml Nml Nml N/A  Right GluteusMed Nml Nml Nml Nml Nml Nml Nml Nml N/A  Left AntTibialis Nml Nml Nml Nml Nml Nml Nml Nml N/A  Left Gastroc Nml Nml Nml Nml Nml Nml Nml Nml N/A      Waveforms:

## 2021-09-21 ENCOUNTER — Ambulatory Visit: Payer: Self-pay

## 2021-09-21 NOTE — Patient Outreach (Signed)
  Care Coordination   09/21/2021 Name: Amber Hayes MRN: [id number] DOB: 08-11-1955   Care Coordination Outreach Attempts:  An unsuccessful telephone outreach was attempted today to offer the patient information about available care coordination services as a benefit of their health plan.   Follow Up Plan:  Additional outreach attempts will be made to offer the patient care coordination information and services.   Encounter Outcome:  No Answer  Care Coordination Interventions Activated:  No   Care Coordination Interventions:  No, not indicated    Alto Denver RN, MSN, CCM Community Care Coordinator [facility]  [facility] Mobile: [phone]

## 2021-09-23 DIAGNOSIS — H35372 Puckering of macula, left eye: Secondary | ICD-10-CM | POA: Diagnosis not present

## 2021-09-28 ENCOUNTER — Ambulatory Visit
Admission: RE | Admit: 2021-09-28 | Discharge: 2021-09-28 | Disposition: A | Discharge status: Home / Self Care | Payer: Medicare Other | Source: Ambulatory Visit | Attending: Family Medicine | Admitting: Family Medicine

## 2021-09-28 DIAGNOSIS — Z1231 Encounter for screening mammogram for malignant neoplasm of breast: Secondary | ICD-10-CM | POA: Diagnosis not present

## 2021-09-29 ENCOUNTER — Encounter: Payer: Self-pay | Admitting: Family Medicine

## 2021-09-29 NOTE — Progress Notes (Signed)
Hi Cindi  Normal mammogram; repeat in 1 year.  Please let us know if you have any questions.  Thank you,  Merita Norton, FNP

## 2021-10-18 ENCOUNTER — Other Ambulatory Visit: Payer: Self-pay | Admitting: Family Medicine

## 2021-11-02 ENCOUNTER — Other Ambulatory Visit: Payer: Self-pay | Admitting: Family Medicine

## 2021-11-03 MED ORDER — CITALOPRAM HYDROBROMIDE 20 MG PO TABS: 30.0000 mg | ORAL_TABLET | Freq: Every day | ORAL | 0 refills | Status: DC

## 2021-11-04 NOTE — Progress Notes (Signed)
[facility] [place] Phone: [phone] Subjective:   Amber Hayes, am serving as a scribe for Dr. Hulan Saas.  I'm seeing this patient by the request  of:  Bacigalupo, Dionne Bucy, MD  CC: Neuropathy follow-up  MEQ:[id number] have low iron patient is having worsening neuropathy seems to be going more ascending at the moment.  Patient denies any significant weakness that is associated with it.  We discussed with patient at great length.  We discussed at this point I would like to consider the possibility of laboratory work-up as well as a nerve conduction study to clarify the peripheral neuropathy versus other possible in the differential.  Patient will follow-up with me after the imaging otherwise.  Update 11/09/2021 Amber Hayes is a 66 y.o. female coming in with complaint of neuropathy. Patient states that the tingling has lessened since last visit. Was also having cramping in calves and this subsided. Patient is taking the iron and B12.  Hayes side effects.      Past Medical History:  Diagnosis Date   Osteoporosis    Past Surgical History:  Procedure Laterality Date   AUGMENTATION MAMMAPLASTY Bilateral 2003   CATARACT EXTRACTION W/PHACO Right 02/13/2019   Procedure: CATARACT EXTRACTION PHACO AND INTRAOCULAR LENS PLACEMENT (IOC) RIGHT 3.81 00:38.5 9.9%;  Surgeon: Leandrew Koyanagi, MD;  Location: [place];  Service: Ophthalmology;  Laterality: Right;   CATARACT EXTRACTION W/PHACO Left 04/10/2019   Procedure: CATARACT EXTRACTION PHACO AND INTRAOCULAR LENS PLACEMENT (IOC) LEFT 2.02 00:27.3 7.4%;  Surgeon: Leandrew Koyanagi, MD;  Location: [place];  Service: Ophthalmology;  Laterality: Left;   COLONOSCOPY WITH PROPOFOL N/A 10/19/2018   Procedure: COLONOSCOPY WITH PROPOFOL;  Surgeon: Jonathon Bellows, MD;  Location: [facility];  Service: Gastroenterology;  Laterality: N/A;   EYE SURGERY  1999    Lasik   EYE SURGERY  2017   [facility]   Social History   Socioeconomic History   Marital status: Married    Spouse name: Gerald Stabs   Number of children: 2   Years of education: undergrad   Highest education level: Not on file  Occupational History   Occupation: process applications; 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; seasonal  Tobacco Use   Smoking status: Former    Packs/day: 0.50    Years: 15.00    Total pack years: 7.50    Types: Cigarettes    Quit date: 01/24/2004    Years since quitting: 17.8   Smokeless tobacco: Never  Vaping Use   Vaping Use: Never used  Substance and Sexual Activity   Alcohol use: Hayes   Drug use: Hayes   Sexual activity: Yes    Birth control/protection: Post-menopausal  Other Topics Concern   Not on file  Social History Narrative   Not on file   Social Determinants of Health   Financial Resource Strain: Not on file  Food Insecurity: Not on file  Transportation Needs: Not on file  Physical Activity: Not on file  Stress: Not on file  Social Connections: Not on file   Hayes Known Allergies Family History  Problem Relation Age of Onset   Alzheimer's disease Mother    Heart attack Father    Hypertension Sister    Bronchitis Sister    Neurologic Disorder Sister        global transient amnesia x 2   Breast cancer Other  Current Outpatient Medications (Other):    busPIRone (BUSPAR) 7.5 MG tablet, Take 1 tablet (7.5 mg total) by mouth 2 (two) times daily.   citalopram (CELEXA) 20 MG tablet, Take 1.5 tablets (30 mg total) by mouth daily.   Reviewed prior external information including notes and imaging from  primary care provider As well as notes that were available from care everywhere and other healthcare systems.  Past medical history, social, surgical and family history all reviewed in electronic medical record.  Hayes pertanent information unless stated regarding to the chief complaint.    Review of Systems:  Hayes headache, visual changes, nausea, vomiting, diarrhea, constipation, dizziness, abdominal pain, skin rash, fevers, chills, night sweats, weight loss, swollen lymph nodes, body aches, joint swelling, chest pain, shortness of breath, mood changes. POSITIVE muscle aches  Objective  Blood pressure (!) 142/92, pulse 75, height 5\' 8"  (1.727 m), weight 131 lb (59.4 kg), SpO2 99 %.   General: Hayes apparent distress alert and oriented x3 mood and affect normal, dressed appropriately.  HEENT: Pupils equal, extraocular movements intact  Respiratory: Patient's speak in full sentences and does not appear short of breath  Cardiovascular: Hayes lower extremity edema, non tender, Hayes erythema  Patient is neurologically intact distally.    Impression and Recommendations:    The above documentation has been reviewed and is accurate and complete , DO

## 2021-11-09 ENCOUNTER — Ambulatory Visit: Payer: Medicare Other | Admitting: Family Medicine

## 2021-11-09 ENCOUNTER — Encounter: Payer: Self-pay | Admitting: Family Medicine

## 2021-11-09 VITALS — BP 142/92 | HR 75 | Ht 68.0 in | Wt 131.0 lb

## 2021-11-09 DIAGNOSIS — M2550 Pain in unspecified joint: Secondary | ICD-10-CM | POA: Diagnosis not present

## 2021-11-09 DIAGNOSIS — G629 Polyneuropathy, unspecified: Secondary | ICD-10-CM

## 2021-11-09 LAB — CBC WITH DIFFERENTIAL/PLATELET
Basophils Absolute: 0 10*3/uL (ref 0.0–0.1)
Basophils Relative: 0.7 % (ref 0.0–3.0)
Eosinophils Absolute: 0 10*3/uL (ref 0.0–0.7)
Eosinophils Relative: 0.5 % (ref 0.0–5.0)
HCT: 41.7 % (ref 36.0–46.0)
Hemoglobin: 13.4 g/dL (ref 12.0–15.0)
Lymphocytes Relative: 18.9 % (ref 12.0–46.0)
Lymphs Abs: 1.3 10*3/uL (ref 0.7–4.0)
MCHC: 32.1 g/dL (ref 30.0–36.0)
MCV: 84.6 fl (ref 78.0–100.0)
Monocytes Absolute: 0.5 10*3/uL (ref 0.1–1.0)
Monocytes Relative: 7.3 % (ref 3.0–12.0)
Neutro Abs: 4.9 10*3/uL (ref 1.4–7.7)
Neutrophils Relative %: 72.6 % (ref 43.0–77.0)
Platelets: 181 10*3/uL (ref 150.0–400.0)
RBC: 4.93 Mil/uL (ref 3.87–5.11)
RDW: 23.3 % — ABNORMAL HIGH (ref 11.5–15.5)
WBC: 6.7 10*3/uL (ref 4.0–10.5)

## 2021-11-09 LAB — IBC PANEL
Iron: 197 ug/dL — ABNORMAL HIGH (ref 42–145)
Saturation Ratios: 62.3 % — ABNORMAL HIGH (ref 20.0–50.0)
TIBC: 316.4 ug/dL (ref 250.0–450.0)
Transferrin: 226 mg/dL (ref 212.0–360.0)

## 2021-11-09 LAB — FERRITIN: Ferritin: 15.3 ng/mL (ref 10.0–291.0)

## 2021-11-09 LAB — VITAMIN B12: Vitamin B-12: 630 pg/mL (ref 211–911)

## 2021-11-09 NOTE — Patient Instructions (Addendum)
Labs today We can re check in 3 months closer to your house  Keep me updated if anything changes As of right now no follow up

## 2021-11-09 NOTE — Assessment & Plan Note (Signed)
Patient was found to have low iron and low vitamin B-12.  We will recheck to see how patient is progressing.  Did have improvement previously and hopefully we will continue to see that.  If we do notice that the symptoms and patient's lab test showed continued improvement we will continue to monitor.  Follow-up again in 6 to 8 weeks otherwise.

## 2021-11-19 ENCOUNTER — Ambulatory Visit: Payer: Medicare Other | Admitting: Neurology

## 2021-11-23 ENCOUNTER — Encounter: Payer: Medicare Other | Admitting: Neurology

## 2022-01-10 ENCOUNTER — Ambulatory Visit (INDEPENDENT_AMBULATORY_CARE_PROVIDER_SITE_OTHER): Payer: Medicare Other | Admitting: Family Medicine

## 2022-01-10 ENCOUNTER — Encounter: Payer: Self-pay | Admitting: Family Medicine

## 2022-01-10 VITALS — BP 137/80 | HR 69 | Temp 98.0°F | Resp 16 | Ht 68.0 in | Wt 134.8 lb

## 2022-01-10 DIAGNOSIS — E7800 Pure hypercholesterolemia, unspecified: Secondary | ICD-10-CM | POA: Diagnosis not present

## 2022-01-10 DIAGNOSIS — Z0000 Encounter for general adult medical examination without abnormal findings: Secondary | ICD-10-CM

## 2022-01-10 DIAGNOSIS — M810 Age-related osteoporosis without current pathological fracture: Secondary | ICD-10-CM

## 2022-01-10 MED ORDER — CITALOPRAM HYDROBROMIDE 20 MG PO TABS: 30.0000 mg | ORAL_TABLET | Freq: Every day | ORAL | 3 refills | Status: DC

## 2022-01-10 MED ORDER — BUSPIRONE HCL 7.5 MG PO TABS: 7.5000 mg | ORAL_TABLET | Freq: Two times a day (BID) | ORAL | 3 refills | Status: DC

## 2022-01-10 NOTE — Assessment & Plan Note (Signed)
Repeat DEXA Not on treatment - considering prolia Continue Vit D supplement

## 2022-01-10 NOTE — Assessment & Plan Note (Signed)
Reviewed last lipid panel Not currently on a statin Recheck FLP and CMP Discussed diet and exercise  

## 2022-01-10 NOTE — Progress Notes (Signed)
I,Sulibeya S Dimas,acting as a Education administrator for Lavon Paganini, MD.,have documented all relevant documentation on the behalf of Lavon Paganini, MD,as directed by  Lavon Paganini, MD while in the presence of Lavon Paganini, MD.    Annual Wellness Visit     Patient: Amber Hayes, Female    DOB: Oct 15, 1955, 66 y.o.   MRN: [id number] Visit Date: 01/10/2022  Today's Provider: Lavon Paganini, MD   Chief Complaint  Patient presents with   Medicare Wellness   Subjective    Amber Hayes is a 66 y.o. female who presents today for her Annual Wellness Visit. She reports consuming a general diet. Home exercise routine includes walking. She generally feels well. She reports sleeping well. She does not have additional problems to discuss today.   HPI Patient declined flu vaccine.   She is planning on shingrix at the pharmacy   Medications: Outpatient Medications Prior to Visit  Medication Sig   [DISCONTINUED] busPIRone (BUSPAR) 7.5 MG tablet Take 1 tablet (7.5 mg total) by mouth 2 (two) times daily.   [DISCONTINUED] citalopram (CELEXA) 20 MG tablet Take 1.5 tablets (30 mg total) by mouth daily.   No facility-administered medications prior to visit.    No Known Allergies  Patient Care Team: Virginia Crews, MD as PCP - General (Family Medicine)  Review of Systems  Musculoskeletal:  Positive for neck pain.  Neurological:  Positive for headaches.  All other systems reviewed and are negative.   Last CBC Lab Results  Component Value Date   WBC 6.7 11/09/2021   HGB 13.4 11/09/2021   HCT 41.7 11/09/2021   MCV 84.6 11/09/2021   MCH 25.3 (L) 11/13/2018   RDW 23.3 (H) 11/09/2021   PLT 181.0 37/10/6267   Last metabolic panel Lab Results  Component Value Date   GLUCOSE 85 09/15/2021   NA 136 09/15/2021   K 4.3 09/15/2021   CL 102 09/15/2021   CO2 29 09/15/2021   BUN 16 09/15/2021   CREATININE 0.73 09/15/2021   EGFR 78 01/14/2021   CALCIUM 8.9 09/15/2021    CALCIUM 8.6 09/15/2021   PROT 6.4 09/15/2021   ALBUMIN 4.0 09/15/2021   LABGLOB 2.1 01/14/2021   AGRATIO 2.1 01/14/2021   BILITOT 0.4 09/15/2021   ALKPHOS 89 09/15/2021   AST 19 09/15/2021   ALT 13 09/15/2021   Last lipids Lab Results  Component Value Date   CHOL 211 (H) 01/14/2021   HDL 71 01/14/2021   LDLCALC 129 (H) 01/14/2021   TRIG 62 01/14/2021   CHOLHDL 3.0 01/14/2021   Last hemoglobin A1c Lab Results  Component Value Date   HGBA1C 5.4 07/05/2016   Last thyroid functions Lab Results  Component Value Date   TSH 1.57 09/15/2021   Last vitamin D Lab Results  Component Value Date   VD25OH 40.06 09/15/2021   Last vitamin B12 and Folate Lab Results  Component Value Date   VITAMINB12 630 11/09/2021   FOLATE 8.0 09/15/2021        Objective    Vitals: BP 137/80 (BP Location: Left Arm, Patient Position: Sitting, Cuff Size: Large)   Pulse 69   Temp 98 F (36.7 C) (Oral)   Resp 16   Ht _0  (1.727 m)   Wt 134 lb 12.8 oz (61.1 kg)   BMI 20.50 kg/m  BP Readings from Last 3 Encounters:  01/10/22 137/80  11/09/21 (!) 142/92  09/15/21 128/86   Wt Readings from Last 3 Encounters:  01/10/22 134 lb 12.8 oz (61.1 kg)  11/09/21 131 lb (59.4 kg)  09/15/21 130 lb (59 kg)       Physical Exam Vitals reviewed.  Constitutional:      General: She is not in acute distress.    Appearance: Normal appearance. She is well-developed. She is not diaphoretic.  HENT:     Head: Normocephalic and atraumatic.     Right Ear: Tympanic membrane, ear canal and external ear normal.     Left Ear: Tympanic membrane, ear canal and external ear normal.     Nose: Nose normal.     Mouth/Throat:     Mouth: Mucous membranes are moist.     Pharynx: Oropharynx is clear. No oropharyngeal exudate.  Eyes:     General: No scleral icterus.    Conjunctiva/sclera: Conjunctivae normal.     Pupils: Pupils are equal, round, and reactive to light.  Neck:     Thyroid: No thyromegaly.   Cardiovascular:     Rate and Rhythm: Normal rate and regular rhythm.     Pulses: Normal pulses.     Heart sounds: Normal heart sounds. No murmur heard. Pulmonary:     Effort: Pulmonary effort is normal. No respiratory distress.     Breath sounds: Normal breath sounds. No wheezing or rales.  Abdominal:     General: There is no distension.     Palpations: Abdomen is soft.     Tenderness: There is no abdominal tenderness.  Musculoskeletal:        General: No deformity.     Cervical back: Neck supple.     Right lower leg: No edema.     Left lower leg: No edema.  Lymphadenopathy:     Cervical: No cervical adenopathy.  Skin:    General: Skin is warm and dry.     Findings: No rash.  Neurological:     Mental Status: She is alert and oriented to person, place, and time. Mental status is at baseline.     Sensory: No sensory deficit.     Motor: No weakness.     Gait: Gait normal.  Psychiatric:        Mood and Affect: Mood normal.        Behavior: Behavior normal.        Thought Content: Thought content normal.      Most recent functional status assessment:    01/06/2022   10:47 AM  In your present state of health, do you have any difficulty performing the following activities:  Hearing? 0  Vision? 0  Difficulty concentrating or making decisions? 0  Walking or climbing stairs? 0  Dressing or bathing? 0  Doing errands, shopping? 0  Preparing Food and eating ? N  Using the Toilet? N  In the past six months, have you accidently leaked urine? Y  Do you have problems with loss of bowel control? N  Managing your Medications? N  Managing your Finances? N  Housekeeping or managing your Housekeeping? N   Most recent fall risk assessment:    01/06/2022   10:47 AM  Fall Risk   Falls in the past year? 1  Number falls in past yr: 0  Injury with Fall? 0    Most recent depression screenings:    01/10/2022    3:25 PM 04/30/2021   11:09 AM  PHQ 2/9 Scores  PHQ - 2 Score 0 0   PHQ- 9 Score 1 1   Most recent cognitive screening:    01/10/2022    3:26 PM  6CIT Screen  What Year? 0 points  What month? 0 points  What time? 0 points  Count back from 20 0 points  Months in reverse 0 points  Repeat phrase 0 points  Total Score 0 points   Most recent Audit-C alcohol use screening    01/06/2022   10:47 AM  Alcohol Use Disorder Test (AUDIT)  1. How often do you have a drink containing alcohol? 0  3. How often do you have six or more drinks on one occasion? 0   A score of 3 or more in women, and 4 or more in men indicates increased risk for alcohol abuse, EXCEPT if all of the points are from question 1   No results found for any visits on 01/10/22.  Assessment & Plan     Annual wellness visit done today including the all of the following: Reviewed patient's Family Medical History Reviewed and updated list of patient's medical providers Assessment of cognitive impairment was done Assessed patient's functional ability Established a written schedule for health screening [place] Completed and Reviewed  Exercise Activities and Dietary recommendations  Goals   None     Immunization History  Administered Date(s) Administered   PFIZER(Purple Top)SARS-COV-2 Vaccination 03/29/2019, 04/18/2019, 01/05/2020   Pneumococcal Conjugate-13 12/25/2019   Pneumococcal Polysaccharide-23 11/08/2018   Tdap 07/13/2013    Health Maintenance  Topic Date Due   Zoster Vaccines- Shingrix (1 of 2) Never done   DEXA SCAN  12/03/2020   COVID-19 Vaccine (4 - 2023-24 season) 09/24/2021   INFLUENZA VACCINE  04/24/2022 (Originally 08/24/2021)   Medicare Annual Wellness (AWV)  01/11/2023   DTaP/Tdap/Td (2 - Td or Tdap) 07/14/2023   MAMMOGRAM  09/29/2023   COLONOSCOPY (Pts 45-77yr Insurance coverage will need to be confirmed)  10/18/2028   Pneumonia Vaccine 66 Years old  Completed   Hepatitis C Screening  Completed   HPV VACCINES  Aged Out      Discussed health benefits of physical activity, and encouraged her to engage in regular exercise appropriate for her age and condition.    Problem List Items Addressed This Visit       Musculoskeletal and Integument   Age-related osteoporosis without current pathological fracture    Repeat DEXA Not on treatment - considering prolia Continue Vit D supplement      Relevant Orders   DG Bone Density     Other   Pure hypercholesterolemia    Reviewed last lipid panel Not currently on a statin Recheck FLP and CMP Discussed diet and exercise       Relevant Orders   Comprehensive metabolic panel   Lipid panel   Other Visit Diagnoses     Medicare annual wellness visit, initial    -  Primary   Encounter for annual physical exam       Relevant Orders   Comprehensive metabolic panel   Lipid panel        Return in about 1 year (around 01/11/2023) for CPE.     I, ALavon Paganini MD, have reviewed all documentation for this visit. The documentation on 01/10/22 for the exam, diagnosis, procedures, and orders are all accurate and complete.   Abrahan Fulmore, ADionne Bucy MD, MPH [facility]

## 2022-01-10 NOTE — Patient Instructions (Signed)
Recommend starting OTC Vit D3 1000-2000 units daily

## 2022-01-13 DIAGNOSIS — E7800 Pure hypercholesterolemia, unspecified: Secondary | ICD-10-CM | POA: Diagnosis not present

## 2022-01-13 DIAGNOSIS — Z0000 Encounter for general adult medical examination without abnormal findings: Secondary | ICD-10-CM | POA: Diagnosis not present

## 2022-01-14 LAB — LIPID PANEL
Chol/HDL Ratio: 2.7 ratio (ref 0.0–4.4)
Cholesterol, Total: 195 mg/dL (ref 100–199)
HDL: 72 mg/dL (ref >39)
LDL Chol Calc (NIH): 113 mg/dL — ABNORMAL HIGH (ref 0–99)
Triglycerides: 54 mg/dL (ref 0–149)
VLDL Cholesterol Cal: 10 mg/dL (ref 5–40)

## 2022-01-14 LAB — COMPREHENSIVE METABOLIC PANEL WITH GFR
ALT: 20 IU/L (ref 0–32)
AST: 21 IU/L (ref 0–40)
Albumin/Globulin Ratio: 2.2 (ref 1.2–2.2)
Albumin: 4.2 g/dL (ref 3.9–4.9)
Alkaline Phosphatase: 112 IU/L (ref 44–121)
BUN/Creatinine Ratio: 16 (ref 12–28)
BUN: 14 mg/dL (ref 8–27)
Bilirubin Total: 0.4 mg/dL (ref 0.0–1.2)
CO2: 27 mmol/L (ref 20–29)
Calcium: 9.1 mg/dL (ref 8.7–10.3)
Chloride: 102 mmol/L (ref 96–106)
Creatinine, Ser: 0.85 mg/dL (ref 0.57–1.00)
Globulin, Total: 1.9 g/dL (ref 1.5–4.5)
Glucose: 77 mg/dL (ref 70–99)
Potassium: 4.2 mmol/L (ref 3.5–5.2)
Sodium: 142 mmol/L (ref 134–144)
Total Protein: 6.1 g/dL (ref 6.0–8.5)
eGFR: 75 mL/min/1.73

## 2022-01-27 DIAGNOSIS — K080 Exfoliation of teeth due to systemic causes: Secondary | ICD-10-CM | POA: Diagnosis not present

## 2022-02-16 ENCOUNTER — Ambulatory Visit
Admission: RE | Admit: 2022-02-16 | Discharge: 2022-02-16 | Disposition: A | Discharge status: Home / Self Care | Payer: Medicare Other | Source: Ambulatory Visit | Attending: Family Medicine | Admitting: Family Medicine

## 2022-02-16 DIAGNOSIS — M810 Age-related osteoporosis without current pathological fracture: Secondary | ICD-10-CM | POA: Diagnosis not present

## 2022-02-17 ENCOUNTER — Telehealth: Payer: Self-pay

## 2022-02-17 DIAGNOSIS — M810 Age-related osteoporosis without current pathological fracture: Secondary | ICD-10-CM

## 2022-02-17 DIAGNOSIS — H43813 Vitreous degeneration, bilateral: Secondary | ICD-10-CM | POA: Diagnosis not present

## 2022-02-17 DIAGNOSIS — H35372 Puckering of macula, left eye: Secondary | ICD-10-CM | POA: Diagnosis not present

## 2022-02-17 NOTE — Telephone Encounter (Signed)
Patient agreed on endocrinology referral.

## 2022-02-17 NOTE — Telephone Encounter (Signed)
-----  Message from Virginia Crews, MD sent at 02/17/2022  8:25 AM EST ----- Bone density (osteoporosis) is worse since last checked. Does patient want referral to Endocrinology to discuss Prolia injections? Ok to place order if she does.

## 2022-02-28 DIAGNOSIS — M810 Age-related osteoporosis without current pathological fracture: Secondary | ICD-10-CM | POA: Diagnosis not present

## 2022-03-10 DIAGNOSIS — M810 Age-related osteoporosis without current pathological fracture: Secondary | ICD-10-CM | POA: Diagnosis not present

## 2022-04-06 NOTE — Progress Notes (Signed)
[place] Phone: [phone] Subjective:   Fontaine No, am serving as a scribe for Dr. Hulan Saas.  I'm seeing this patient by the request  of:  Virginia Crews, MD  CC: low back exam   [id number]  11/09/2021 Patient was found to have low iron and low vitamin B-12. We will recheck to see how patient is progressing. Did have improvement previously and hopefully we will continue to see that. If we do notice that the symptoms and patient's lab test showed continued improvement we will continue to monitor. Follow-up again in 6 to 8 weeks otherwise   Updated 04/07/2022 Shirlean Rathjen is a 67 y.o. female coming in with complaint of LBP. She has been doing PT, seeing chiropractor and getting massages. Pain is constant and feels like a pressure surrounding the lumbar spine. Unable to get long lasting relief. Would like an MRI.    Ferritin- 4-->15.3 B12-->309-->630    Past Medical History:  Diagnosis Date   Osteoporosis    Past Surgical History:  Procedure Laterality Date   AUGMENTATION MAMMAPLASTY Bilateral 2003   CATARACT EXTRACTION W/PHACO Right 02/13/2019   Procedure: CATARACT EXTRACTION PHACO AND INTRAOCULAR LENS PLACEMENT (IOC) RIGHT 3.81 00:38.5 9.9%;  Surgeon: Leandrew Koyanagi, MD;  Location: [place];  Service: Ophthalmology;  Laterality: Right;   CATARACT EXTRACTION W/PHACO Left 04/10/2019   Procedure: CATARACT EXTRACTION PHACO AND INTRAOCULAR LENS PLACEMENT (IOC) LEFT 2.02 00:27.3 7.4%;  Surgeon: Leandrew Koyanagi, MD;  Location: [place];  Service: Ophthalmology;  Laterality: Left;   COLONOSCOPY WITH PROPOFOL N/A 10/19/2018   Procedure: COLONOSCOPY WITH PROPOFOL;  Surgeon: Jonathon Bellows, MD;  Location: [facility];  Service: Gastroenterology;  Laterality: N/A;   EYE SURGERY  1999   Lasik   EYE SURGERY  2017   [facility]   Social History   Socioeconomic  History   Marital status: Married    Spouse name: Gerald Stabs   Number of children: 2   Years of education: undergrad   Highest education level: Not on file  Occupational History   Occupation: process applications; 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    Employer: [employer]: Part time; seasonal  Tobacco Use   Smoking status: Former    Packs/day: 0.50    Years: 15.00    Additional pack years: 0.00    Total pack years: 7.50    Types: Cigarettes    Quit date: 01/24/2004    Years since quitting: 18.2   Smokeless tobacco: Never  Vaping Use   Vaping Use: Never used  Substance and Sexual Activity   Alcohol use: No   Drug use: No   Sexual activity: Yes    Birth control/protection: Post-menopausal  Other Topics Concern   Not on file  Social History Narrative   Not on file   Social Determinants of Health   Financial Resource Strain: Not on file  Food Insecurity: Not on file  Transportation Needs: Not on file  Physical Activity: Not on file  Stress: Not on file  Social Connections: Not on file   No Known Allergies Family History  Problem Relation Age of Onset   Alzheimer's disease Mother    Heart attack Father    Hypertension Sister    Bronchitis Sister    Neurologic Disorder Sister        global transient amnesia x 2   Breast cancer Other  Current Outpatient Medications (Other):    busPIRone (BUSPAR) 7.5 MG tablet, Take 1 tablet (7.5 mg total) by mouth 2 (two) times daily.   citalopram (CELEXA) 20 MG tablet, Take 1.5 tablets (30 mg total) by mouth daily.   Reviewed prior external information including notes and imaging from  primary care provider As well as notes that were available from care everywhere and other healthcare systems.  Past medical history, social, surgical and family history all reviewed in electronic medical record.  No pertanent information unless stated regarding to the chief complaint.   Review of Systems:  No headache, visual  changes, nausea, vomiting, diarrhea, constipation, dizziness, abdominal pain, skin rash, fevers, chills, night sweats, weight loss, swollen lymph nodes, body aches, joint swelling, chest pain, shortness of breath, mood changes. POSITIVE muscle aches, body aches  Objective  Blood pressure 122/84, pulse (!) 54, height 5\' 8"  (1.727 m), weight 132 lb (59.9 kg), SpO2 99 %.   General: No apparent distress alert and oriented x3 mood and affect normal, dressed appropriately.  HEENT: Pupils equal, extraocular movements intact  Respiratory: Patient's speak in full sentences and does not appear short of breath  Cardiovascular: No lower extremity edema, non tender, no erythema  Low back exam shows in the mid spine that is fairly severe.Patient does have some lack of motion.  Positive straight leg test on the right side.  Tender to palpation more tender in the lumbar area.     Impression and Recommendations:     The above documentation has been reviewed and is accurate and complete Lyndal Pulley, DO

## 2022-04-07 ENCOUNTER — Ambulatory Visit: Payer: Medicare Other | Admitting: Family Medicine

## 2022-04-07 ENCOUNTER — Ambulatory Visit (INDEPENDENT_AMBULATORY_CARE_PROVIDER_SITE_OTHER): Payer: Medicare Other

## 2022-04-07 VITALS — BP 122/84 | HR 54 | Ht 68.0 in | Wt 132.0 lb

## 2022-04-07 DIAGNOSIS — M2550 Pain in unspecified joint: Secondary | ICD-10-CM

## 2022-04-07 DIAGNOSIS — M79604 Pain in right leg: Secondary | ICD-10-CM | POA: Diagnosis not present

## 2022-04-07 DIAGNOSIS — M545 Low back pain, unspecified: Secondary | ICD-10-CM

## 2022-04-07 DIAGNOSIS — M8588 Other specified disorders of bone density and structure, other site: Secondary | ICD-10-CM | POA: Diagnosis not present

## 2022-04-07 NOTE — Patient Instructions (Addendum)
Xray today MRI lumbar [id number] We will be in touch

## 2022-04-08 ENCOUNTER — Encounter: Payer: Self-pay | Admitting: Family Medicine

## 2022-04-08 DIAGNOSIS — M79604 Pain in right leg: Secondary | ICD-10-CM | POA: Insufficient documentation

## 2022-04-08 NOTE — Assessment & Plan Note (Signed)
Patient is having lumbar spine pain with radiation down mostly the right leg.  Tightness with straight leg test.  Discussed with patient about icing regimen and home exercises, discussed which activities to do and which ones to avoid, increase activity slowly.  Due to to patient having the radicular symptoms in the osteoporosis concern for the possibility of occult fracture as well as the past medical history for breast cancer need to rule out any bony pathology or compression fracture that could be can returning.  Will get MRI of the lumbar spine secondary to the acuity and the midline tenderness.  Patient will follow-up after imaging and we will discuss further treatment options thereafter.

## 2022-04-14 ENCOUNTER — Ambulatory Visit
Admission: RE | Admit: 2022-04-14 | Discharge: 2022-04-14 | Disposition: A | Discharge status: Home / Self Care | Payer: Medicare Other | Source: Ambulatory Visit | Attending: Family Medicine | Admitting: Family Medicine

## 2022-04-14 DIAGNOSIS — M5126 Other intervertebral disc displacement, lumbar region: Secondary | ICD-10-CM | POA: Diagnosis not present

## 2022-04-14 DIAGNOSIS — M545 Low back pain, unspecified: Secondary | ICD-10-CM

## 2022-04-18 ENCOUNTER — Encounter: Payer: Self-pay | Admitting: Family Medicine

## 2022-04-26 ENCOUNTER — Encounter: Payer: Self-pay | Admitting: Family Medicine

## 2022-04-27 ENCOUNTER — Encounter: Payer: Self-pay | Admitting: Family Medicine

## 2022-04-28 ENCOUNTER — Ambulatory Visit: Payer: Medicare Other | Admitting: Family Medicine

## 2022-05-18 ENCOUNTER — Encounter: Payer: Self-pay | Admitting: Family Medicine

## 2022-05-24 ENCOUNTER — Encounter: Payer: Self-pay | Admitting: Family Medicine

## 2022-06-09 DIAGNOSIS — K080 Exfoliation of teeth due to systemic causes: Secondary | ICD-10-CM | POA: Diagnosis not present

## 2022-06-14 ENCOUNTER — Encounter: Payer: Self-pay | Admitting: Family Medicine

## 2022-06-15 DIAGNOSIS — K080 Exfoliation of teeth due to systemic causes: Secondary | ICD-10-CM | POA: Diagnosis not present

## 2022-07-12 ENCOUNTER — Encounter: Payer: Self-pay | Admitting: Family Medicine

## 2022-08-18 DIAGNOSIS — K080 Exfoliation of teeth due to systemic causes: Secondary | ICD-10-CM | POA: Diagnosis not present

## 2022-09-07 ENCOUNTER — Ambulatory Visit (INDEPENDENT_AMBULATORY_CARE_PROVIDER_SITE_OTHER): Payer: Medicare Other

## 2022-09-07 VITALS — Ht 68.0 in | Wt 127.0 lb

## 2022-09-07 DIAGNOSIS — Z0000 Encounter for general adult medical examination without abnormal findings: Secondary | ICD-10-CM | POA: Diagnosis not present

## 2022-09-07 NOTE — Patient Instructions (Addendum)
Amber Hayes , Thank you for taking time to come for your Medicare Wellness Visit. I appreciate your ongoing commitment to your health goals. Please review the following plan we discussed and let me know if I can assist you in the future.   Referrals/Orders/Follow-Ups/Clinician Recommendations: none  This is a list of the screening recommended for you and due dates:  Health Maintenance  Topic Date Due   COVID-19 Vaccine (4 - 2023-24 season) 09/24/2021   Flu Shot  08/25/2022   DTaP/Tdap/Td vaccine (2 - Td or Tdap) 07/14/2023   Medicare Annual Wellness Visit  09/07/2023   Mammogram  09/29/2023   DEXA scan (bone density measurement)  02/17/2024   Colon Cancer Screening  10/18/2028   Pneumonia Vaccine  Completed   Hepatitis C Screening  Completed   Zoster (Shingles) Vaccine  Completed   HPV Vaccine  Aged Out    Advanced directives: (Copy Requested) Please bring a copy of your health care power of attorney and living will to the office to be added to your chart at your convenience.  Next Medicare Annual Wellness Visit scheduled for next year: Yes 09/11/23 @ 8:45am telephone  Preventive Care 65 Years and Older, Female Preventive care refers to lifestyle choices and visits with your health care provider that can promote health and wellness. What does preventive care include? A yearly physical exam. This is also called an annual well check. Dental exams once or twice a year. Routine eye exams. Ask your health care provider how often you should have your eyes checked. Personal lifestyle choices, including: Daily care of your teeth and gums. Regular physical activity. Eating a healthy diet. Avoiding tobacco and drug use. Limiting alcohol use. Practicing safe sex. Taking low-dose aspirin every day. Taking vitamin and mineral supplements as recommended by your health care provider. What happens during an annual well check? The services and screenings done by your health care provider during  your annual well check will depend on your age, overall health, lifestyle risk factors, and family history of disease. Counseling  Your health care provider may ask you questions about your: Alcohol use. Tobacco use. Drug use. Emotional well-being. Home and relationship well-being. Sexual activity. Eating habits. History of falls. Memory and ability to understand (cognition). Work and work Astronomer. Reproductive health. Screening  You may have the following tests or measurements: Height, weight, and BMI. Blood pressure. Lipid and cholesterol levels. These may be checked every 5 years, or more frequently if you are over 18 years old. Skin check. Lung cancer screening. You may have this screening every year starting at age 96 if you have a 30-pack-year history of smoking and currently smoke or have quit within the past 15 years. Fecal occult blood test (FOBT) of the stool. You may have this test every year starting at age 104. Flexible sigmoidoscopy or colonoscopy. You may have a sigmoidoscopy every 5 years or a colonoscopy every 10 years starting at age 69. Hepatitis C blood test. Hepatitis B blood test. Sexually transmitted disease (STD) testing. Diabetes screening. This is done by checking your blood sugar (glucose) after you have not eaten for a while (fasting). You may have this done every 1-3 years. Bone density scan. This is done to screen for osteoporosis. You may have this done starting at age 75. Mammogram. This may be done every 1-2 years. Talk to your health care provider about how often you should have regular mammograms. Talk with your health care provider about your test results, treatment options, and  if necessary, the need for more tests. Vaccines  Your health care provider may recommend certain vaccines, such as: Influenza vaccine. This is recommended every year. Tetanus, diphtheria, and acellular pertussis (Tdap, Td) vaccine. You may need a Td booster every 10  years. Zoster vaccine. You may need this after age 43. Pneumococcal 13-valent conjugate (PCV13) vaccine. One dose is recommended after age 39. Pneumococcal polysaccharide (PPSV23) vaccine. One dose is recommended after age 60. Talk to your health care provider about which screenings and vaccines you need and how often you need them. This information is not intended to replace advice given to you by your health care provider. Make sure you discuss any questions you have with your health care provider. Document Released: 02/06/2015 Document Revised: 09/30/2015 Document Reviewed: 11/11/2014 Elsevier Interactive Patient Education  2017 [employer].  Fall Prevention in the Home Falls can cause injuries. They can happen to people of all ages. There are many things you can do to make your home safe and to help prevent falls. What can I do on the outside of my home? Regularly fix the edges of walkways and driveways and fix any cracks. Remove anything that might make you trip as you walk through a door, such as a raised step or threshold. Trim any bushes or trees on the path to your home. Use bright outdoor lighting. Clear any walking paths of anything that might make someone trip, such as rocks or tools. Regularly check to see if handrails are loose or broken. Make sure that both sides of any steps have handrails. Any raised decks and porches should have guardrails on the edges. Have any leaves, snow, or ice cleared regularly. Use sand or salt on walking paths during winter. Clean up any spills in your garage right away. This includes oil or grease spills. What can I do in the bathroom? Use night lights. Install grab bars by the toilet and in the tub and shower. Do not use towel bars as grab bars. Use non-skid mats or decals in the tub or shower. If you need to sit down in the shower, use a plastic, non-slip stool. Keep the floor dry. Clean up any water that spills on the floor as soon as it  happens. Remove soap buildup in the tub or shower regularly. Attach bath mats securely with double-sided non-slip rug tape. Do not have throw rugs and other things on the floor that can make you trip. What can I do in the bedroom? Use night lights. Make sure that you have a light by your bed that is easy to reach. Do not use any sheets or blankets that are too big for your bed. They should not hang down onto the floor. Have a firm chair that has side arms. You can use this for support while you get dressed. Do not have throw rugs and other things on the floor that can make you trip. What can I do in the kitchen? Clean up any spills right away. Avoid walking on wet floors. Keep items that you use a lot in easy-to-reach places. If you need to reach something above you, use a strong step stool that has a grab bar. Keep electrical cords out of the way. Do not use floor polish or wax that makes floors slippery. If you must use wax, use non-skid floor wax. Do not have throw rugs and other things on the floor that can make you trip. What can I do with my stairs? Do not leave any  items on the stairs. Make sure that there are handrails on both sides of the stairs and use them. Fix handrails that are broken or loose. Make sure that handrails are as long as the stairways. Check any carpeting to make sure that it is firmly attached to the stairs. Fix any carpet that is loose or worn. Avoid having throw rugs at the top or bottom of the stairs. If you do have throw rugs, attach them to the floor with carpet tape. Make sure that you have a light switch at the top of the stairs and the bottom of the stairs. If you do not have them, ask someone to add them for you. What else can I do to help prevent falls? Wear shoes that: Do not have high heels. Have rubber bottoms. Are comfortable and fit you well. Are closed at the toe. Do not wear sandals. If you use a stepladder: Make sure that it is fully opened.  Do not climb a closed stepladder. Make sure that both sides of the stepladder are locked into place. Ask someone to hold it for you, if possible. Clearly mark and make sure that you can see: Any grab bars or handrails. First and last steps. Where the edge of each step is. Use tools that help you move around (mobility aids) if they are needed. These include: Canes. Walkers. Scooters. Crutches. Turn on the lights when you go into a dark area. Replace any light bulbs as soon as they burn out. Set up your furniture so you have a clear path. Avoid moving your furniture around. If any of your floors are uneven, fix them. If there are any pets around you, be aware of where they are. Review your medicines with your doctor. Some medicines can make you feel dizzy. This can increase your chance of falling. Ask your doctor what other things that you can do to help prevent falls. This information is not intended to replace advice given to you by your health care provider. Make sure you discuss any questions you have with your health care provider. Document Released: 11/06/2008 Document Revised: 06/18/2015 Document Reviewed: 02/14/2014 Elsevier Interactive Patient Education  2017 [employer].

## 2022-09-07 NOTE — Progress Notes (Signed)
Subjective:   Amber Hayes is a 67 y.o. female who presents for Medicare Annual (Subsequent) preventive examination.  Visit Complete: Virtual  I connected with  Amber Hayes on 09/07/22 by a audio enabled telemedicine application and verified that I am speaking with the correct person using two identifiers.  Patient Location: Home  Provider Location: Office/Clinic  I discussed the limitations of evaluation and management by telemedicine. The patient expressed understanding and agreed to proceed.  Vital Signs: Unable to obtain new vitals due to this being a telehealth visit.  Patient Medicare AWV questionnaire was completed by the patient on 09/03/22; I have confirmed that all information answered by patient is correct and no changes since this date.  Review of Systems    Cardiac Risk Factors include: advanced age (>74men, >32 women)    Objective:    Today's Vitals   09/07/22 0852  Weight: 127 lb (57.6 kg)  Height: 5\' 8"  (1.727 m)   Body mass index is 19.31 kg/m.     09/07/2022    9:01 AM 04/10/2019   11:07 AM 02/13/2019    9:11 AM 10/19/2018    7:58 AM 07/04/2016    3:10 PM 05/18/2015    8:13 AM  Advanced Directives  Does Patient Have a Medical Advance Directive? Yes Yes Yes Yes Yes Yes  Type of Estate agent of [place];Living will Healthcare Power of [place];Living will Healthcare Power of [place];Living will  Healthcare Power of [place];Living will Living will;Healthcare Power of Attorney  Does patient want to make changes to medical advance directive?  No - Patient declined No - Patient declined     Copy of Healthcare Power of Attorney in Chart?  No - copy requested Yes - validated most recent copy scanned in chart (See row information)       Current Medications (verified) Outpatient Encounter Medications as of 09/07/2022  Medication Sig   busPIRone (BUSPAR) 7.5 MG tablet Take 1 tablet (7.5 mg total) by mouth 2 (two) times daily.    citalopram (CELEXA) 20 MG tablet Take 1.5 tablets (30 mg total) by mouth daily.   No facility-administered encounter medications on file as of 09/07/2022.    Allergies (verified) Patient has no known allergies.   History: Past Medical History:  Diagnosis Date   Anxiety 2009   Cataract 2021   Had cataract surgery   Depression 1998   Osteoporosis    Past Surgical History:  Procedure Laterality Date   AUGMENTATION MAMMAPLASTY Bilateral 2003   CATARACT EXTRACTION W/PHACO Right 02/13/2019   Procedure: CATARACT EXTRACTION PHACO AND INTRAOCULAR LENS PLACEMENT (IOC) RIGHT 3.81 00:38.5 9.9%;  Surgeon: Lockie Mola, MD;  Location: [facility];  Service: Ophthalmology;  Laterality: Right;   CATARACT EXTRACTION W/PHACO Left 04/10/2019   Procedure: CATARACT EXTRACTION PHACO AND INTRAOCULAR LENS PLACEMENT (IOC) LEFT 2.02 00:27.3 7.4%;  Surgeon: Lockie Mola, MD;  Location: [facility];  Service: Ophthalmology;  Laterality: Left;   COLONOSCOPY WITH PROPOFOL N/A 10/19/2018   Procedure: COLONOSCOPY WITH PROPOFOL;  Surgeon: Wyline Mood, MD;  Location: [facility];  Service: Gastroenterology;  Laterality: N/A;   COSMETIC SURGERY  1998   Breast implants   EYE SURGERY  1999   Lasik   EYE SURGERY  2017   Torn Retina   Family History  Problem Relation Age of Onset   Alzheimer's disease Mother    Heart attack Father    Alcohol abuse Father    Diabetes Father    Heart disease Father  Hypertension Sister    Bronchitis Sister    Neurologic Disorder Sister        global transient amnesia x 2   Breast cancer Other    Social History   Socioeconomic History   Marital status: Married    Spouse name: Thayer Ohm   Number of children: 2   Years of education: undergrad   Highest education level: Not on file  Occupational History   Occupation: process applications; 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    Employer: [employer]    Comment: Part time; seasonal  Tobacco Use    Smoking status: Former    Current packs/day: 0.00    Average packs/day: 0.5 packs/day for 15.0 years (7.5 ttl pk-yrs)    Types: Cigarettes    Start date: 01/23/1989    Quit date: 01/24/2004    Years since quitting: 18.6   Smokeless tobacco: Never  Vaping Use   Vaping status: Never Used  Substance and Sexual Activity   Alcohol use: No   Drug use: No   Sexual activity: Yes    Birth control/protection: Post-menopausal  Other Topics Concern   Not on file  Social History Narrative   Not on file   Social Determinants of Health   Financial Resource Strain: Low Risk  (09/03/2022)   Overall Financial Resource Strain (CARDIA)    Difficulty of Paying Living Expenses: Not hard at all  Food Insecurity: No Food Insecurity (09/03/2022)   Hunger Vital Sign    Worried About Running Out of Food in the Last Year: Never true    Ran Out of Food in the Last Year: Never true  Transportation Needs: No Transportation Needs (09/03/2022)   PRAPARE - Administrator, Civil Service (Medical): No    Lack of Transportation (Non-Medical): No  Physical Activity: Sufficiently Active (09/03/2022)   Exercise Vital Sign    Days of Exercise per Week: 6 days    Minutes of Exercise per Session: 60 min  Stress: No Stress Concern Present (09/03/2022)   [employer] of Occupational Health - Occupational Stress Questionnaire    Feeling of Stress : Not at all  Social Connections: Unknown (09/03/2022)   Social Connection and Isolation Panel [NHANES]    Frequency of Communication with Friends and Family: More than three times a week    Frequency of Social Gatherings with Friends and Family: More than three times a week    Attends Religious Services: Not on Marketing executive or Organizations: Yes    Attends Engineer, structural: More than 4 times per year    Marital Status: Married    Tobacco Counseling Counseling given: Not Answered   Clinical Intake:  Pre-visit preparation  completed: Yes  Pain : No/denies pain     BMI - recorded: 19.31 Nutritional Status: BMI of 19-24  Normal Nutritional Risks: None Diabetes: No  How often do you need to have someone help you when you read instructions, pamphlets, or other written materials from your doctor or pharmacy?: 2 - Rarely  Interpreter Needed?: No  Comments: lives with husband Information entered by :: B.Shaden Lacher,LPN   Activities of Daily Living    09/03/2022    9:15 AM 01/06/2022   10:47 AM  In your present state of health, do you have any difficulty performing the following activities:  Hearing? 0 0  Vision? 0 0  Difficulty concentrating or making decisions? 0 0  Walking or climbing stairs? 0 0  Dressing or bathing? 0  0  Doing errands, shopping? 0 0  Preparing Food and eating ? N N  Using the Toilet? N N  In the past six months, have you accidently leaked urine? Y Y  Do you have problems with loss of bowel control? N N  Managing your Medications? N N  Managing your Finances? N N  Housekeeping or managing your Housekeeping? N N    Patient Care Team: Erasmo Downer, MD as PCP - General (Family Medicine) Lockie Mola, MD as Referring Physician (Ophthalmology)  Indicate any recent Medical Services you may have received from other than [facility] providers in the past year (date may be approximate).     Assessment:   This is a routine wellness examination for Monserrat.  Hearing/Vision screen Hearing Screening - Comments:: Adequate hearing Vision Screening - Comments:: Adequate vision;readers only (had cataract surgery) [facility]  Dietary issues and exercise activities discussed:     Goals Addressed   None    Depression Screen    09/07/2022    9:00 AM 01/10/2022    3:25 PM 04/30/2021   11:09 AM 01/07/2021   11:07 AM 12/25/2019    4:15 PM 11/08/2018   12:03 PM 02/08/2018    1:00 PM  PHQ 2/9 Scores  PHQ - 2 Score 0 0 0 2 0 0 0  PHQ- 9 Score  1 1 6 1 1 1     Fall Risk     09/03/2022    9:15 AM 01/06/2022   10:47 AM 04/30/2021   11:09 AM 01/07/2021   11:07 AM 12/25/2019    4:15 PM  Fall Risk   Falls in the past year? 0 1 0 0 0  Number falls in past yr: 0 0 0 0 0  Injury with Fall? 0 0 0 0 0  Risk for fall due to : No Fall Risks  No Fall Risks  No Fall Risks  Follow up Education provided;Falls prevention discussed  Falls evaluation completed  Falls evaluation completed    MEDICARE RISK AT HOME:   TIMED UP AND GO:  Was the test performed?  No    Cognitive Function:        09/07/2022    9:02 AM 01/10/2022    3:26 PM  6CIT Screen  What Year? 0 points 0 points  What month? 0 points 0 points  What time? 0 points 0 points  Count back from 20 0 points 0 points  Months in reverse 0 points 0 points  Repeat phrase 0 points 0 points  Total Score 0 points 0 points    Immunizations Immunization History  Administered Date(s) Administered   PFIZER(Purple Top)SARS-COV-2 Vaccination 03/29/2019, 04/18/2019, 01/05/2020   Pneumococcal Conjugate-13 12/25/2019   Pneumococcal Polysaccharide-23 11/08/2018   Tdap 07/13/2013   Zoster Recombinant(Shingrix) 02/24/2022, 07/29/2022    TDAP status: Up to date  Flu Vaccine status: Up to date  Pneumococcal vaccine status: Up to date  Covid-19 vaccine status: Completed vaccines  Qualifies for Shingles Vaccine? Yes   Zostavax completed Yes   Shingrix Completed?: Yes  Screening Tests Health Maintenance  Topic Date Due   COVID-19 Vaccine (4 - 2023-24 season) 09/24/2021   INFLUENZA VACCINE  08/25/2022   DTaP/Tdap/Td (2 - Td or Tdap) 07/14/2023   Medicare Annual Wellness (AWV)  09/07/2023   MAMMOGRAM  09/29/2023   DEXA SCAN  02/17/2024   Colonoscopy  10/18/2028   Pneumonia Vaccine 83+ Years old  Completed   Hepatitis C Screening  Completed   Zoster Vaccines-  Shingrix  Completed   HPV VACCINES  Aged Out    Health Maintenance  Health Maintenance Due  Topic Date Due   COVID-19 Vaccine (4 - 2023-24  season) 09/24/2021   INFLUENZA VACCINE  08/25/2022    Colorectal cancer screening: Type of screening: Colonoscopy. Completed yes. Repeat every 10 years  Mammogram status: Completed yes. Repeat every year  Bone Density status: Completed yes. Results reflect: Bone density results: OSTEOPOROSIS. Repeat every 3-5 years.  Lung Cancer Screening: (Low Dose CT Chest recommended if Age 7-80 years, 20 pack-year currently smoking OR have quit w/in 15years.) does not qualify.   Lung Cancer Screening Referral: no  Additional Screening:  Hepatitis C Screening: does not qualify; Completed yes  Vision Screening: Recommended annual ophthalmology exams for early detection of glaucoma and other disorders of the eye. Is the patient up to date with their annual eye exam?  Yes  Who is the provider or what is the name of the office in which the patient attends annual eye exams? [facility] If pt is not established with a provider, would they like to be referred to a provider to establish care? No .   Dental Screening: Recommended annual dental exams for proper oral hygiene  Diabetic Foot Exam: n/a  Community Resource Referral / Chronic Care Management: CRR required this visit?  No   CCM required this visit?  No     Plan:     I have personally reviewed and noted the following in the patient's chart:   Medical and social history Use of alcohol, tobacco or illicit drugs  Current medications and supplements including opioid prescriptions. Patient is not currently taking opioid prescriptions. Functional ability and status Nutritional status Physical activity Advanced directives List of other physicians Hospitalizations, surgeries, and ER visits in previous 12 months Vitals Screenings to include cognitive, depression, and falls Referrals and appointments  In addition, I have reviewed and discussed with patient certain preventive protocols, quality metrics, and best practice recommendations.  A written personalized care plan for preventive services as well as general preventive health recommendations were provided to patient.     Sue Lush, LPN   1/61/0960   After Visit Summary: (MyChart) Due to this being a telephonic visit, the after visit summary with patients personalized plan was offered to patient via MyChart   Nurse Notes:  The patient states she is doing well and has no concerns or questions at this time.

## 2022-09-12 DIAGNOSIS — M810 Age-related osteoporosis without current pathological fracture: Secondary | ICD-10-CM | POA: Diagnosis not present

## 2022-11-27 ENCOUNTER — Other Ambulatory Visit: Payer: Self-pay | Admitting: Medical Genetics

## 2022-11-27 DIAGNOSIS — Z006 Encounter for examination for normal comparison and control in clinical research program: Secondary | ICD-10-CM

## 2022-12-05 ENCOUNTER — Other Ambulatory Visit: Payer: Self-pay | Attending: Medical Genetics

## 2022-12-13 ENCOUNTER — Encounter: Payer: Self-pay | Admitting: Family Medicine

## 2023-01-12 ENCOUNTER — Ambulatory Visit: Payer: Medicare Other | Attending: Family Medicine

## 2023-01-12 ENCOUNTER — Ambulatory Visit (INDEPENDENT_AMBULATORY_CARE_PROVIDER_SITE_OTHER): Payer: Medicare Other | Admitting: Family Medicine

## 2023-01-12 ENCOUNTER — Encounter: Payer: Self-pay | Admitting: Family Medicine

## 2023-01-12 VITALS — BP 107/73 | HR 76 | Ht 68.0 in | Wt 128.5 lb

## 2023-01-12 DIAGNOSIS — F419 Anxiety disorder, unspecified: Secondary | ICD-10-CM | POA: Diagnosis not present

## 2023-01-12 DIAGNOSIS — R5383 Other fatigue: Secondary | ICD-10-CM | POA: Diagnosis not present

## 2023-01-12 DIAGNOSIS — Z0000 Encounter for general adult medical examination without abnormal findings: Secondary | ICD-10-CM

## 2023-01-12 DIAGNOSIS — R002 Palpitations: Secondary | ICD-10-CM

## 2023-01-12 DIAGNOSIS — E7800 Pure hypercholesterolemia, unspecified: Secondary | ICD-10-CM | POA: Diagnosis not present

## 2023-01-12 DIAGNOSIS — L989 Disorder of the skin and subcutaneous tissue, unspecified: Secondary | ICD-10-CM

## 2023-01-12 DIAGNOSIS — Z1231 Encounter for screening mammogram for malignant neoplasm of breast: Secondary | ICD-10-CM | POA: Diagnosis not present

## 2023-01-12 NOTE — Progress Notes (Signed)
Complete physical exam   Patient: Amber Hayes   DOB: September 23, 1955   67 y.o. Female  MRN: [id number] Visit Date: 01/12/2023  Today's healthcare provider: Shirlee Latch, MD   Chief Complaint  Patient presents with   Annual Exam    AWV 09/07/22 -Diet: General, well balanced -Exercise : 3 to 4 times a week for at least 30 minutes and also has a dog that she has to take for walks -Feeling: well but a little more tired than usual -Sleeping: well -Concerns: A blip, patient sent mychart msg in regards to concern   Subjective    Amber Hayes is a 67 y.o. female who presents today for a complete physical exam.    Discussed the use of AI scribe software for clinical note transcription with the patient, who gave verbal consent to proceed.  History of Present Illness   The patient, with a history of anxiety managed with Celexa and Buspar, presents for a routine physical examination. The patient reports experiencing an irregular heart rhythm described as a "blip," characterized by a beat, then a stop, then a start. The patient also reports feeling unusually tired, even during the day, which is unusual for her. The patient also notes a skin lesion on her left ankle that has been present for at least six months. The lesion is over a spider vein but does not blanch like the vein does, suggesting it may be a skin lesion rather than a vascular issue. The patient has been unable to see a dermatologist for this issue due to scheduling difficulties.        Last depression screening scores    09/07/2022    9:00 AM 01/10/2022    3:25 PM 04/30/2021   11:09 AM  PHQ 2/9 Scores  PHQ - 2 Score 0 0 0  PHQ- 9 Score  1 1   Last fall risk screening    09/03/2022    9:15 AM  Fall Risk   Falls in the past year? 0  Number falls in past yr: 0  Injury with Fall? 0  Risk for fall due to : No Fall Risks  Follow up Education provided;Falls prevention discussed        Medications: Outpatient  Medications Prior to Visit  Medication Sig   busPIRone (BUSPAR) 7.5 MG tablet Take 1 tablet (7.5 mg total) by mouth 2 (two) times daily.   citalopram (CELEXA) 20 MG tablet Take 1.5 tablets (30 mg total) by mouth daily.   No facility-administered medications prior to visit.    Review of Systems    Objective    BP 107/73 (BP Location: Left Arm, Patient Position: Sitting, Cuff Size: Normal)   Pulse 76   Ht 5\' 8"  (1.727 m)   Wt 128 lb 8 oz (58.3 kg)   SpO2 99%   BMI 19.54 kg/m    Physical Exam Vitals reviewed.  Constitutional:      General: She is not in acute distress.    Appearance: Normal appearance. She is well-developed. She is not diaphoretic.  HENT:     Head: Normocephalic and atraumatic.     Right Ear: Tympanic membrane, ear canal and external ear normal.     Left Ear: Tympanic membrane, ear canal and external ear normal.     Nose: Nose normal.     Mouth/Throat:     Mouth: Mucous membranes are moist.     Pharynx: Oropharynx is clear. No oropharyngeal exudate.  Eyes:  General: No scleral icterus.    Conjunctiva/sclera: Conjunctivae normal.     Pupils: Pupils are equal, round, and reactive to light.  Neck:     Thyroid: No thyromegaly.  Cardiovascular:     Rate and Rhythm: Normal rate and regular rhythm.     Heart sounds: Normal heart sounds. No murmur heard. Pulmonary:     Effort: Pulmonary effort is normal. No respiratory distress.     Breath sounds: Normal breath sounds. No wheezing or rales.  Abdominal:     General: There is no distension.     Palpations: Abdomen is soft.     Tenderness: There is no abdominal tenderness.  Musculoskeletal:        General: No deformity.     Cervical back: Neck supple.     Right lower leg: No edema.     Left lower leg: No edema.  Lymphadenopathy:     Cervical: No cervical adenopathy.  Skin:    General: Skin is warm and dry.     Findings: No rash.  Neurological:     Mental Status: She is alert and oriented to person,  place, and time. Mental status is at baseline.     Gait: Gait normal.  Psychiatric:        Mood and Affect: Mood normal.        Behavior: Behavior normal.        Thought Content: Thought content normal.        Results for orders placed or performed in visit on 01/12/23  Comprehensive metabolic panel  Result Value Ref Range   Glucose 70 70 - 99 mg/dL   BUN 15 8 - 27 mg/dL   Creatinine, Ser 6.01 0.57 - 1.00 mg/dL   eGFR 77 >09 NA/TFT/7.32   BUN/Creatinine Ratio 18 12 - 28   Sodium 143 134 - 144 mmol/L   Potassium 3.7 3.5 - 5.2 mmol/L   Chloride 102 96 - 106 mmol/L   CO2 27 20 - 29 mmol/L   Calcium 9.3 8.7 - 10.3 mg/dL   Total Protein 6.3 6.0 - 8.5 g/dL   Albumin 4.2 3.9 - 4.9 g/dL   Globulin, Total 2.1 1.5 - 4.5 g/dL   Bilirubin Total 0.3 0.0 - 1.2 mg/dL   Alkaline Phosphatase 106 44 - 121 IU/L   AST 21 0 - 40 IU/L   ALT 17 0 - 32 IU/L  CBC  Result Value Ref Range   WBC 6.5 3.4 - 10.8 x10E3/uL   RBC 4.75 3.77 - 5.28 x10E6/uL   Hemoglobin 14.3 11.1 - 15.9 g/dL   Hematocrit 20.2 54.2 - 46.6 %   MCV 92 79 - 97 fL   MCH 30.1 26.6 - 33.0 pg   MCHC 32.9 31.5 - 35.7 g/dL   RDW 70.6 23.7 - 62.8 %   Platelets 213 150 - 450 x10E3/uL  TSH  Result Value Ref Range   TSH 1.160 0.450 - 4.500 uIU/mL  B12  Result Value Ref Range   Vitamin B-12 714 232 - 1,245 pg/mL  VITAMIN D 25 Hydroxy (Vit-D Deficiency, Fractures)  Result Value Ref Range   Vit D, 25-Hydroxy 45.5 30.0 - 100.0 ng/mL  Lipid panel  Result Value Ref Range   Cholesterol, Total 191 100 - 199 mg/dL   Triglycerides 99 0 - 149 mg/dL   HDL 69 >31 mg/dL   VLDL Cholesterol Cal 18 5 - 40 mg/dL   LDL Chol Calc (NIH) 517 (H) 0 - 99 mg/dL   Chol/HDL  Ratio 2.8 0.0 - 4.4 ratio    Assessment & Plan    Routine Health Maintenance and Physical Exam  Exercise Activities and Dietary recommendations  Goals      Patient Stated     I want to increase my core and become physically stronger and  stay as mobile for as long as I  can         Immunization History  Administered Date(s) Administered   PFIZER(Purple Top)SARS-COV-2 Vaccination 03/29/2019, 04/18/2019, 01/05/2020   Pneumococcal Conjugate-13 12/25/2019   Pneumococcal Polysaccharide-23 11/08/2018   Tdap 07/13/2013   Zoster Recombinant(Shingrix) 02/24/2022, 07/29/2022    Health Maintenance  Topic Date Due   COVID-19 Vaccine (4 - 2024-25 season) 09/25/2022   INFLUENZA VACCINE  04/24/2023 (Originally 08/25/2022)   DTaP/Tdap/Td (2 - Td or Tdap) 07/14/2023   Medicare Annual Wellness (AWV)  09/07/2023   MAMMOGRAM  09/29/2023   DEXA SCAN  02/17/2024   Colonoscopy  10/18/2028   Pneumonia Vaccine 74+ Years old  Completed   Hepatitis C Screening  Completed   Zoster Vaccines- Shingrix  Completed   HPV VACCINES  Aged Out    Discussed health benefits of physical activity, and encouraged her to engage in regular exercise appropriate for her age and condition.  Problem List Items Addressed This Visit       Musculoskeletal and Integument   Skin lesion   Noted a skin lesion on the left anterior ankle, present for at least six months. concern for possible basal cell carcinoma. Agreed to send an urgent referral to a dermatologist and upload a picture of the lesion for further evaluation. - Send urgent referral to dermatologist - Upload picture of the lesion to chart      Relevant Orders   Ambulatory referral to Dermatology     Other   Anxiety   Well controlled Continue buspar and celexa at current dose      Pure hypercholesterolemia   Reviewed last lipid panel Not currently on a statin Recheck FLP and CMP Discussed diet and exercise       Relevant Orders   Comprehensive metabolic panel (Completed)   Lipid panel (Completed)   Palpitations   Reported hearing a 'blip' in heart rhythm, described as a beat, then a stop, then a start. does occasionally get a feeling in her chest that she associates with anxiety.  Plan to use a Zio patch heart  monitor for two weeks to capture any irregularities. Data will be reviewed by a cardiologist after the monitoring period. Immediate follow-up will be arranged if serious issues are found. - Order Zio patch heart monitor - Perform EKG in office - wnl - Order labs for electrolyte abnormalities, anemia, and thyroid function      Relevant Orders   EKG 12-Lead   LONG TERM MONITOR (3-14 DAYS)   Comprehensive metabolic panel (Completed)   CBC (Completed)   TSH (Completed)   Other Visit Diagnoses       Encounter for annual physical exam    -  Primary   Relevant Orders   Comprehensive metabolic panel (Completed)   CBC (Completed)   TSH (Completed)   B12 (Completed)   VITAMIN D 25 Hydroxy (Vit-D Deficiency, Fractures) (Completed)   Lipid panel (Completed)     Breast cancer screening by mammogram       Relevant Orders   MM 3D SCREENING MAMMOGRAM BILATERAL BREAST     Other fatigue       Relevant Orders   B12 (Completed)  VITAMIN D 25 Hydroxy (Vit-D Deficiency, Fractures) (Completed)           Fatigue Reported unusual fatigue, even while driving. Potential causes include anemia, thyroid issues, and vitamin deficiencies. Currently taking B12 supplements. Agreed to check vitamin D and B12 levels. - Order labs for anemia, thyroid function, vitamin D, and B12 levels  General Health Maintenance Due for a mammogram (last in September 2022). Up to date on pneumonia and shingles vaccines but declined COVID booster. Next tetanus booster due in 2025. - Order mammogram - Order cholesterol screening - Remind about tetanus booster due in 2025        Return in about 1 year (around 01/12/2024) for CPE.     Shirlee Latch, MD  [facility] [phone] (phone) [phone] (fax)  [facility]

## 2023-01-13 DIAGNOSIS — L989 Disorder of the skin and subcutaneous tissue, unspecified: Secondary | ICD-10-CM | POA: Insufficient documentation

## 2023-01-13 DIAGNOSIS — R002 Palpitations: Secondary | ICD-10-CM | POA: Insufficient documentation

## 2023-01-13 LAB — CBC
Hematocrit: 43.5 % (ref 34.0–46.6)
Hemoglobin: 14.3 g/dL (ref 11.1–15.9)
MCH: 30.1 pg (ref 26.6–33.0)
MCHC: 32.9 g/dL (ref 31.5–35.7)
MCV: 92 fL (ref 79–97)
Platelets: 213 10*3/uL (ref 150–450)
RBC: 4.75 x10E6/uL (ref 3.77–5.28)
RDW: 12.1 % (ref 11.7–15.4)
WBC: 6.5 10*3/uL (ref 3.4–10.8)

## 2023-01-13 LAB — VITAMIN D 25 HYDROXY (VIT D DEFICIENCY, FRACTURES): Vit D, 25-Hydroxy: 45.5 ng/mL (ref 30.0–100.0)

## 2023-01-13 LAB — LIPID PANEL
Chol/HDL Ratio: 2.8 {ratio} (ref 0.0–4.4)
Cholesterol, Total: 191 mg/dL (ref 100–199)
HDL: 69 mg/dL (ref >39)
LDL Chol Calc (NIH): 104 mg/dL — ABNORMAL HIGH (ref 0–99)
Triglycerides: 99 mg/dL (ref 0–149)
VLDL Cholesterol Cal: 18 mg/dL (ref 5–40)

## 2023-01-13 LAB — TSH: TSH: 1.16 u[IU]/mL (ref 0.450–4.500)

## 2023-01-13 LAB — COMPREHENSIVE METABOLIC PANEL
ALT: 17 [IU]/L (ref 0–32)
AST: 21 [IU]/L (ref 0–40)
Albumin: 4.2 g/dL (ref 3.9–4.9)
Alkaline Phosphatase: 106 [IU]/L (ref 44–121)
BUN/Creatinine Ratio: 18 (ref 12–28)
BUN: 15 mg/dL (ref 8–27)
Bilirubin Total: 0.3 mg/dL (ref 0.0–1.2)
CO2: 27 mmol/L (ref 20–29)
Calcium: 9.3 mg/dL (ref 8.7–10.3)
Chloride: 102 mmol/L (ref 96–106)
Creatinine, Ser: 0.82 mg/dL (ref 0.57–1.00)
Globulin, Total: 2.1 g/dL (ref 1.5–4.5)
Glucose: 70 mg/dL (ref 70–99)
Potassium: 3.7 mmol/L (ref 3.5–5.2)
Sodium: 143 mmol/L (ref 134–144)
Total Protein: 6.3 g/dL (ref 6.0–8.5)
eGFR: 77 mL/min/{1.73_m2} (ref >59)

## 2023-01-13 LAB — VITAMIN B12: Vitamin B-12: 714 pg/mL (ref 232–1245)

## 2023-01-13 NOTE — Assessment & Plan Note (Signed)
Reported hearing a 'blip' in heart rhythm, described as a beat, then a stop, then a start. does occasionally get a feeling in her chest that she associates with anxiety.  Plan to use a Zio patch heart monitor for two weeks to capture any irregularities. Data will be reviewed by a cardiologist after the monitoring period. Immediate follow-up will be arranged if serious issues are found. - Order Zio patch heart monitor - Perform EKG in office - wnl - Order labs for electrolyte abnormalities, anemia, and thyroid function

## 2023-01-13 NOTE — Assessment & Plan Note (Signed)
Well controlled Continue buspar and celexa at current dose

## 2023-01-13 NOTE — Assessment & Plan Note (Signed)
Reviewed last lipid panel Not currently on a statin Recheck FLP and CMP Discussed diet and exercise  

## 2023-01-13 NOTE — Assessment & Plan Note (Signed)
Noted a skin lesion on the left anterior ankle, present for at least six months. concern for possible basal cell carcinoma. Agreed to send an urgent referral to a dermatologist and upload a picture of the lesion for further evaluation. - Send urgent referral to dermatologist - Upload picture of the lesion to chart

## 2023-01-16 ENCOUNTER — Encounter: Payer: Self-pay | Admitting: Family Medicine

## 2023-01-19 DIAGNOSIS — R002 Palpitations: Secondary | ICD-10-CM | POA: Diagnosis not present

## 2023-02-06 DIAGNOSIS — R002 Palpitations: Secondary | ICD-10-CM | POA: Diagnosis not present

## 2023-02-07 ENCOUNTER — Encounter: Payer: Self-pay | Admitting: Family Medicine

## 2023-02-07 ENCOUNTER — Ambulatory Visit (INDEPENDENT_AMBULATORY_CARE_PROVIDER_SITE_OTHER): Payer: Medicare Other | Admitting: Family Medicine

## 2023-02-07 VITALS — BP 144/85 | HR 68

## 2023-02-07 DIAGNOSIS — R82998 Other abnormal findings in urine: Secondary | ICD-10-CM

## 2023-02-07 DIAGNOSIS — R822 Biliuria: Secondary | ICD-10-CM | POA: Diagnosis not present

## 2023-02-07 DIAGNOSIS — R8290 Unspecified abnormal findings in urine: Secondary | ICD-10-CM | POA: Diagnosis not present

## 2023-02-07 DIAGNOSIS — N3090 Cystitis, unspecified without hematuria: Secondary | ICD-10-CM | POA: Diagnosis not present

## 2023-02-07 DIAGNOSIS — K2100 Gastro-esophageal reflux disease with esophagitis, without bleeding: Secondary | ICD-10-CM

## 2023-02-07 LAB — POCT URINALYSIS DIPSTICK
Bilirubin, UA: NEGATIVE
Blood, UA: NEGATIVE
Glucose, UA: NEGATIVE
Ketones, UA: NEGATIVE
Nitrite, UA: NEGATIVE
Protein, UA: NEGATIVE
Spec Grav, UA: 1.01 (ref 1.010–1.025)
Urobilinogen, UA: 1 U/dL
pH, UA: 6.5 (ref 5.0–8.0)

## 2023-02-07 NOTE — Progress Notes (Signed)
 Acute visit   Patient: Amber Hayes   DOB: September 04, 1955   68 y.o. Female  MRN: [id number]  Chief Complaint  Patient presents with   Urinary Tract Infection    Patient also reports noticing a difference in smell when urinating   Subjective    Discussed the use of AI scribe software for clinical note transcription with the patient, who gave verbal consent to proceed.  History of Present Illness   The patient, with a history of cystocele, presents with a change in the smell of her urine. She first noticed the change around Christmas and initially attributed it to dietary changes. However, the smell has persisted, although it may have lessened in potency. She denies dysuria, urinary urgency, and frequency. She also denies urinary incontinence, except for occasional leakage after voiding and sneezing. She has been managing her cystocele with pelvic floor exercises.  In addition to the urinary changes, the patient has been experiencing a sensation of acid indigestion, which is a new symptom for her. She describes it as a constant feeling of something going down her throat. She occasionally feels the need to burp after eating. She has been managing this symptom with a home remedy of baking soda and water.        Review of Systems  Objective    BP (!) 144/85 (BP Location: Right Arm, Patient Position: Sitting, Cuff Size: Normal)   Pulse 68   SpO2 100%  Physical Exam Vitals reviewed.  Constitutional:      General: She is not in acute distress.    Appearance: She is well-developed.  HENT:     Head: Normocephalic and atraumatic.  Eyes:     General: No scleral icterus.    Conjunctiva/sclera: Conjunctivae normal.  Cardiovascular:     Rate and Rhythm: Normal rate and regular rhythm.  Pulmonary:     Effort: Pulmonary effort is normal. No respiratory distress.  Skin:    General: Skin is warm and dry.     Findings: No rash.  Neurological:     Mental Status: She is alert and oriented  to person, place, and time.  Psychiatric:        Behavior: Behavior normal.       Results for orders placed or performed in visit on 02/07/23  POCT urinalysis dipstick  Result Value Ref Range   Color, UA     Clarity, UA     Glucose, UA Negative Negative   Bilirubin, UA negative    Ketones, UA negative    Spec Grav, UA 1.010 1.010 - 1.025   Blood, UA negative    pH, UA 6.5 5.0 - 8.0   Protein, UA Negative Negative   Urobilinogen, UA 1.0 0.2 or 1.0 E.U./dL   Nitrite, UA negative    Leukocytes, UA Small (1+) (A) Negative   Appearance     Odor      Assessment & Plan     Problem List Items Addressed This Visit   None Visit Diagnoses       Foul smelling urine    -  Primary   Relevant Orders   POCT urinalysis dipstick (Completed)   Urine Culture     Leukocytes in urine       Relevant Orders   Urine Culture     Cystitis         Gastroesophageal reflux disease with esophagitis, unspecified whether hemorrhage  Urinary Tract Infection (UTI) Reports a strange smell to urine since around Christmas. Urinalysis shows leukocytes, indicating a possible UTI. No symptoms of dysuria, urgency, or frequency. Awaiting urine culture to confirm infection before starting antibiotics. If culture shows bacteria, treatment will be based on sensitivity. If no bacteria and symptoms improve, no further action needed. - Send urine sample for culture - Await culture results before initiating antibiotic treatment - Advise to report any new symptoms such as dysuria, fever, or increased urgency  Gastroesophageal Reflux Disease (GERD) Reports new sensation of acid indigestion and frequent eructation. No prior history of acid reflux. Symptoms are intermittent and mild. Discussed potential use of over-the-counter Pepcid as needed. Explained that Pepcid is mild, works on histamine receptors, and does not affect long-term acid production. Also discussed home remedy of baking soda and water  for neutralizing acid. - Recommend over-the-counter Pepcid as needed for symptom relief - Discuss home remedy of baking soda and water for neutralizing acid  Cystocele Constant feeling of pressure in the pelvic area. No new symptoms or leakage reported. Continues pelvic floor exercises. - Continue pelvic floor exercises  Fatigue Reports ongoing fatigue despite being active and not working. Awaiting results from a heart monitor to rule out any underlying cardiac issues. - Await heart monitor results  Follow-up - Follow up on urine culture results by Friday - Follow up on heart monitor results when available.        No orders of the defined types were placed in this encounter.    Return if symptoms worsen or fail to improve.      Jon Eva, MD  [facility] [phone] (phone) [phone] (fax)  [facility]

## 2023-02-09 LAB — URINE CULTURE: Organism ID, Bacteria: NO GROWTH

## 2023-02-13 ENCOUNTER — Encounter: Payer: Self-pay | Admitting: Family Medicine

## 2023-02-13 DIAGNOSIS — K080 Exfoliation of teeth due to systemic causes: Secondary | ICD-10-CM | POA: Diagnosis not present

## 2023-02-17 DIAGNOSIS — K080 Exfoliation of teeth due to systemic causes: Secondary | ICD-10-CM | POA: Diagnosis not present

## 2023-02-20 DIAGNOSIS — H04123 Dry eye syndrome of bilateral lacrimal glands: Secondary | ICD-10-CM | POA: Diagnosis not present

## 2023-02-20 DIAGNOSIS — Z961 Presence of intraocular lens: Secondary | ICD-10-CM | POA: Diagnosis not present

## 2023-02-20 DIAGNOSIS — H35372 Puckering of macula, left eye: Secondary | ICD-10-CM | POA: Diagnosis not present

## 2023-03-01 DIAGNOSIS — M810 Age-related osteoporosis without current pathological fracture: Secondary | ICD-10-CM | POA: Diagnosis not present

## 2023-03-08 DIAGNOSIS — M810 Age-related osteoporosis without current pathological fracture: Secondary | ICD-10-CM | POA: Diagnosis not present

## 2023-03-17 DIAGNOSIS — K080 Exfoliation of teeth due to systemic causes: Secondary | ICD-10-CM | POA: Diagnosis not present

## 2023-03-18 ENCOUNTER — Other Ambulatory Visit: Payer: Self-pay | Admitting: Family Medicine

## 2023-03-20 NOTE — Telephone Encounter (Signed)
 Requested medication (s) are due for refill today: Yes  Requested medication (s) are on the active medication list: Yes  Last refill:  01/10/22  Future visit scheduled: Yes  Notes to clinic:  Prescriptions have expired.    Requested Prescriptions  Pending Prescriptions Disp Refills   citalopram (CELEXA) 20 MG tablet [Pharmacy Med Name: Citalopram Hydrobromide 20 MG Oral Tablet] 132 tablet 0    Sig: TAKE 1 & 1/2 (ONE & ONE-HALF) TABLETS BY MOUTH ONCE DAILY     Psychiatry:  Antidepressants - SSRI Passed - 03/20/2023  1:26 PM      Passed - Valid encounter within last 6 months    Recent Outpatient Visits           1 month ago Foul smelling urine   [facility] [place], Marzella Schlein, MD   2 months ago Encounter for annual physical exam   [place] [facility] [place], Marzella Schlein, MD   1 year ago Medicare annual wellness visit, initial   [facility] [place], Marzella Schlein, MD   1 year ago Blood pressure elevated without history of HTN   [place] [facility] [place], Marzella Schlein, MD   2 years ago Blood pressure elevated without history of HTN   [place] [facility] [place], Marzella Schlein, MD       Future Appointments             In 3 months Willeen Niece, MD [facility]   In 10 months Bacigalupo, Marzella Schlein, MD [facility]             busPIRone (BUSPAR) 7.5 MG tablet [Pharmacy Med Name: busPIRone HCl 7.5 MG Oral Tablet] 176 tablet 0    Sig: Take 1 tablet by mouth twice daily     Psychiatry: Anxiolytics/Hypnotics - Non-controlled Passed - 03/20/2023  1:26 PM      Passed - Valid encounter within last 12 months    Recent Outpatient Visits           1 month ago Foul smelling urine   [facility] [place], Marzella Schlein, MD   2 months ago Encounter for annual physical exam   [place] [facility] [place], Marzella Schlein, MD   1 year ago Medicare annual wellness visit, initial   [place] [facility] [place], Marzella Schlein, MD   1 year ago Blood pressure elevated without history of HTN   [place] [facility] [place], Marzella Schlein, MD   2 years ago Blood pressure elevated without history of HTN   [place] [place], Marzella Schlein, MD       Future Appointments             In 3 months Willeen Niece, MD [facility]   In 10 months Bacigalupo, Marzella Schlein, MD [facility]

## 2023-03-22 DIAGNOSIS — K080 Exfoliation of teeth due to systemic causes: Secondary | ICD-10-CM | POA: Diagnosis not present

## 2023-04-19 ENCOUNTER — Telehealth: Admitting: Physician Assistant

## 2023-04-19 DIAGNOSIS — S060X0A Concussion without loss of consciousness, initial encounter: Secondary | ICD-10-CM

## 2023-04-19 DIAGNOSIS — M25532 Pain in left wrist: Secondary | ICD-10-CM | POA: Diagnosis not present

## 2023-04-19 DIAGNOSIS — W19XXXA Unspecified fall, initial encounter: Secondary | ICD-10-CM

## 2023-04-19 DIAGNOSIS — R0781 Pleurodynia: Secondary | ICD-10-CM

## 2023-04-19 NOTE — Patient Instructions (Signed)
 Amber Hayes, thank you for joining Margaretann Loveless, PA-C for today's virtual visit.  While this provider is not your primary care provider (PCP), if your PCP is located in our provider database this encounter information will be shared with them immediately following your visit.   A [facility] MyChart account gives you access to today's visit and all your visits, tests, and labs performed at [facility] " click here if you don't have a [facility] MyChart account or go to [email or web address]  Consent: (Patient) Amber Hayes provided verbal consent for this virtual visit at the beginning of the encounter.  Current Medications:  Current Outpatient Medications:    busPIRone (BUSPAR) 7.5 MG tablet, Take 1 tablet by mouth twice daily, Disp: 176 tablet, Rfl: 0   citalopram (CELEXA) 20 MG tablet, TAKE 1 & 1/2 (ONE & ONE-HALF) TABLETS BY MOUTH ONCE DAILY, Disp: 132 tablet, Rfl: 0   Medications ordered in this encounter:  No orders of the defined types were placed in this encounter.    *If you need refills on other medications prior to your next appointment, please contact your pharmacy*  Follow-Up: Call back or seek an in-person evaluation if the symptoms worsen or if the condition fails to improve as anticipated.  [facility] [phone]  Other Instructions  Concussion, Adult  A concussion is a brain injury from a hard, direct hit (trauma) to the head or body. This direct hit causes the brain to shake quickly back and forth inside the skull. This can damage brain cells and cause chemical changes in the brain. A concussion may also be known as a mild traumatic brain injury (TBI). The effects of a concussion can be serious. If you have a concussion, you should be very careful to avoid having a second concussion. What are the causes? This condition is caused by: A direct hit to your head. Sudden movement of your body that causes your brain to move  back and forth inside the skull, such as in a car crash. What are the signs or symptoms? The signs of a concussion can be hard to notice. Early on, they may be missed by you, family members, and health care providers. You may look fine on the outside but may act or feel differently. Every head injury is different. Symptoms are usually temporary but may last for days, weeks, or even months. Some symptoms appear right away, but other symptoms may not show up for hours or days. Physical symptoms Headaches. Dizziness and problems with coordination or balance. Sensitivity to light or noise. Nausea or vomiting. Tiredness (fatigue). Vision or hearing problems. Seizure. Mental and emotional symptoms Irritability or mood changes. Memory problems. Trouble concentrating, organizing, or making decisions. Changes in eating or sleeping patterns. Slowness in thinking, acting or reacting, speaking, or reading. Anxiety or depression. How is this diagnosed? This condition is diagnosed based on your symptoms and injury. You may also have tests, including: Imaging tests, such as a CT scan or an MRI. Neuropsychological tests. These measure your thinking, understanding, learning, and memory. How is this treated? Treatment for this condition includes: Stopping sports or activity if you are injured. Physical and mental rest and careful observation, usually at home. Medicines to help with symptoms such as headaches, nausea, or difficulty sleeping. Referral to a concussion clinic or rehab center. Follow these instructions at home: Activity Limit activities that require a lot of thought or concentration, such as: Doing homework or job-related work. Watching TV. Using the computer  or phone. Playing memory games and doing puzzles. Rest helps your brain heal. Make sure you: Get plenty of sleep. Most adults should get 7-9 hours of sleep each night. Rest during the day. Take naps or rest breaks when you feel  tired. Avoid high-intensity exercise or physical activities that take a lot of effort. Stop any activity that worsens symptoms. Your health care provider may recommend light exercise such as walking. Do not do high-risk activities that could cause a second concussion, such as riding a bike or playing sports. Ask your health care provider when you can return to your normal activities, such as school, work, sports, and driving. Your ability to react may be slower after a brain injury. Never do these activities if you are dizzy. General instructions  Take over-the-counter and prescription medicines only as told by your health care provider. Some medicines, such as blood thinners (anticoagulants) and aspirin, may increase the risk for complications, such as bleeding. Avoid taking opioid pain medicine while recovering from a concussion. Do not drink alcohol until your health care provider says you can. Drinking alcohol may slow your recovery and can put you at risk of further injury. Watch your symptoms and tell others around you to do the same. Complications sometimes occur after a concussion. Tell your work Production designer, theatre/television/film, teachers, Tax adviser, school counselor, coach, or sports trainer about your injury, symptoms, and restrictions. See a mental health therapist if you feel anxious or depressed. Managing this condition can be challenging. Keep all follow-up visits. Your health care provider will check on your recovery and give you a plan for returning to activities. How is this prevented? Avoiding another brain injury is very important. In rare cases, another injury can lead to permanent brain damage, brain swelling, or death. The risk of this is greatest during the first 7-10 days after a head injury. Avoid injuries by: Stopping activities that could lead to a second concussion, such as contact or recreational sports, until your health care provider says it is okay. Taking these actions once you have  returned to sports or activities: Avoid plays or moves that can cause you to crash into another person. This is how most concussions occur. Follow the rules and be respectful of other players. Do not engage in violent or illegal plays. Getting regular exercise that includes strength and balance training. Wearing a properly fitting helmet during sports, biking, or other activities. Helmets can help protect you from serious skull and brain injuries, but they may not protect you from a concussion. Even when wearing a helmet, you should avoid being hit in the head. Where to find more information Centers for Disease Control and Prevention: TonerPromos.no Contact a health care provider if: Your symptoms do not improve or get worse. You have new symptoms. You have another injury. Your coordination gets worse. You have unusual behavior changes. Get help right away if: You have a severe or worsening headache. You have weakness or numbness in any part of your body, slurred speech, vision changes, or confusion. You vomit repeatedly. You lose consciousness, are sleepier than normal, or are difficult to wake up. You have a seizure. These symptoms may be an emergency. Get help right away. Call 911. Do not wait to see if the symptoms will go away. Do not drive yourself to the hospital. Also, get help right away if: You have thoughts of hurting yourself or others. Take one of these steps if you feel like you may hurt yourself or others, or have  thoughts about taking your own life: Go to your nearest emergency room. Call 911. Call the National Suicide Prevention Lifeline at 586-234-4657 or 988. This is open 24 hours a day. Text the Crisis Text Line at 878-032-2678. This information is not intended to replace advice given to you by your health care provider. Make sure you discuss any questions you have with your health care provider. Document Revised: 06/04/2021 Document Reviewed: 06/04/2021 Elsevier Patient  Education  2024 Elsevier Inc.   If you have been instructed to have an in-person evaluation today at a local Urgent Care facility, please use the link below. It will take you to a list of all of our available [facility], including address, phone number and hours of operation. Please do not delay care.  [facility]  If you or a family member do not have a primary care provider, use the link below to schedule a visit and establish care. When you choose a [facility] primary care physician or advanced practice provider, you gain a long-term partner in health. Find a Primary Care Provider  Learn more about [facility]'s in-office and virtual care options: [facility] - Get Care Now

## 2023-04-19 NOTE — Progress Notes (Signed)
 Virtual Visit Consent   [name], you are scheduled for a virtual visit with a [facility] provider today. Just as with appointments in the office, your consent must be obtained to participate. Your consent will be active for this visit and any virtual visit you may have with one of our providers in the next 365 days. If you have a MyChart account, a copy of this consent can be sent to you electronically.  As this is a virtual visit, video technology does not allow for your provider to perform a traditional examination. This may limit your provider's ability to fully assess your condition. If your provider identifies any concerns that need to be evaluated in person or the need to arrange testing (such as labs, EKG, etc.), we will make arrangements to do so. Although advances in technology are sophisticated, we cannot ensure that it will always work on either your end or our end. If the connection with a video visit is poor, the visit may have to be switched to a telephone visit. With either a video or telephone visit, we are not always able to ensure that we have a secure connection.  By engaging in this virtual visit, you consent to the provision of healthcare and authorize for your insurance to be billed (if applicable) for the services provided during this visit. Depending on your insurance coverage, you may receive a charge related to this service.  I need to obtain your verbal consent now. Are you willing to proceed with your visit today? [name] has provided verbal consent on 04/19/2023 for a virtual visit (video or telephone). Margaretann Loveless, PA-C  Date: 04/19/2023 9:17 AM   Virtual Visit via Video Note   I, Margaretann Loveless, connected with  [name]  ([id number], Mar 11, 1955) on 04/19/23 at  8:15 AM EDT by a video-enabled telemedicine application and verified that I am speaking with the correct person using two identifiers.  Location: Patient: Virtual Visit Location  Patient: Home Provider: Virtual Visit Location Provider: Home Office   I discussed the limitations of evaluation and management by telemedicine and the availability of in person appointments. The patient expressed understanding and agreed to proceed.    History of Present Illness: Amber Hayes is a 68 y.o. who identifies as a female who was assigned female at birth, and is being seen today for fall and hit her head.   Was walking her dog last night around 7pm and her dog became startled and pulled her down. She landed on the left side and hit the left side of her head on the ground. She was in grass. She reports initially she felt shaking up and had a mild headache. Also had mild brain fog. She was able to finish her walk with her dog.   She rested last night and slept on her back, not rolling over like she normally would due to pain on left side. She awoke this morning with a mild headache, but does report she awakes with headaches often. It is not any worse than a normal headache for her. She does still have some fatigue feeling as well. Otherwise, feels normal.  Denies double vision, severe headache, hearing loss and/or tinnitus, nausea, vomiting, severe dizziness, loss of balance, or confusion.  Denies any current bruising on ribs, but is sore. Does mention she can bruise easily.  Left hand and wrist she is able to move without issue, mildly tender. Not a concern at this time for her, but is monitoring.  Is having neck stiffness on the left side.   Problems:  Patient Active Problem List   Diagnosis Date Noted   Palpitations 01/13/2023   Skin lesion 01/13/2023   Lumbar pain with radiation down right leg 04/08/2022   Neuropathy 01/14/2021   Pelvic pressure in female 01/14/2021   Abdominal pain 12/05/2019   Neck pain 10/25/2019   Chronic constipation 10/25/2019   Age-related osteoporosis without current pathological fracture 12/10/2018   Panic attack 06/23/2017   Cystocele  without uterine prolapse 12/29/2016   Vaginal atrophy 11/18/2016   Allergic rhinitis 05/14/2015   Blood pressure elevated without history of HTN 05/14/2015   Decreased libido 05/14/2015   Abnormal bruising 05/14/2015   Pure hypercholesterolemia 05/14/2015   Anxiety 02/16/2015   Atypical chest pain 07/10/2009   Alcohol use 12/05/2007    Allergies: No Known Allergies Medications:  Current Outpatient Medications:    busPIRone (BUSPAR) 7.5 MG tablet, Take 1 tablet by mouth twice daily, Disp: 176 tablet, Rfl: 0   citalopram (CELEXA) 20 MG tablet, TAKE 1 & 1/2 (ONE & ONE-HALF) TABLETS BY MOUTH ONCE DAILY, Disp: 132 tablet, Rfl: 0  Observations/Objective: Patient is well-developed, well-nourished in no acute distress.  Resting comfortably at home.  Head is normocephalic, atraumatic.  No labored breathing.  Speech is clear and coherent with logical content.  Patient is alert and oriented at baseline.    Assessment and Plan: 1. Concussion without loss of consciousness, initial encounter (Primary)  2. Fall, initial encounter  3. Rib pain on left side  4. Left wrist pain  - Suspect mild concussion and bruising of ribs and wrist/hand and neck spasm - Discussed concussion symptoms and warning signs to monitor for over the next 3 days, if any appear she is to seek immediate in person evaluation at closest ER - Limit screen time and rest when needed - Avoid NSAIDs during first 24 hours (Aleve/naproxen, Advil/Motrin/ibuprofen), but can add in after if needed  - Can use Tylenol for pain - Heat to neck - Light neck stretches - Ice to ribs and wrist over next 24 hours, then can add heat as well - Follow up in person if not improving or if symptoms worsen  Follow Up Instructions: I discussed the assessment and treatment plan with the patient. The patient was provided an opportunity to ask questions and all were answered. The patient agreed with the plan and demonstrated an understanding of  the instructions.  A copy of instructions were sent to the patient via MyChart unless otherwise noted below.    The patient was advised to call back or seek an in-person evaluation if the symptoms worsen or if the condition fails to improve as anticipated.    Margaretann Loveless, PA-C

## 2023-04-24 ENCOUNTER — Encounter: Payer: Self-pay | Admitting: Family Medicine

## 2023-04-24 ENCOUNTER — Ambulatory Visit (INDEPENDENT_AMBULATORY_CARE_PROVIDER_SITE_OTHER): Admitting: Family Medicine

## 2023-04-24 ENCOUNTER — Ambulatory Visit: Payer: Self-pay

## 2023-04-24 ENCOUNTER — Ambulatory Visit
Admission: RE | Admit: 2023-04-24 | Discharge: 2023-04-24 | Disposition: A | Discharge status: Home / Self Care | Source: Ambulatory Visit | Attending: Family Medicine | Admitting: Family Medicine

## 2023-04-24 VITALS — BP 131/61 | HR 74 | Resp 16 | Ht 68.0 in | Wt 130.9 lb

## 2023-04-24 DIAGNOSIS — R5383 Other fatigue: Secondary | ICD-10-CM

## 2023-04-24 DIAGNOSIS — Z9181 History of falling: Secondary | ICD-10-CM | POA: Diagnosis not present

## 2023-04-24 DIAGNOSIS — R4182 Altered mental status, unspecified: Secondary | ICD-10-CM | POA: Diagnosis not present

## 2023-04-24 DIAGNOSIS — S0990XA Unspecified injury of head, initial encounter: Secondary | ICD-10-CM | POA: Diagnosis not present

## 2023-04-24 NOTE — Telephone Encounter (Signed)
 Chief Complaint: Feels "off" Symptoms: lethargy, HA Frequency: Ongoing since fall with head injury 03/25 Pertinent Negatives: Patient denies dizziness, weakness, numbness Disposition: [] ED /[] Urgent Care (no appt availability in office) / [x] Appointment(In office/virtual)/ []  [facility]/ [] Home Care/ [] Refused Recommended Disposition /[] [facility]/ []  Follow-up with PCP Additional Notes: Pt reports she had a fall while walking her dog on 03/25 and hit her head. Pt was seen via virtual visit but notes she has been feeling "off" since the fall to include increased lethargy and HA. OV scheduled today. This RN educated pt on home care, new-worsening symptoms, when to call back/seek emergent care. Pt verbalized understanding and agrees to plan.    Copied from CRM [id number]. Topic: Clinical - Red Word Triage >> Apr 24, 2023 10:58 AM Carlatta H wrote: [facility] that prompted transfer to Nurse Triage: Larey Seat and hit head while walking dog on 3/25 and had a virtual call but she now feels off//Patient is now sleeping more and feeling lethargic// Reason for Disposition  Neck pain (and neurologic deficit)  Answer Assessment - Initial Assessment Questions 1. SYMPTOM: "What is the main symptom you are concerned about?" (e.g., weakness, numbness)     "Feels off" lethargic 2. ONSET: "When did this start?" (minutes, hours, days; while sleeping)     Since fall and hit head 03/25 3. LAST NORMAL: "When was the last time you (the patient) were normal (no symptoms)?"     Constant 6. NEUROLOGIC SYMPTOMS: "Have you had any of the following symptoms: headache, dizziness, vision loss, double vision, changes in speech, unsteady on your feet?"     Friend notes pt looks "exhausted" lethargy, HA  Protocols used: Neurologic Deficit-A-AH

## 2023-04-24 NOTE — Progress Notes (Signed)
 Established patient visit   Patient: Amber Hayes   DOB: Jul 20, 1955   68 y.o. Female  MRN: [id number] Visit Date: 04/24/2023  Today's healthcare provider: Mila Merry, MD   Chief Complaint  Patient presents with   Amber Hayes last Tuesday and hit head against the ground. Symptoms: Lethargy/foggy, fatigue.   Subjective    Fall Pertinent negatives include no abdominal pain, fever, nausea, numbness or vomiting.   Patient is 68 year old female patient of Dr. Caleb Hayes who presents with a head injury following a fall.  She experienced a fall 5 nights ago when her dog pulled her, causing her to hit her head on the very hard on the ground. She possibly lost consciousness briefly but was able to get up and walk home. She contacted her husband and a friend for support after the incident.  Since the fall, she has been feeling increasing exhausted and 'off., not thinking as clearly, especially the last couple of days. There was no lump on her head following the impact. Her vision is not as sharp as usual, but she denies any double vision. She has been avoiding electronics. No other focula symptoms such focal weakness, numbness or tingling.   Medications: Outpatient Medications Prior to Visit  Medication Sig   Biotin 10 MG CHEW Chew by mouth.   busPIRone (BUSPAR) 7.5 MG tablet Take 1 tablet by mouth twice daily   Cholecalciferol (D 1000) 25 MCG (1000 UT) capsule Take by mouth.   citalopram (CELEXA) 20 MG tablet TAKE 1 & 1/2 (ONE & ONE-HALF) TABLETS BY MOUTH ONCE DAILY   Collagen-Vitamin C-Biotin (COLLAGEN PO) Take by mouth. gummy   Cyanocobalamin (VITAMIN B 12 PO) Take by mouth. gummy   Iron, Ferrous Sulfate, 325 (65 Fe) MG TABS Take by mouth.   No facility-administered medications prior to visit.    Review of Systems  Constitutional:  Positive for fatigue. Negative for appetite change, chills and fever.  Respiratory:  Negative for chest tightness and shortness of breath.    Cardiovascular:  Negative for chest pain and palpitations.  Gastrointestinal:  Negative for abdominal pain, nausea and vomiting.  Neurological:  Positive for light-headedness. Negative for dizziness, seizures, facial asymmetry, speech difficulty, weakness and numbness.       Objective    BP 131/61 (BP Location: Left Arm, Patient Position: Sitting, Cuff Size: Normal)   Pulse 74   Resp 16   Ht 5\' 8"  (1.727 m)   Wt 130 lb 14.4 oz (59.4 kg)   SpO2 100%   BMI 19.90 kg/m    Physical Exam  General appearance: Thin female, cooperative and in no acute distress Head: Normocephalic, without obvious abnormality, atraumatic Respiratory: Respirations even and unlabored, normal respiratory rate Extremities: All extremities are intact.  Skin: Skin color, texture, turgor normal. No rashes seen  Psych: Appropriate mood and affect. Neurologic: Mental status: Awake, oriented to person, place, and time, thought content appropriate, but sluggish. .   Assessment & Plan     Head injury with possible subdural hematoma 5 days post injury with worsening subsequent exhaustion and feeling unwell. There is no external lump but possible brief loss of consciousness. Concern exists for a subdural hematoma, which can develop days post-injury due to rapid deceleration, even on a soft surface. Her condition is  worsening, raising suspicion for a subdural hematoma. She was informed about the potential seriousness, including the rare risk of sudden death if untreated. A CT scan is necessary to  evaluate the presence and size of a hematoma, as it provides a detailed view without IV contrast. - Order stat head CT to rule out subdural hematoma         Amber Merry, MD  [facility] [phone] (phone) [phone] (fax)  [facility]

## 2023-04-24 NOTE — Telephone Encounter (Signed)
  1st attempt - patient disconnected prior to triage. Attempted to call back, received VMB. LVM to return call. Will continue to attempt.  Copied from CRM [id number]. Topic: Clinical - Red Word Triage >> Apr 24, 2023 10:55 AM Higinio Roger wrote: Red Word that prompted transfer to Nurse Triage:   Larey Seat and hit head while walking dog on 3/25 and had a virtual call but she now feels off   Callback: [phone]

## 2023-04-24 NOTE — Telephone Encounter (Signed)
  3rd attempt, no contact routing to practice.   Copied from CRM [id number]. Topic: Clinical - Red Word Triage >> Apr 24, 2023 10:55 AM Higinio Roger wrote: Red Word that prompted transfer to Nurse Triage:   Larey Seat and hit head while walking dog on 3/25 and had a virtual call but she now feels off    Callback: [phone] Reason for Disposition . Third attempt to contact caller AND no contact made. Phone number verified.  Protocols used: No Contact or Duplicate Contact Call-A-AH

## 2023-04-24 NOTE — Telephone Encounter (Signed)
 2nd attempt. Patient called, left VM to return the call to the office to speak to NT.    Copied from CRM [id number]. Topic: Clinical - Red Word Triage >> Apr 24, 2023 10:55 AM Higinio Roger wrote: Red Word that prompted transfer to Nurse Triage:   Larey Seat and hit head while walking dog on 3/25 and had a virtual call but she now feels off    Callback: [phone]

## 2023-04-25 NOTE — Telephone Encounter (Signed)
 Patient was seen yesterday with Dr.Fisher.

## 2023-05-01 DIAGNOSIS — H33312 Horseshoe tear of retina without detachment, left eye: Secondary | ICD-10-CM | POA: Diagnosis not present

## 2023-05-01 DIAGNOSIS — G43109 Migraine with aura, not intractable, without status migrainosus: Secondary | ICD-10-CM | POA: Diagnosis not present

## 2023-05-01 DIAGNOSIS — H35372 Puckering of macula, left eye: Secondary | ICD-10-CM | POA: Diagnosis not present

## 2023-05-01 DIAGNOSIS — Z961 Presence of intraocular lens: Secondary | ICD-10-CM | POA: Diagnosis not present

## 2023-05-03 ENCOUNTER — Ambulatory Visit
Admission: RE | Admit: 2023-05-03 | Discharge: 2023-05-03 | Disposition: A | Discharge status: Home / Self Care | Source: Ambulatory Visit | Attending: Family Medicine | Admitting: Family Medicine

## 2023-05-03 DIAGNOSIS — Z1231 Encounter for screening mammogram for malignant neoplasm of breast: Secondary | ICD-10-CM | POA: Insufficient documentation

## 2023-05-05 ENCOUNTER — Other Ambulatory Visit: Payer: Self-pay | Admitting: Family Medicine

## 2023-05-05 DIAGNOSIS — R928 Other abnormal and inconclusive findings on diagnostic imaging of breast: Secondary | ICD-10-CM

## 2023-05-12 ENCOUNTER — Ambulatory Visit
Admission: RE | Admit: 2023-05-12 | Discharge: 2023-05-12 | Disposition: A | Discharge status: Home / Self Care | Source: Ambulatory Visit | Attending: Family Medicine | Admitting: Family Medicine

## 2023-05-12 DIAGNOSIS — R92343 Mammographic extreme density, bilateral breasts: Secondary | ICD-10-CM | POA: Diagnosis not present

## 2023-05-12 DIAGNOSIS — R928 Other abnormal and inconclusive findings on diagnostic imaging of breast: Secondary | ICD-10-CM

## 2023-05-15 ENCOUNTER — Encounter: Payer: Self-pay | Admitting: Family Medicine

## 2023-06-13 ENCOUNTER — Other Ambulatory Visit: Payer: Self-pay | Admitting: Family Medicine

## 2023-06-14 ENCOUNTER — Other Ambulatory Visit: Payer: Self-pay | Admitting: Family Medicine

## 2023-07-11 ENCOUNTER — Encounter: Payer: Self-pay | Admitting: Dermatology

## 2023-07-11 ENCOUNTER — Ambulatory Visit: Payer: Medicare Other | Admitting: Dermatology

## 2023-07-11 DIAGNOSIS — I8393 Asymptomatic varicose veins of bilateral lower extremities: Secondary | ICD-10-CM

## 2023-07-11 DIAGNOSIS — W908XXA Exposure to other nonionizing radiation, initial encounter: Secondary | ICD-10-CM

## 2023-07-11 DIAGNOSIS — D229 Melanocytic nevi, unspecified: Secondary | ICD-10-CM

## 2023-07-11 DIAGNOSIS — D1801 Hemangioma of skin and subcutaneous tissue: Secondary | ICD-10-CM

## 2023-07-11 DIAGNOSIS — D2371 Other benign neoplasm of skin of right lower limb, including hip: Secondary | ICD-10-CM

## 2023-07-11 DIAGNOSIS — L814 Other melanin hyperpigmentation: Secondary | ICD-10-CM | POA: Diagnosis not present

## 2023-07-11 DIAGNOSIS — L988 Other specified disorders of the skin and subcutaneous tissue: Secondary | ICD-10-CM

## 2023-07-11 DIAGNOSIS — L570 Actinic keratosis: Secondary | ICD-10-CM | POA: Diagnosis not present

## 2023-07-11 DIAGNOSIS — L578 Other skin changes due to chronic exposure to nonionizing radiation: Secondary | ICD-10-CM

## 2023-07-11 DIAGNOSIS — Z1283 Encounter for screening for malignant neoplasm of skin: Secondary | ICD-10-CM

## 2023-07-11 DIAGNOSIS — D239 Other benign neoplasm of skin, unspecified: Secondary | ICD-10-CM

## 2023-07-11 DIAGNOSIS — L821 Other seborrheic keratosis: Secondary | ICD-10-CM

## 2023-07-11 DIAGNOSIS — D2262 Melanocytic nevi of left upper limb, including shoulder: Secondary | ICD-10-CM

## 2023-07-11 DIAGNOSIS — D225 Melanocytic nevi of trunk: Secondary | ICD-10-CM

## 2023-07-11 MED ORDER — TRETINOIN 0.05 % EX CREA: TOPICAL_CREAM | CUTANEOUS | 11 refills | Status: AC

## 2023-07-11 NOTE — Progress Notes (Signed)
 New Patient Visit   Subjective  Amber Hayes is a 68 y.o. female who presents for the following: Skin Cancer Screening and Full Body Skin Exam. No personal Hx of skin cancer or dysplastic nevi.   Lesion of concern on left ankle. Dur: ~6 months. Asymptomatic. Has gotten smaller.   The patient presents for Total-Body Skin Exam (TBSE) for skin cancer screening and mole check. The patient has spots, moles and lesions to be evaluated, some may be new or changing and the patient may have concern these could be cancer.    The following portions of the chart were reviewed this encounter and updated as appropriate: medications, allergies, medical history  Review of Systems:  No other skin or systemic complaints except as noted in HPI or Assessment and Plan.  Objective  Well appearing patient in no apparent distress; mood and affect are within normal limits.  A full examination was performed including scalp, head, eyes, ears, nose, lips, neck, chest, axillae, abdomen, back, buttocks, bilateral upper extremities, bilateral lower extremities, hands, feet, fingers, toes, fingernails, and toenails. All findings within normal limits unless otherwise noted below.   Relevant physical exam findings are noted in the Assessment and Plan.  Left Upper Arm - Posterior x1, L nasal dorsum above alar crease x1 (2) Pink-brown scaly macules        Assessment & Plan   SKIN CANCER SCREENING PERFORMED TODAY.  ACTINIC DAMAGE - Chronic condition, secondary to cumulative UV/sun exposure - diffuse scaly erythematous macules with underlying dyspigmentation - Recommend daily broad spectrum sunscreen SPF 30+ to sun-exposed areas, reapply every 2 hours as needed.  - Staying in the shade or wearing long sleeves, sun glasses (UVA+UVB protection) and wide brim hats (4-inch brim around the entire circumference of the hat) are also recommended for sun protection.  - Call for new or changing lesions.  LENTIGINES,  SEBORRHEIC KERATOSES, HEMANGIOMAS - Benign normal skin lesions - Benign-appearing - Call for any changes  MELANOCYTIC NEVI - Tan-brown and/or pink-flesh-colored symmetric macules and papules - 3 mm flesh papule at left lower back near waistline  - 3 mm medium brown macule at lower abdomen - 4 mm soft flesh papule at ventral left forearm (vs neurofibroma) - Benign appearing on exam today - Observation - Call clinic for new or changing moles - Recommend daily use of broad spectrum spf 30+ sunscreen to sun-exposed areas.    HEMANGIOMA vs Varicosity  Exam: violaceous, partially soft blanchable papule at left anterior ankle. Photo taken today.  Treatment Plan:  Discussed benign nature. Recommend observation. Call for changes. Recommend daily graduated compression hose/stockings- easiest to put on first thing in morning, remove at bedtime.     Varicose Veins/Spider Veins - Dilated blue, purple or red veins at the lower extremities - Reassured - Smaller vessels can be treated by sclerotherapy (a procedure to inject a medicine into the veins to make them disappear) if desired, but the treatment is not covered by insurance. Larger vessels may be covered if symptomatic and we would refer to vascular surgeon if treatment desired.  DERMATOFIBROMA Exam: Firm pink/brown papulenodule with dimple sign at R calf. Treatment Plan: A dermatofibroma is a benign growth possibly related to trauma, such as an insect bite, cut from shaving, or inflamed acne-type bump.  Treatment options to remove include shave or excision with resulting scar and risk of recurrence.  Since benign-appearing and not bothersome, will observe for now.   LENTIGO Exam: 6 x 4 light tan macule at left  nasal dorsum Due to sun exposure Treatment Plan: Benign-appearing, observe. Recommend daily broad spectrum sunscreen SPF 30+ to sun-exposed areas, reapply every 2 hours as needed.  Call for any changes  FACIAL ELASTOSIS Exam:  Rhytides and volume loss.  Treatment Plan: Start Tretinoin 0.05% cream apply pea-sized amount to face at bedtime, wash off in morning.   Topical retinoid medications like tretinoin/Retin-A, adapalene/Differin, tazarotene/Fabior, and Epiduo/Epiduo Forte can cause dryness and irritation when first started. Only apply a pea-sized amount to the entire affected area. Avoid applying it around the eyes, edges of mouth and creases at the nose. If you experience irritation, use a good moisturizer first and/or apply the medicine less often. If you are doing well with the medicine, you can increase how often you use it until you are applying every night. Be careful with sun protection while using this medication as it can make you sensitive to the sun. This medicine should not be used by pregnant women.    Recommend daily broad spectrum sunscreen SPF 30+ to sun-exposed areas, reapply every 2 hours as needed. Call for new or changing lesions.  Staying in the shade or wearing long sleeves, sun glasses (UVA+UVB protection) and wide brim hats (4-inch brim around the entire circumference of the hat) are also recommended for sun protection.    AK (ACTINIC KERATOSIS) (2) Left Upper Arm - Posterior x1, L nasal dorsum above alar crease x1 (2) Vs ISK (L arm)   Actinic keratoses are precancerous spots that appear secondary to cumulative UV radiation exposure/sun exposure over time. They are chronic with expected duration over 1 year. A portion of actinic keratoses will progress to squamous cell carcinoma of the skin. It is not possible to reliably predict which spots will progress to skin cancer and so treatment is recommended to prevent development of skin cancer.  Recommend daily broad spectrum sunscreen SPF 30+ to sun-exposed areas, reapply every 2 hours as needed.  Recommend staying in the shade or wearing long sleeves, sun glasses (UVA+UVB protection) and wide brim hats (4-inch brim around the entire circumference  of the hat). Call for new or changing lesions. Destruction of lesion - Left Upper Arm - Posterior x1, L nasal dorsum above alar crease x1 (2)  Destruction method: cryotherapy   Informed consent: discussed and consent obtained   Lesion destroyed using liquid nitrogen: Yes   Region frozen until ice ball extended beyond lesion: Yes   Outcome: patient tolerated procedure well with no complications   Post-procedure details: wound care instructions given   Additional details:  Prior to procedure, discussed risks of blister formation, small wound, skin dyspigmentation, or rare scar following cryotherapy. Recommend Vaseline ointment to treated areas while healing.   Return in about 1 year (around 07/10/2024) for TBSE, Hx AKs.  I, Jill Parcell, CMA, am acting as scribe for Artemio Larry, MD.   Documentation: I have reviewed the above documentation for accuracy and completeness, and I agree with the above.  Artemio Larry, MD

## 2023-07-11 NOTE — Patient Instructions (Addendum)
 Cryotherapy Aftercare  Wash gently with soap and water everyday.   Apply Vaseline Jelly daily until healed.  Prior to procedure, discussed risks of blister formation, small wound, skin dyspigmentation, or rare scar following cryotherapy. Recommend Vaseline ointment to treated areas while healing.     Start Tretinoin 0.05% cream apply pea-sized amount to face at bedtime, wash off in morning.   Topical retinoid medications like tretinoin/Retin-A, adapalene/Differin, tazarotene/Fabior, and Epiduo/Epiduo Forte can cause dryness and irritation when first started. Only apply a pea-sized amount to the entire affected area. Avoid applying it around the eyes, edges of mouth and creases at the nose. If you experience irritation, use a good moisturizer first and/or apply the medicine less often. If you are doing well with the medicine, you can increase how often you use it until you are applying every night. Be careful with sun protection while using this medication as it can make you sensitive to the sun. This medicine should not be used by pregnant women.     Recommend daily broad spectrum sunscreen SPF 30+ to sun-exposed areas, reapply every 2 hours as needed. Call for new or changing lesions.  Staying in the shade or wearing long sleeves, sun glasses (UVA+UVB protection) and wide brim hats (4-inch brim around the entire circumference of the hat) are also recommended for sun protection.         Melanoma ABCDEs  Melanoma is the most dangerous type of skin cancer, and is the leading cause of death from skin disease.  You are more likely to develop melanoma if you: Have light-colored skin, light-colored eyes, or red or blond hair Spend a lot of time in the sun Tan regularly, either outdoors or in a tanning bed Have had blistering sunburns, especially during childhood Have a close family member who has had a melanoma Have atypical moles or large birthmarks  Early detection of melanoma is key  since treatment is typically straightforward and cure rates are extremely high if we catch it early.   The first sign of melanoma is often a change in a mole or a new dark spot.  The ABCDE system is a way of remembering the signs of melanoma.  A for asymmetry:  The two halves do not match. B for border:  The edges of the growth are irregular. C for color:  A mixture of colors are present instead of an even brown color. D for diameter:  Melanomas are usually (but not always) greater than 6mm - the size of a pencil eraser. E for evolution:  The spot keeps changing in size, shape, and color.  Please check your skin once per month between visits. You can use a small mirror in front and a large mirror behind you to keep an eye on the back side or your body.   If you see any new or changing lesions before your next follow-up, please call to schedule a visit.  Please continue daily skin protection including broad spectrum sunscreen SPF 30+ to sun-exposed areas, reapplying every 2 hours as needed when you're outdoors.   Staying in the shade or wearing long sleeves, sun glasses (UVA+UVB protection) and wide brim hats (4-inch brim around the entire circumference of the hat) are also recommended for sun protection.      Due to recent changes in healthcare laws, you may see results of your pathology and/or laboratory studies on MyChart before the doctors have had a chance to review them. We understand that in some cases there may  be results that are confusing or concerning to you. Please understand that not all results are received at the same time and often the doctors may need to interpret multiple results in order to provide you with the best plan of care or course of treatment. Therefore, we ask that you please give us  2 business days to thoroughly review all your results before contacting the office for clarification. Should we see a critical lab result, you will be contacted sooner.   If You Need  Anything After Your Visit  If you have any questions or concerns for your doctor, please call our main line at [phone] and press option 4 to reach your doctor's medical assistant. If no one answers, please leave a voicemail as directed and we will return your call as soon as possible. Messages left after 4 pm will be answered the following business day.   You may also send us  a message via MyChart. We typically respond to MyChart messages within 1-2 business days.  For prescription refills, please ask your pharmacy to contact our office. Our fax number is [phone].  If you have an urgent issue when the clinic is closed that cannot wait until the next business day, you can page your doctor at the number below.    Please note that while we do our best to be available for urgent issues outside of office hours, we are not available 24/7.   If you have an urgent issue and are unable to reach us , you may choose to seek medical care at your doctor's office, retail clinic, urgent care center, or emergency room.  If you have a medical emergency, please immediately call 911 or go to the emergency department.  Pager Numbers  - Dr. Bary Likes: [phone]  - Dr. Annette Barters: [phone]  - Dr. Felipe Horton: [phone]   In the event of inclement weather, please call our main line at [phone] for an update on the status of any delays or closures.  Dermatology Medication Tips: Please keep the boxes that topical medications come in in order to help keep track of the instructions about where and how to use these. Pharmacies typically print the medication instructions only on the boxes and not directly on the medication tubes.   If your medication is too expensive, please contact our office at [phone] option 4 or send us  a message through MyChart.   We are unable to tell what your co-pay for medications will be in advance as this is different depending on your insurance coverage. However, we  may be able to find a substitute medication at lower cost or fill out paperwork to get insurance to cover a needed medication.   If a prior authorization is required to get your medication covered by your insurance company, please allow us  1-2 business days to complete this process.  Drug prices often vary depending on where the prescription is filled and some pharmacies may offer cheaper prices.  The website www.goodrx.com contains coupons for medications through different pharmacies. The prices here do not account for what the cost may be with help from insurance (it may be cheaper with your insurance), but the website can give you the price if you did not use any insurance.  - You can print the associated coupon and take it with your prescription to the pharmacy.  - You may also stop by our office during regular business hours and pick up a GoodRx coupon card.  - If you need your prescription sent electronically to  a different pharmacy, notify our office through [facility] or by phone at [phone] option 4.     Si Usted Necesita Algo Despus de Su Visita  Tambin puede enviarnos un mensaje a travs de Clinical cytogeneticist. Por lo general respondemos a los mensajes de MyChart en el transcurso de 1 a 2 das hbiles.  Para renovar recetas, por favor pida a su farmacia que se ponga en contacto con nuestra oficina. Franz Jacks de fax es [place] [phone].  Si tiene un asunto urgente cuando la clnica est cerrada y que no puede esperar hasta el siguiente da hbil, puede llamar/localizar a su doctor(a) al nmero que aparece a continuacin.   Por favor, tenga en cuenta que aunque hacemos todo lo posible para estar disponibles para asuntos urgentes fuera del horario de [place], no estamos disponibles las 24 horas del da, los 7 [address]  [address] de la [place].   Si tiene un problema urgente y no puede comunicarse con nosotros, puede optar por buscar atencin mdica  en el consultorio de su doctor(a), en una  clnica privada, en un centro de atencin urgente o en una sala de emergencias.  Si tiene Engineer, drilling, por favor llame inmediatamente al 911 o vaya a la sala de emergencias.  Nmeros de bper  - Dr. Bary Likes: [phone]  - Dra. Annette Barters: [phone]  - Dr. Felipe Horton: [phone]   En caso de inclemencias del tiempo, por favor llame a Lajuan Pila principal al [phone] para una actualizacin sobre el [place] de cualquier retraso o cierre.  Consejos para la medicacin en dermatologa: Por favor, guarde las cajas en las que vienen los medicamentos de uso tpico para ayudarle a seguir las instrucciones sobre dnde y cmo usarlos. Las farmacias generalmente imprimen las instrucciones del medicamento slo en las cajas y no directamente en los tubos del [place].   Si su medicamento es muy caro, por favor, pngase en contacto con Bettyjane Brunet llamando al [phone] y presione la opcin 4 o envenos un mensaje a travs de Clinical cytogeneticist.   No podemos decirle cul ser su copago por los medicamentos por adelantado ya que esto es diferente dependiendo de la cobertura de su seguro. Sin embargo, es posible que podamos encontrar un medicamento sustituto a Audiological scientist un formulario para que el seguro cubra el medicamento que se considera necesario.   Si se requiere una autorizacin previa para que su compaa de seguros [place] su medicamento, por favor permtanos de 1 a 2 das hbiles para completar este proceso.  Los precios de los medicamentos varan con frecuencia dependiendo del Environmental consultant de dnde se surte la receta y alguna farmacias pueden ofrecer precios ms baratos.  El sitio web www.goodrx.com tiene cupones para medicamentos de Health and safety inspector. Los precios aqu no tienen en cuenta lo que podra costar con la ayuda del seguro (puede ser ms barato con su seguro), pero el sitio web puede darle el precio si no utiliz Tourist information centre manager.  - Puede imprimir el cupn correspondiente  y llevarlo con su receta a la farmacia.  - Tambin puede pasar por nuestra oficina durante el horario de atencin regular y Education officer, museum una tarjeta de cupones de GoodRx.  - Si necesita que su receta se enve electrnicamente a una farmacia diferente, informe a nuestra oficina a travs de MyChart de [place] o por telfono llamando al [phone] y presione la opcin 4.

## 2023-08-07 ENCOUNTER — Encounter: Payer: Self-pay | Admitting: Family Medicine

## 2023-08-09 NOTE — Telephone Encounter (Signed)
 Patient scheduled.

## 2023-08-09 NOTE — Telephone Encounter (Signed)
 Can you find her an appt with me to evaluate?

## 2023-08-10 ENCOUNTER — Ambulatory Visit (INDEPENDENT_AMBULATORY_CARE_PROVIDER_SITE_OTHER): Admitting: Family Medicine

## 2023-08-10 ENCOUNTER — Encounter: Payer: Self-pay | Admitting: Family Medicine

## 2023-08-10 VITALS — BP 146/88 | HR 62 | Resp 16 | Wt 129.3 lb

## 2023-08-10 DIAGNOSIS — Z87898 Personal history of other specified conditions: Secondary | ICD-10-CM | POA: Diagnosis not present

## 2023-08-10 DIAGNOSIS — K219 Gastro-esophageal reflux disease without esophagitis: Secondary | ICD-10-CM

## 2023-08-10 DIAGNOSIS — R197 Diarrhea, unspecified: Secondary | ICD-10-CM

## 2023-08-10 DIAGNOSIS — R1013 Epigastric pain: Secondary | ICD-10-CM | POA: Diagnosis not present

## 2023-08-10 MED ORDER — OMEPRAZOLE 20 MG PO CPDR
20.0000 mg | DELAYED_RELEASE_CAPSULE | Freq: Every day | ORAL | 3 refills | Status: DC
Start: 2023-08-10 — End: 2023-09-11

## 2023-08-10 NOTE — Progress Notes (Signed)
 Acute Office Visit  Subjective:     Patient ID: Amber Hayes, female    DOB: November 21, 1955, 68 y.o.   MRN: [id number]  Chief Complaint  Patient presents with   GI Problem    Indigestion  Frequency: x six weeks    Amber Hayes is a 68 yo female with a history of chronic constipation and 30 year history of extensive alcohol use who presents to the clinic acutely for concerns regarding epigastric pressure. On interview, she states that her symptoms started approximately 8-10 years ago with a recent worsening that caused her to come in. She describes her epigastric pressure as a band wrapping around to her back with a gnawing feeling in her esophagus. She states that the band of pressure is always there but is occasionally worse with fatty foods such as venison or butter. She feels better when leaning forward to burp. She endorses a recent stool change with diarrhea but denies any difference in smell. She denies any pain, nausea, vomiting, and constipation. She also denies significant weight loss or weight gain, blood in her stool or swell, or recent extended use of NSAIDS/acetaminophen. She reports a 30-year history of extensive drinking of both wine and vodka (approximately one bottle of wine per night) with sobriety for the last 16 years. She denies any other concerns today.    Review of Systems  Constitutional:  Negative for chills, fever and weight loss.  Respiratory:  Negative for cough, hemoptysis, shortness of breath and wheezing.   Cardiovascular:  Negative for chest pain, palpitations and leg swelling.  Gastrointestinal:  Positive for abdominal pain (Describes it as abdominal pressure), diarrhea and heartburn. Negative for blood in stool, constipation, melena, nausea and vomiting.  Genitourinary:  Negative for dysuria, frequency and urgency.        Objective:    BP (!) 146/88 (BP Location: Right Arm, Patient Position: Sitting, Cuff Size: Normal)   Pulse 62   Resp 16   Wt 129  lb 4.8 oz (58.7 kg)   BMI 19.66 kg/m     Physical Exam Vitals reviewed.  Constitutional:      General: She is not in acute distress.    Appearance: Normal appearance. She is not ill-appearing.  HENT:     Head: Normocephalic.     Right Ear: Tympanic membrane, ear canal and external ear normal.     Left Ear: Tympanic membrane, ear canal and external ear normal.     Nose: Nose normal.     Mouth/Throat:     Mouth: Mucous membranes are moist.     Pharynx: Oropharynx is clear.  Eyes:     General: No scleral icterus.    Conjunctiva/sclera: Conjunctivae normal.     Pupils: Pupils are equal, round, and reactive to light.  Cardiovascular:     Rate and Rhythm: Normal rate and regular rhythm.     Pulses: Normal pulses.  Pulmonary:     Effort: Pulmonary effort is normal. No respiratory distress.     Breath sounds: Normal breath sounds. No wheezing.  Abdominal:     General: Abdomen is flat. Bowel sounds are normal. There is no distension.     Palpations: Abdomen is soft.     Tenderness: There is no abdominal tenderness. There is no guarding.  Musculoskeletal:     Cervical back: Normal range of motion.     Right lower leg: No edema.     Left lower leg: No edema.  Lymphadenopathy:     Cervical: No  cervical adenopathy.  Skin:    General: Skin is warm and dry.     Capillary Refill: Capillary refill takes less than 2 seconds.  Neurological:     Mental Status: She is alert and oriented to person, place, and time.     No results found for any visits on 08/10/23.      Assessment & Plan:   Problem List Items Addressed This Visit       Other   History of alcohol use disorder   Other Visit Diagnoses       Gastroesophageal reflux disease, unspecified whether esophagitis present    -  Primary   Relevant Medications   omeprazole  (PRILOSEC) 20 MG capsule   Other Relevant Orders   Comprehensive metabolic panel with GFR   Lipase   H. pylori breath test     Diarrhea, unspecified  type         Epigastric abdominal pain       Relevant Orders   Lipase   H. pylori breath test       Meds ordered this encounter  Medications   omeprazole  (PRILOSEC) 20 MG capsule    Sig: Take 1 capsule (20 mg total) by mouth daily.    Dispense:  30 capsule    Refill:  3   Epigastric Pressure Patient presents with a constellation of symptoms consistent with GERD including insidious course, epigastric pressure, association with food, and esophageal irritation. - Start omeprazole  20 mg daily - Order CMP, Lipase, and H. Pylori breath test - If symptoms persist at follow up or labs result elevated (lipase, bilirubin, LFTs), can consider ordering ultrasound and referral to GI for possible gallstones or chronic pancreatitis   Return in about 4 weeks (around 09/07/2023) for abd pain follow up.  Elia LULLA Blanch, Medical Student   Patient seen along with MS3 student, Elia Blanch. I personally evaluated this patient along with the student, and verified all aspects of the history, physical exam, and medical decision making as documented by the student. I agree with the student's documentation and have made all necessary edits.  Jihaad Bruschi, Jon HERO, MD, MPH [facility]

## 2023-08-12 LAB — COMPREHENSIVE METABOLIC PANEL WITH GFR
ALT: 16 IU/L (ref 0–32)
AST: 23 IU/L (ref 0–40)
Albumin: 4.3 g/dL (ref 3.9–4.9)
Alkaline Phosphatase: 92 IU/L (ref 44–121)
BUN/Creatinine Ratio: 14 (ref 12–28)
BUN: 12 mg/dL (ref 8–27)
Bilirubin Total: 0.5 mg/dL (ref 0.0–1.2)
CO2: 23 mmol/L (ref 20–29)
Calcium: 9.2 mg/dL (ref 8.7–10.3)
Chloride: 99 mmol/L (ref 96–106)
Creatinine, Ser: 0.84 mg/dL (ref 0.57–1.00)
Globulin, Total: 2.1 g/dL (ref 1.5–4.5)
Glucose: 85 mg/dL (ref 70–99)
Potassium: 4.7 mmol/L (ref 3.5–5.2)
Sodium: 138 mmol/L (ref 134–144)
Total Protein: 6.4 g/dL (ref 6.0–8.5)
eGFR: 75 mL/min/1.73 (ref >59)

## 2023-08-12 LAB — H. PYLORI BREATH TEST

## 2023-08-12 LAB — LIPASE: Lipase: 54 U/L (ref 14–72)

## 2023-08-14 ENCOUNTER — Ambulatory Visit: Payer: Self-pay | Admitting: Family Medicine

## 2023-09-06 DIAGNOSIS — M810 Age-related osteoporosis without current pathological fracture: Secondary | ICD-10-CM | POA: Diagnosis not present

## 2023-09-07 ENCOUNTER — Ambulatory Visit: Admitting: Family Medicine

## 2023-09-11 ENCOUNTER — Encounter: Payer: Self-pay | Admitting: Family Medicine

## 2023-09-11 ENCOUNTER — Ambulatory Visit (INDEPENDENT_AMBULATORY_CARE_PROVIDER_SITE_OTHER): Admitting: Family Medicine

## 2023-09-11 VITALS — BP 132/66 | HR 66 | Ht 68.0 in | Wt 129.0 lb

## 2023-09-11 DIAGNOSIS — K219 Gastro-esophageal reflux disease without esophagitis: Secondary | ICD-10-CM | POA: Diagnosis not present

## 2023-09-11 MED ORDER — OMEPRAZOLE 40 MG PO CPDR: 40.0000 mg | DELAYED_RELEASE_CAPSULE | Freq: Every day | ORAL | 2 refills | Status: DC

## 2023-09-11 NOTE — Progress Notes (Signed)
 Established patient visit   Patient: Amber Hayes   DOB: 1955-11-12   68 y.o. Female  MRN: [id number] Visit Date: 09/11/2023  Today's healthcare provider: Jon Eva, MD   Chief Complaint  Patient presents with   Follow-up    Abdominal, she is not feeling the pain its more so a hungry feeling but she is not hungry    Subjective    HPI HPI     Follow-up    Additional comments: Abdominal, she is not feeling the pain its more so a hungry feeling but she is not hungry       Last edited by Thelbert Eulalio HERO, CMA on 09/11/2023  3:47 PM.       Discussed the use of AI scribe software for clinical note transcription with the patient, who gave verbal consent to proceed.  History of Present Illness   Amber Hayes is a 68 year old female who presents with persistent gnawing abdominal discomfort despite treatment with Prilosec.  She experiences a persistent gnawing sensation in her abdomen, described as a feeling of 'gnawing hunger' unrelated to actual hunger. This discomfort is ongoing and present even during the visit. Prilosec 20 mg once daily provides only partial relief. The sensation is uncomfortable but not painful.  She has been mindful of her diet, consuming a lot of yogurt, which she feels has helped somewhat. She has never had to worry about her diet before this issue arose.  A previous test for H. pylori was negative. She has not experienced any significant pain, fever, or weight loss.       Medications: Outpatient Medications Prior to Visit  Medication Sig   busPIRone  (BUSPAR ) 7.5 MG tablet Take 1 tablet by mouth twice daily   citalopram  (CELEXA ) 20 MG tablet TAKE 1 & 1/2 (ONE & ONE-HALF) TABLETS BY MOUTH ONCE DAILY   Iron, Ferrous Sulfate, 325 (65 Fe) MG TABS Take by mouth.   tretinoin  (RETIN-A ) 0.05 % cream Apply pea-sized amount to face at bedtime, wash off in morning.   [DISCONTINUED] Cholecalciferol (D 1000) 25 MCG (1000 UT) capsule Take by  mouth.   [DISCONTINUED] Cyanocobalamin  (VITAMIN B 12 PO) Take by mouth. gummy   [DISCONTINUED] omeprazole  (PRILOSEC) 20 MG capsule Take 1 capsule (20 mg total) by mouth daily.   No facility-administered medications prior to visit.    Review of Systems     Objective    BP 132/66 (BP Location: Left Arm, Patient Position: Sitting, Cuff Size: Normal)   Pulse 66   Ht 5' 8 (1.727 m)   Wt 129 lb (58.5 kg)   SpO2 99%   BMI 19.61 kg/m    Physical Exam Vitals reviewed.  Constitutional:      General: She is not in acute distress.    Appearance: Normal appearance. She is well-developed. She is not diaphoretic.  HENT:     Head: Normocephalic and atraumatic.  Eyes:     General: No scleral icterus.    Conjunctiva/sclera: Conjunctivae normal.  Neck:     Thyroid : No thyromegaly.  Cardiovascular:     Rate and Rhythm: Normal rate and regular rhythm.     Heart sounds: Normal heart sounds. No murmur heard. Pulmonary:     Effort: Pulmonary effort is normal. No respiratory distress.     Breath sounds: Normal breath sounds. No wheezing, rhonchi or rales.  Abdominal:     General: There is no distension.     Palpations: Abdomen is soft.  Tenderness: There is no abdominal tenderness.  Musculoskeletal:     Cervical back: Neck supple.     Right lower leg: No edema.     Left lower leg: No edema.  Lymphadenopathy:     Cervical: No cervical adenopathy.  Skin:    General: Skin is warm and dry.     Findings: No rash.  Neurological:     Mental Status: She is alert and oriented to person, place, and time. Mental status is at baseline.  Psychiatric:        Mood and Affect: Mood normal.        Behavior: Behavior normal.      No results found for any visits on 09/11/23.  Assessment & Plan     Problem List Items Addressed This Visit   None Visit Diagnoses       Gastroesophageal reflux disease, unspecified whether esophagitis present    -  Primary   Relevant Medications   omeprazole   (PRILOSEC) 40 MG capsule   Other Relevant Orders   Ambulatory referral to Gastroenterology          Gastroesophageal reflux disease without esophagitis Reports a gnawing sensation resembling hunger, partially relieved by omeprazole  20 mg daily. Describes sensation as discomfort rather than pain. H. pylori test negative. Current treatment not fully effective, suggesting need for further evaluation. Differential diagnosis includes possible ulcerations, though absence of pain makes this less likely. - Increase omeprazole  to 40 mg daily, taken in the morning before eating. - Refer to gastroenterology for further evaluation, including possible endoscopy. - Continue current dietary modifications, including yogurt consumption.       Return if symptoms worsen or fail to improve.       Jon Eva, MD  [facility] [phone] (phone) [phone] (fax)  [facility]

## 2023-09-12 ENCOUNTER — Ambulatory Visit: Payer: Medicare Other

## 2023-09-12 DIAGNOSIS — Z0000 Encounter for general adult medical examination without abnormal findings: Secondary | ICD-10-CM

## 2023-09-12 NOTE — Progress Notes (Signed)
 Subjective:   Amber Hayes is a 68 y.o. who presents for a Medicare Wellness preventive visit.  As a reminder, Annual Wellness Visits don't include a physical exam, and some assessments may be limited, especially if this visit is performed virtually. We may recommend an in-person follow-up visit with your provider if needed.  Visit Complete: Virtual I connected with  Amber Hayes on 09/12/23 by a audio enabled telemedicine application and verified that I am speaking with the correct person using two identifiers.  Patient Location: Home  Provider Location: Office/Clinic  I discussed the limitations of evaluation and management by telemedicine. The patient expressed understanding and agreed to proceed.  Vital Signs: Because this visit was a virtual/telehealth visit, some criteria may be missing or patient reported. Any vitals not documented were not able to be obtained and vitals that have been documented are patient reported.  VideoDeclined- This patient declined Librarian, academic. Therefore the visit was completed with audio only.  Persons Participating in Visit: Patient.  AWV Questionnaire: No: Patient Medicare AWV questionnaire was not completed prior to this visit.  Cardiac Risk Factors include: advanced age (>77men, >75 women);dyslipidemia     Objective:    Today's Vitals   09/12/23 0854  PainSc: 0-No pain   There is no height or weight on file to calculate BMI.     09/12/2023    8:58 AM 09/07/2022    9:01 AM 04/10/2019   11:07 AM 02/13/2019    9:11 AM 10/19/2018    7:58 AM 07/04/2016    3:10 PM 05/18/2015    8:13 AM  Advanced Directives  Does Patient Have a Medical Advance Directive? No Yes Yes Yes Yes Yes  Yes   Type of Furniture conservator/restorer;Living will Healthcare Power of [place];Living will Healthcare Power of [place];Living will  Healthcare Power of [place];Living will Living will;Healthcare Power of  Attorney   Does patient want to make changes to medical advance directive?   No - Patient declined No - Patient declined     Copy of Healthcare Power of Attorney in Chart?   No - copy requested Yes - validated most recent copy scanned in chart (See row information)     Would patient like information on creating a medical advance directive? No - Patient declined           Data saved with a previous flowsheet row definition    Current Medications (verified) Outpatient Encounter Medications as of 09/12/2023  Medication Sig   busPIRone  (BUSPAR ) 7.5 MG tablet Take 1 tablet by mouth twice daily   citalopram  (CELEXA ) 20 MG tablet TAKE 1 & 1/2 (ONE & ONE-HALF) TABLETS BY MOUTH ONCE DAILY   denosumab  (PROLIA ) 60 MG/ML SOSY injection Inject 60 mg into the skin every 6 (six) months.   Iron, Ferrous Sulfate, 325 (65 Fe) MG TABS Take by mouth.   omeprazole  (PRILOSEC) 40 MG capsule Take 1 capsule (40 mg total) by mouth daily.   tretinoin  (RETIN-A ) 0.05 % cream Apply pea-sized amount to face at bedtime, wash off in morning.   No facility-administered encounter medications on file as of 09/12/2023.    Allergies (verified) Patient has no known allergies.   History: Past Medical History:  Diagnosis Date   Anxiety 2009   Cataract 2021   Had cataract surgery   Depression 1998   Osteoporosis    Past Surgical History:  Procedure Laterality Date   AUGMENTATION MAMMAPLASTY Bilateral 2003   CATARACT EXTRACTION W/PHACO  Right 02/13/2019   Procedure: CATARACT EXTRACTION PHACO AND INTRAOCULAR LENS PLACEMENT (IOC) RIGHT 3.81 00:38.5 9.9%;  Surgeon: Mittie Gaskin, MD;  Location: [facility];  Service: Ophthalmology;  Laterality: Right;   CATARACT EXTRACTION W/PHACO Left 04/10/2019   Procedure: CATARACT EXTRACTION PHACO AND INTRAOCULAR LENS PLACEMENT (IOC) LEFT 2.02 00:27.3 7.4%;  Surgeon: Mittie Gaskin, MD;  Location: [facility];  Service: Ophthalmology;  Laterality: Left;    COLONOSCOPY WITH PROPOFOL  N/A 10/19/2018   Procedure: COLONOSCOPY WITH PROPOFOL ;  Surgeon: Therisa Bi, MD;  Location: [facility];  Service: Gastroenterology;  Laterality: N/A;   COSMETIC SURGERY  1998   Breast implants   EYE SURGERY  1999   Lasik   EYE SURGERY  2017   Torn Retina   Family History  Problem Relation Age of Onset   Alzheimer's disease Mother    Miscarriages / Stillbirths Mother    Heart attack Father    Alcohol abuse Father    Diabetes Father    Heart disease Father    Hypertension Sister    Bronchitis Sister    Neurologic Disorder Sister        global transient amnesia x 2   Breast cancer Other    Social History   Socioeconomic History   Marital status: Married    Spouse name: Amber Hayes   Number of children: 2   Years of education: undergrad   Highest education level: Bachelor's degree (e.g., BA, AB, BS)  Occupational History   Occupation: process applications; 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    Employer: [employer]    Comment: Part time; seasonal  Tobacco Use   Smoking status: Former    Current packs/day: 0.00    Average packs/day: 0.5 packs/day for 15.0 years (7.5 ttl pk-yrs)    Types: Cigarettes    Start date: 01/23/1989    Quit date: 01/24/2004    Years since quitting: 19.6   Smokeless tobacco: Never  Vaping Use   Vaping status: Never Used  Substance and Sexual Activity   Alcohol use: No   Drug use: No   Sexual activity: Not Currently    Birth control/protection: Post-menopausal  Other Topics Concern   Not on file  Social History Narrative   Not on file   Social Drivers of Health   Financial Resource Strain: Low Risk  (09/12/2023)   Overall Financial Resource Strain (CARDIA)    Difficulty of Paying Living Expenses: Not hard at all  Food Insecurity: No Food Insecurity (09/12/2023)   Hunger Vital Sign    Worried About Running Out of Food in the Last Year: Never true    Ran Out of Food in the Last Year: Never true  Transportation Needs: No  Transportation Needs (09/12/2023)   PRAPARE - Administrator, Civil Service (Medical): No    Lack of Transportation (Non-Medical): No  Physical Activity: Sufficiently Active (09/12/2023)   Exercise Vital Sign    Days of Exercise per Week: 5 days    Minutes of Exercise per Session: 60 min  Stress: No Stress Concern Present (09/12/2023)   [employer] of Occupational Health - Occupational Stress Questionnaire    Feeling of Stress: Not at all  Social Connections: Socially Integrated (09/12/2023)   Social Connection and Isolation Panel    Frequency of Communication with Friends and Family: More than three times a week    Frequency of Social Gatherings with Friends and Family: More than three times a week    Attends Religious Services: More than 4  times per year    Active Member of Clubs or Organizations: Yes    Attends Banker Meetings: More than 4 times per year    Marital Status: Married    Tobacco Counseling Counseling given: Not Answered    Clinical Intake:  Pre-visit preparation completed: Yes  Pain : No/denies pain Pain Score: 0-No pain     BMI - recorded: 19.6 Nutritional Status: BMI of 19-24  Normal Nutritional Risks: None Diabetes: No  Lab Results  Component Value Date   HGBA1C 5.4 07/05/2016     How often do you need to have someone help you when you read instructions, pamphlets, or other written materials from your doctor or pharmacy?: 1 - Never  Interpreter Needed?: No  Information entered by :: Amber DAS, LPN   Activities of Daily Living    09/12/2023    9:01 AM 09/08/2023   11:32 AM  In your present state of health, do you have any difficulty performing the following activities:  Hearing? 0 0  Vision? 0 0  Difficulty concentrating or making decisions? 0 0  Walking or climbing stairs? 0 0  Dressing or bathing? 0 0  Doing errands, shopping? 0 0  Preparing Food and eating ? N N  Using the Toilet? N N  In the past six  months, have you accidently leaked urine? Y Y  Do you have problems with loss of bowel control? N N  Managing your Medications? N N  Managing your Finances? N N  Housekeeping or managing your Housekeeping? N N    Patient Care Team: Myrla Jon HERO, MD as PCP - General (Family Medicine) Mittie Gaskin, MD as Referring Physician (Ophthalmology)  I have updated your Care Teams any recent Medical Services you may have received from other providers in the past year.     Assessment:   This is a routine wellness examination for Amber Hayes.  Hearing/Vision screen Hearing Screening - Comments:: NO AIDS Vision Screening - Comments:: READERS, HAS HAD CATARACT SGY- DR.BRASINGTON   Goals Addressed             This Visit's Progress    DIET - EAT MORE FRUITS AND VEGETABLES         Depression Screen     09/12/2023    8:56 AM 09/11/2023    3:48 PM 08/10/2023    9:38 AM 04/24/2023    1:11 PM 02/07/2023   10:56 AM 09/07/2022    9:00 AM 01/10/2022    3:25 PM  PHQ 2/9 Scores  PHQ - 2 Score 0 0 0 0 0 0 0  PHQ- 9 Score 0 1 2    1     Fall Risk     09/12/2023    8:59 AM 09/08/2023   11:32 AM 04/24/2023    1:11 PM 02/07/2023   10:56 AM 09/03/2022    9:15 AM  Fall Risk   Falls in the past year? 1 1 1  0 0  Comment DOG PULLED HER DOWN      Number falls in past yr: 0 0 0 0 0  Injury with Fall? 1 1 1  0 0  Risk for fall due to :    No Fall Risks No Fall Risks  Follow up Falls evaluation completed;Falls prevention discussed   Falls evaluation completed Education provided;Falls prevention discussed    MEDICARE RISK AT HOME:  Medicare Risk at Home Any stairs in or around the home?: No If so, are there any without handrails?: No  Home free of loose throw rugs in walkways, pet beds, electrical cords, etc?: Yes Adequate lighting in your home to reduce risk of falls?: Yes Life alert?: No Use of a cane, walker or w/c?: No Grab bars in the bathroom?: Yes Shower chair or bench in shower?:  No Elevated toilet seat or a handicapped toilet?: No  TIMED UP AND GO:  Was the test performed?  No  Cognitive Function: 6CIT completed        09/12/2023    9:08 AM 09/07/2022    9:02 AM 01/10/2022    3:26 PM  6CIT Screen  What Year? 0 points 0 points 0 points  What month? 0 points 0 points 0 points  What time? 0 points 0 points 0 points  Count back from 20 0 points 0 points 0 points  Months in reverse 0 points 0 points 0 points  Repeat phrase 0 points 0 points 0 points  Total Score 0 points 0 points 0 points    Immunizations Immunization History  Administered Date(s) Administered   PFIZER(Purple Top)SARS-COV-2 Vaccination 03/29/2019, 04/18/2019, 01/05/2020   Pneumococcal Conjugate-13 12/25/2019   Pneumococcal Polysaccharide-23 11/08/2018   Tdap 07/13/2013   Zoster Recombinant(Shingrix) 02/24/2022, 07/29/2022    Screening Tests Health Maintenance  Topic Date Due   COVID-19 Vaccine (4 - 2024-25 season) 09/25/2022   DTaP/Tdap/Td (2 - Td or Tdap) 07/14/2023   INFLUENZA VACCINE  04/23/2024 (Originally 08/25/2023)   DEXA SCAN  02/17/2024   MAMMOGRAM  05/11/2024   Medicare Annual Wellness (AWV)  09/11/2024   Colonoscopy  10/18/2028   Pneumococcal Vaccine: 50+ Years  Completed   Hepatitis C Screening  Completed   Zoster Vaccines- Shingrix  Completed   HPV VACCINES  Aged Out   Meningococcal B Vaccine  Aged Out   Pneumococcal Vaccine  Discontinued    Health Maintenance  Health Maintenance Due  Topic Date Due   COVID-19 Vaccine (4 - 2024-25 season) 09/25/2022   DTaP/Tdap/Td (2 - Td or Tdap) 07/14/2023   Health Maintenance Items Addressed: UP TO DATE ON MAMMOGRAM, COLONOSCOPY & BDS; NEEDS TDAP & COVID  Additional Screening:  Vision Screening: Recommended annual ophthalmology exams for early detection of glaucoma and other disorders of the eye. Would you like a referral to an eye doctor? No    Dental Screening: Recommended annual dental exams for proper oral  hygiene  Community Resource Referral / Chronic Care Management: CRR required this visit?  No   CCM required this visit?  No   Plan:    I have personally reviewed and noted the following in the patient's chart:   Medical and social history Use of alcohol, tobacco or illicit drugs  Current medications and supplements including opioid prescriptions. Patient is not currently taking opioid prescriptions. Functional ability and status Nutritional status Physical activity Advanced directives List of other physicians Hospitalizations, surgeries, and ER visits in previous 12 months Vitals Screenings to include cognitive, depression, and falls Referrals and appointments  In addition, I have reviewed and discussed with patient certain preventive protocols, quality metrics, and best practice recommendations. A written personalized care plan for preventive services as well as general preventive health recommendations were provided to patient.   Amber GORMAN Das, LPN   [id number]/[id number]   After Visit Summary: (MyChart) Due to this being a telephonic visit, the after visit summary with patients personalized plan was offered to patient via MyChart   Notes: Nothing significant to report at this time.

## 2023-09-12 NOTE — Patient Instructions (Signed)
 Ms. Amber Hayes , Thank you for taking time out of your busy schedule to complete your Annual Wellness Visit with me. I enjoyed our conversation and look forward to speaking with you again next year. I, as well as your care team,  appreciate your ongoing commitment to your health goals. Please review the following plan we discussed and let me know if I can assist you in the future.   Follow up Visits: 09/17/24 @ 8:50 AM BY PHONE We will see or speak with you next year for your Next Medicare AWV with our clinical staff Have you seen your provider in the last 6 months (3 months if uncontrolled diabetes)? Yes  Clinician Recommendations:  Aim for 30 minutes of exercise or brisk walking, 6-8 glasses of water, and 5 servings of fruits and vegetables each day. Take care!      This is a list of the screenings recommended for you:  Health Maintenance  Topic Date Due   COVID-19 Vaccine (4 - 2024-25 season) 09/25/2022   DTaP/Tdap/Td vaccine (2 - Td or Tdap) 07/14/2023   Flu Shot  04/23/2024*   DEXA scan (bone density measurement)  02/17/2024   Mammogram  05/11/2024   Medicare Annual Wellness Visit  09/11/2024   Colon Cancer Screening  10/18/2028   Pneumococcal Vaccine for age over 81  Completed   Hepatitis C Screening  Completed   Zoster (Shingles) Vaccine  Completed   HPV Vaccine  Aged Out   Meningitis B Vaccine  Aged Out   Pneumococcal Vaccine  Discontinued  *Topic was postponed. The date shown is not the original due date.    Advanced directives: (ACP Link)Information on Advanced Care Planning can be found at [place]  Secretary of [facility] Advance Health Care Directives Advance Health Care Directives. [email or web address]  Advance Care Planning is important because it:  [x]  Makes sure you receive the medical care that is consistent with your values, goals, and preferences  [x]  It provides guidance to your family and loved ones and reduces their decisional burden about whether or not they are making the  right decisions based on your wishes.  Follow the link provided in your after visit summary or read over the paperwork we have mailed to you to help you started getting your Advance Directives in place. If you need assistance in completing these, please reach out to us  so that we can help you!

## 2023-09-15 ENCOUNTER — Other Ambulatory Visit: Payer: Self-pay | Admitting: Family Medicine

## 2023-09-15 NOTE — Telephone Encounter (Signed)
 Requested Prescriptions  Pending Prescriptions Disp Refills   busPIRone  (BUSPAR ) 7.5 MG tablet [Pharmacy Med Name: busPIRone  HCl 7.5 MG Oral Tablet] 180 tablet 1    Sig: Take 1 tablet by mouth twice daily     Psychiatry: Anxiolytics/Hypnotics - Non-controlled Passed - 09/15/2023  5:02 PM      Passed - Valid encounter within last 12 months    Recent Outpatient Visits           4 days ago Gastroesophageal reflux disease, unspecified whether esophagitis present   [facility] [place], Jon HERO, MD   1 month ago Gastroesophageal reflux disease, unspecified whether esophagitis present   [facility] [place], Jon HERO, MD   4 months ago Lethargy   [facility] [place] Nancyann BRAVO, MD       Future Appointments             In 4 months Bacigalupo, Jon HERO, MD [facility]

## 2023-10-26 DIAGNOSIS — H33312 Horseshoe tear of retina without detachment, left eye: Secondary | ICD-10-CM | POA: Diagnosis not present

## 2023-10-26 DIAGNOSIS — H43813 Vitreous degeneration, bilateral: Secondary | ICD-10-CM | POA: Diagnosis not present

## 2023-10-26 DIAGNOSIS — Z961 Presence of intraocular lens: Secondary | ICD-10-CM | POA: Diagnosis not present

## 2023-11-10 ENCOUNTER — Other Ambulatory Visit: Payer: Self-pay | Admitting: Medical Genetics

## 2023-11-10 DIAGNOSIS — Z006 Encounter for examination for normal comparison and control in clinical research program: Secondary | ICD-10-CM

## 2023-12-08 DIAGNOSIS — Z961 Presence of intraocular lens: Secondary | ICD-10-CM | POA: Diagnosis not present

## 2023-12-08 DIAGNOSIS — H35372 Puckering of macula, left eye: Secondary | ICD-10-CM | POA: Diagnosis not present

## 2023-12-08 DIAGNOSIS — H43813 Vitreous degeneration, bilateral: Secondary | ICD-10-CM | POA: Diagnosis not present

## 2023-12-08 DIAGNOSIS — H33312 Horseshoe tear of retina without detachment, left eye: Secondary | ICD-10-CM | POA: Diagnosis not present

## 2023-12-16 ENCOUNTER — Other Ambulatory Visit: Payer: Self-pay | Admitting: Family Medicine

## 2023-12-18 ENCOUNTER — Telehealth: Payer: Self-pay | Admitting: Medical Genetics

## 2023-12-18 DIAGNOSIS — Z006 Encounter for examination for normal comparison and control in clinical research program: Secondary | ICD-10-CM

## 2023-12-18 LAB — GENECONNECT MOLECULAR SCREEN

## 2023-12-20 DIAGNOSIS — M542 Cervicalgia: Secondary | ICD-10-CM | POA: Diagnosis not present

## 2023-12-22 ENCOUNTER — Telehealth: Payer: Self-pay

## 2023-12-25 NOTE — Telephone Encounter (Signed)
 [place] [facility]  12/25/2023 3:34 PM  Confirmed I was speaking with Amber Hayes [id number] by using name and DOB. Informed participant the reason for this call is to follow-up on a recent sample the participant provided at one of the [facility] lab locations. Informed participant the test was not able to be completed with this sample and apologized for the inconvenience. Participant was requested to provide a new sample at one of our participating labs at no cost so that participant can continue participation and receive test results. Informed participant they do not need to be fasting and if there are other samples that need to be drawn, they can be done at the same visit. Participant has not had a blood transfusion or blood product in the last 30 days. Participant agreed to provide another sample. Participant was provided the [employer] program website to learn why this may have happened. Participant was thanked for their time and continued support of the above study.    Jordyn Pennstrom, BS [place]  [facility] Clinical Research Specialist II Direct Dial: [phone]  Fax: [phone]

## 2023-12-26 DIAGNOSIS — M542 Cervicalgia: Secondary | ICD-10-CM | POA: Diagnosis not present

## 2023-12-26 NOTE — Telephone Encounter (Signed)
 error

## 2023-12-27 ENCOUNTER — Inpatient Hospital Stay
Admission: RE | Admit: 2023-12-27 | Discharge: 2023-12-27 | Payer: Self-pay | Attending: Medical Genetics | Admitting: Medical Genetics

## 2023-12-27 DIAGNOSIS — Z006 Encounter for examination for normal comparison and control in clinical research program: Secondary | ICD-10-CM | POA: Insufficient documentation

## 2023-12-28 DIAGNOSIS — M542 Cervicalgia: Secondary | ICD-10-CM | POA: Diagnosis not present

## 2024-01-06 LAB — GENECONNECT MOLECULAR SCREEN: Genetic Analysis Overall Interpretation: NEGATIVE

## 2024-01-15 ENCOUNTER — Encounter: Payer: Self-pay | Admitting: Family Medicine

## 2024-01-29 ENCOUNTER — Encounter: Payer: Self-pay | Admitting: Family Medicine

## 2024-01-29 ENCOUNTER — Encounter: Admitting: Family Medicine

## 2024-01-29 VITALS — BP 124/76 | HR 61 | Resp 14 | Ht 68.0 in | Wt 130.1 lb

## 2024-01-29 DIAGNOSIS — R030 Elevated blood-pressure reading, without diagnosis of hypertension: Secondary | ICD-10-CM | POA: Diagnosis not present

## 2024-01-29 DIAGNOSIS — F419 Anxiety disorder, unspecified: Secondary | ICD-10-CM

## 2024-01-29 DIAGNOSIS — I83813 Varicose veins of bilateral lower extremities with pain: Secondary | ICD-10-CM

## 2024-01-29 DIAGNOSIS — E7800 Pure hypercholesterolemia, unspecified: Secondary | ICD-10-CM

## 2024-01-29 DIAGNOSIS — Z0000 Encounter for general adult medical examination without abnormal findings: Secondary | ICD-10-CM | POA: Diagnosis not present

## 2024-01-29 DIAGNOSIS — Z1231 Encounter for screening mammogram for malignant neoplasm of breast: Secondary | ICD-10-CM

## 2024-01-29 DIAGNOSIS — M810 Age-related osteoporosis without current pathological fracture: Secondary | ICD-10-CM

## 2024-01-29 NOTE — Progress Notes (Signed)
 "    Complete physical exam   Patient: Amber Hayes   DOB: 1955/03/14   69 y.o. Female  MRN: [id number] Visit Date: 01/29/2024  Today's healthcare provider: Jon Eva, MD   Chief Complaint  Patient presents with   Annual Exam    Last physical: 01/12/23 citalopram  and Buspar  has been trying to come off meds, slow process   Subjective    Amber Hayes is a 69 y.o. female who presents today for a complete physical exam.   Discussed the use of AI scribe software for clinical note transcription with the patient, who gave verbal consent to proceed.  History of Present Illness   Amber Hayes is a 70 year old female who presents for a routine physical exam and medication review.  She has been tapering citalopram  from 30 mg to 20 mg and tapering buspirone  over the past six months with minimal alternating doses. She feels stable and hopes to eventually discontinue both medications.  She has tingling and numbness in her toes, sometimes radiating up both legs, with a tight band sensation and occasional blue discoloration of the toes. A prior nerve conduction study was normal. She has visible varicose and spider veins.  She has been on Prolia  for two years for low bone density. She had a fall when pulled down by her dog without any fractures.  She takes Prilosec, which controls her gastrointestinal discomfort, though she sometimes feels an abnormal sensation of hunger. She had a colonoscopy in 2020 and has a follow-up GI visit scheduled in April.  She notices strong-smelling urine and thinks it may relate to hydration or vitamins. She tries to drink plenty of water and takes daily vitamins.  She prefers not to receive flu shots.         09/11/2023    3:48 PM 08/10/2023    9:38 AM 04/24/2023    1:11 PM 02/07/2023   10:56 AM  GAD 7 : Generalized Anxiety Score  Nervous, Anxious, on Edge 0 0 0 0  Control/stop worrying 0 0 0 0  Worry too much - different things 0 0 0 0   Trouble relaxing 0 0 0 0  Restless 0 0 0 0  Easily annoyed or irritable 0 0 0 0  Afraid - awful might happen 0 0 0 0  Total GAD 7 Score 0 0 0 0  Anxiety Difficulty Not difficult at all Not difficult at all Not difficult at all Not difficult at all      Last depression screening scores    01/29/2024    9:47 AM 09/12/2023    8:56 AM 09/11/2023    3:48 PM  PHQ 2/9 Scores  PHQ - 2 Score 0 0 0  PHQ- 9 Score  0  1      Data saved with a previous flowsheet row definition   Last fall risk screening    09/12/2023    8:59 AM  Fall Risk   Falls in the past year? 1  Comment DOG PULLED HER DOWN  Number falls in past yr: 0  Injury with Fall? 1   Follow up Falls evaluation completed;Falls prevention discussed     Data saved with a previous flowsheet row definition        Medications: Show/hide medication list[1]  Review of Systems    Objective    BP 124/76   Pulse 61   Resp 14   Ht 5' 8 (1.727 m)   Wt 130 lb 1.6 oz (59  kg)   SpO2 100%   BMI 19.78 kg/m    Physical Exam Vitals reviewed.  Constitutional:      General: She is not in acute distress.    Appearance: Normal appearance. She is well-developed. She is not diaphoretic.  HENT:     Head: Normocephalic and atraumatic.     Right Ear: Tympanic membrane, ear canal and external ear normal.     Left Ear: Tympanic membrane, ear canal and external ear normal.     Nose: Nose normal.     Mouth/Throat:     Mouth: Mucous membranes are moist.     Pharynx: Oropharynx is clear. No oropharyngeal exudate.  Eyes:     General: No scleral icterus.    Conjunctiva/sclera: Conjunctivae normal.     Pupils: Pupils are equal, round, and reactive to light.  Neck:     Thyroid : No thyromegaly.  Cardiovascular:     Rate and Rhythm: Normal rate and regular rhythm.     Heart sounds: Normal heart sounds. No murmur heard. Pulmonary:     Effort: Pulmonary effort is normal. No respiratory distress.     Breath sounds: Normal breath  sounds. No wheezing or rales.  Abdominal:     General: There is no distension.     Palpations: Abdomen is soft.     Tenderness: There is no abdominal tenderness.  Musculoskeletal:        General: No deformity.     Cervical back: Neck supple.     Right lower leg: No edema.     Left lower leg: No edema.  Lymphadenopathy:     Cervical: No cervical adenopathy.  Skin:    General: Skin is warm and dry.     Findings: No rash.  Neurological:     Mental Status: She is alert and oriented to person, place, and time. Mental status is at baseline.     Gait: Gait normal.  Psychiatric:        Mood and Affect: Mood normal.        Behavior: Behavior normal.        Thought Content: Thought content normal.      No results found for any visits on 01/29/24.  Assessment & Plan    Routine Health Maintenance and Physical Exam  Exercise Activities and Dietary recommendations  Goals      DIET - EAT MORE FRUITS AND VEGETABLES     Patient Stated     I want to increase my core and become physically stronger and  stay as mobile for as long as I can         Immunization History  Administered Date(s) Administered   PFIZER(Purple Top)SARS-COV-2 Vaccination 03/29/2019, 04/18/2019, 01/05/2020   Pneumococcal Conjugate-13 12/25/2019   Pneumococcal Polysaccharide-23 11/08/2018   Tdap 07/13/2013   Zoster Recombinant(Shingrix) 02/24/2022, 07/29/2022    Health Maintenance  Topic Date Due   DTaP/Tdap/Td (2 - Td or Tdap) 07/14/2023   COVID-19 Vaccine (4 - 2025-26 season) 09/25/2023   Bone Density Scan  02/17/2024   Influenza Vaccine  04/23/2024 (Originally 08/25/2023)   Mammogram  05/11/2024   Medicare Annual Wellness (AWV)  09/11/2024   Colonoscopy  10/18/2028   Pneumococcal Vaccine: 50+ Years  Completed   Hepatitis C Screening  Completed   Zoster Vaccines- Shingrix  Completed   Meningococcal B Vaccine  Aged Out    Discussed health benefits of physical activity, and encouraged her to engage in  regular exercise appropriate for her age and condition.  Problem List Items Addressed This Visit       Musculoskeletal and Integument   Age-related osteoporosis without current pathological fracture   Osteoporosis managed with Prolia  for two years. Bone density test scheduled for February 11th, 2026. No fractures reported despite a fall. - Continue Prolia  treatment  - f/b Endo - Proceed with scheduled bone density test on February 11th, 2026      Relevant Orders   VITAMIN D  25 Hydroxy (Vit-D Deficiency, Fractures)     Other   Anxiety   Anxiety well-managed with minimal doses of Celexa  and Buspar . She reports feeling as good as ever and is gradually tapering off medications. Engaged in personal development activities such as [place], [place], and twelve-step programs. - Continue gradual tapering of Celexa  and Buspar  - Will reassess medication status in six months      Blood pressure elevated without history of HTN   Well controlled by end of visit      Pure hypercholesterolemia   Cholesterol levels to be monitored with upcoming labs. - Ordered cholesterol panel as part of routine labs      Relevant Orders   Comprehensive metabolic panel with GFR   Lipid panel   Other Visit Diagnoses       Encounter for annual physical exam    -  Primary   Relevant Orders   Comprehensive metabolic panel with GFR   Lipid panel   VITAMIN D  25 Hydroxy (Vit-D Deficiency, Fractures)     Breast cancer screening by mammogram       Relevant Orders   MM 3D SCREENING MAMMOGRAM BILATERAL BREAST W/IMPLANT     Varicose veins of both lower extremities with pain       Relevant Orders   Ambulatory referral to Vascular Surgery           Adult Wellness Visit Routine wellness visit with no significant changes in health status. Blood pressure is well-controlled at 94/76 mmHg. Labs are due for routine monitoring. - Rechecked blood pressure before leaving - Ordered labs including vitamin D ,  cholesterol, kidney and liver function, and calcium - Sent mammogram order to [facility] for April - Advised to get tetanus shot at pharmacy and send record to office  Varicose veins of bilateral lower extremities with pain Reports tingling and numbness in toes and legs, with visible varicose veins. Previous nerve conduction study showed no neuropathy. Symptoms may be related to venous insufficiency rather than neuropathy. - Referred to [facility] for evaluation - Advised wearing compression socks during the day for at least three months before vascular evaluation       Return in about 6 months (around 07/28/2024) for chronic disease f/u.     Jon Eva, MD  [facility] [phone] (phone) [phone] (fax)  [facility]      [1]  Outpatient Medications Prior to Visit  Medication Sig   busPIRone  (BUSPAR ) 7.5 MG tablet Take 1 tablet by mouth twice daily   citalopram  (CELEXA ) 20 MG tablet TAKE 1 & 1/2 (ONE & ONE-HALF) TABLETS BY MOUTH ONCE DAILY   denosumab  (PROLIA ) 60 MG/ML SOSY injection Inject 60 mg into the skin every 6 (six) months.   Iron, Ferrous Sulfate, 325 (65 Fe) MG TABS Take by mouth.   omeprazole  (PRILOSEC) 40 MG capsule Take 1 capsule by mouth once daily   tretinoin  (RETIN-A ) 0.05 % cream Apply pea-sized amount to face at bedtime, wash off in morning.   No facility-administered medications prior  to visit.   "

## 2024-01-29 NOTE — Assessment & Plan Note (Signed)
 Well controlled by end of visit

## 2024-01-29 NOTE — Assessment & Plan Note (Signed)
 Anxiety well-managed with minimal doses of Celexa  and Buspar . She reports feeling as good as ever and is gradually tapering off medications. Engaged in personal development activities such as [place], [place], and twelve-step programs. - Continue gradual tapering of Celexa  and Buspar  - Will reassess medication status in six months

## 2024-01-29 NOTE — Assessment & Plan Note (Signed)
 Osteoporosis managed with Prolia  for two years. Bone density test scheduled for February 11th, 2026. No fractures reported despite a fall. - Continue Prolia  treatment  - f/b Endo - Proceed with scheduled bone density test on February 11th, 2026

## 2024-01-29 NOTE — Patient Instructions (Signed)
 Call [facility] to schedule a mammogram [phone]   Consider asking at the pharmacy about the tetanus shot (TDAP)

## 2024-01-29 NOTE — Assessment & Plan Note (Signed)
 Cholesterol levels to be monitored with upcoming labs. - Ordered cholesterol panel as part of routine labs

## 2024-01-30 LAB — COMPREHENSIVE METABOLIC PANEL WITH GFR
ALT: 19 IU/L (ref 0–32)
AST: 29 IU/L (ref 0–40)
Albumin: 4.1 g/dL (ref 3.9–4.9)
Alkaline Phosphatase: 105 IU/L (ref 49–135)
BUN/Creatinine Ratio: 16 (ref 12–28)
BUN: 14 mg/dL (ref 8–27)
Bilirubin Total: 0.3 mg/dL (ref 0.0–1.2)
CO2: 27 mmol/L (ref 20–29)
Calcium: 9.5 mg/dL (ref 8.7–10.3)
Chloride: 103 mmol/L (ref 96–106)
Creatinine, Ser: 0.85 mg/dL (ref 0.57–1.00)
Globulin, Total: 2.1 g/dL (ref 1.5–4.5)
Glucose: 88 mg/dL (ref 70–99)
Potassium: 4.1 mmol/L (ref 3.5–5.2)
Sodium: 143 mmol/L (ref 134–144)
Total Protein: 6.2 g/dL (ref 6.0–8.5)
eGFR: 74 mL/min/1.73

## 2024-01-30 LAB — LIPID PANEL
Chol/HDL Ratio: 3.2 ratio (ref 0.0–4.4)
Cholesterol, Total: 212 mg/dL — ABNORMAL HIGH (ref 100–199)
HDL: 66 mg/dL
LDL Chol Calc (NIH): 123 mg/dL — ABNORMAL HIGH (ref 0–99)
Triglycerides: 130 mg/dL (ref 0–149)
VLDL Cholesterol Cal: 23 mg/dL (ref 5–40)

## 2024-01-30 LAB — VITAMIN D 25 HYDROXY (VIT D DEFICIENCY, FRACTURES): Vit D, 25-Hydroxy: 59 ng/mL (ref 30.0–100.0)

## 2024-01-31 ENCOUNTER — Ambulatory Visit: Payer: Self-pay | Admitting: Family Medicine

## 2024-02-20 ENCOUNTER — Other Ambulatory Visit (INDEPENDENT_AMBULATORY_CARE_PROVIDER_SITE_OTHER): Payer: Self-pay | Admitting: Nurse Practitioner

## 2024-02-20 DIAGNOSIS — I83813 Varicose veins of bilateral lower extremities with pain: Secondary | ICD-10-CM

## 2024-02-22 ENCOUNTER — Encounter (INDEPENDENT_AMBULATORY_CARE_PROVIDER_SITE_OTHER): Payer: Self-pay | Admitting: Nurse Practitioner

## 2024-02-22 ENCOUNTER — Ambulatory Visit (INDEPENDENT_AMBULATORY_CARE_PROVIDER_SITE_OTHER): Admitting: Nurse Practitioner

## 2024-02-22 ENCOUNTER — Other Ambulatory Visit (INDEPENDENT_AMBULATORY_CARE_PROVIDER_SITE_OTHER)

## 2024-02-22 VITALS — BP 142/90 | HR 61 | Resp 18 | Ht 68.0 in | Wt 130.6 lb

## 2024-02-22 DIAGNOSIS — R0989 Other specified symptoms and signs involving the circulatory and respiratory systems: Secondary | ICD-10-CM | POA: Diagnosis not present

## 2024-02-22 DIAGNOSIS — I83813 Varicose veins of bilateral lower extremities with pain: Secondary | ICD-10-CM

## 2024-02-22 DIAGNOSIS — E7800 Pure hypercholesterolemia, unspecified: Secondary | ICD-10-CM | POA: Diagnosis not present

## 2024-02-22 DIAGNOSIS — R200 Anesthesia of skin: Secondary | ICD-10-CM

## 2024-02-22 DIAGNOSIS — R202 Paresthesia of skin: Secondary | ICD-10-CM

## 2024-02-23 ENCOUNTER — Encounter (INDEPENDENT_AMBULATORY_CARE_PROVIDER_SITE_OTHER): Payer: Self-pay | Admitting: Nurse Practitioner

## 2024-02-23 ENCOUNTER — Ambulatory Visit (INDEPENDENT_AMBULATORY_CARE_PROVIDER_SITE_OTHER)

## 2024-02-23 DIAGNOSIS — R200 Anesthesia of skin: Secondary | ICD-10-CM | POA: Diagnosis not present

## 2024-02-23 DIAGNOSIS — R202 Paresthesia of skin: Secondary | ICD-10-CM | POA: Diagnosis not present

## 2024-02-23 NOTE — Progress Notes (Signed)
 "  Subjective:    Patient ID: Amber Hayes, female    DOB: [birth year], 69 y.o.   MRN: [id number] Chief Complaint  Patient presents with   New Patient (Initial Visit)    Ref [facility] consult bilateral le vv with pain    HPI  Discussed the use of AI scribe software for clinical note transcription with the patient, who gave verbal consent to proceed.  History of Present Illness Amber Hayes Geannie is a 69 year old female with chronic bilateral lower extremity paresthesia who presents for vascular evaluation of persistent tingling and discoloration of both feet.  For the past two years, she has experienced persistent paresthesia in both feet and toes, described as a constant tingling sensation. The symptoms have progressively worsened, now extending up her legs and accompanied by a sensation of tightness. She denies numbness, calf pain, cramping, or activity limitation, and remains physically active with regular walking. There is no lower extremity edema.  She reports chronic cold intolerance of her feet, sometimes requiring socks at night. During episodes of cold, her feet may appear purplish, and she suspects the tingling may worsen, though she is uncertain. She experiences nocturnal discomfort in her feet, often moving them due to the uncomfortable sensation, but does not believe she moves them while asleep. There is no change in symptoms with ambulation or activity.  She has a history of spider veins and was referred for evaluation due to concern for varicose veins. She reports that prior venous duplex studies did not show any abnormalities, and she was told there was no evidence of venous reflux, venous insufficiency, or thrombosis. She reports that nerve conduction studies did not show evidence of neuropathy. She has not undergone a biopsy for small fiber neuropathy. She has a stable lesion on her toe previously evaluated by dermatology.  She also has chronic lower back and neck pain,  which has improved with physical therapy. She is not currently taking antihypertensive medications and monitors her blood pressure at home.    Results Diagnostic Lower extremity venous duplex ultrasound (02/22/2024): No venous reflux, no deep venous insufficiency, no superficial venous insufficiency, no thrombus, normal deep and superficial veins bilaterally.   Review of Systems  Cardiovascular:  Negative for leg swelling.  Neurological:  Positive for numbness.  All other systems reviewed and are negative.      Objective:   Physical Exam Vitals reviewed.  HENT:     Head: Normocephalic.  Cardiovascular:     Pulses:          Dorsalis pedis pulses are 2+ on the right side and 0 on the left side.       Posterior tibial pulses are 1+ on the right side and 1+ on the left side.  Musculoskeletal:     Right lower leg: No edema.     Left lower leg: No edema.  Neurological:     Mental Status: She is alert and oriented to person, place, and time.  Psychiatric:        Mood and Affect: Mood normal.        Behavior: Behavior normal.        Thought Content: Thought content normal.        Judgment: Judgment normal.     Physical Exam CARDIOVASCULAR: Strong pulse. Diminished pulse in left foot. SKIN: Purplish discoloration of feet.  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    Employer: [employer]    Comment: Part time; seasonal  Tobacco Use   Smoking status:  Former    Current packs/day: 0.00    Average packs/day: 0.5 packs/day for 15.0 years (7.5 ttl pk-yrs)    Types: Cigarettes    Start date: 01/23/1989    Quit date: 01/24/2004    Years since quitting: 20.0   Smokeless tobacco: Never  Vaping Use   Vaping status: Never Used  Substance and Sexual Activity   Alcohol use: Never   Drug use: Never   Sexual activity: Yes    Birth control/protection: Post-menopausal  Other Topics Concern   Not on file  Social History Narrative   Not on file   Social Drivers of Health   Tobacco Use: Medium Risk (02/23/2024)   Patient History    Smoking Tobacco Use: Former    Smokeless Tobacco Use: Never    Passive Exposure: Not on Actuary Strain: Low Risk (01/25/2024)   Overall Financial Resource Strain (CARDIA)    Difficulty of Paying Living Expenses: Not hard at all  Food Insecurity: No Food Insecurity (01/25/2024)   Epic    Worried About Radiation Protection Practitioner of Food in the Last Year: Never true    Ran Out of Food in the Last Year: Never true  Transportation Needs: No Transportation Needs (01/25/2024)   Epic    Lack of Transportation (Medical): No    Lack of Transportation (Non-Medical): No  Physical Activity: Sufficiently Active (01/25/2024)   Exercise Vital Sign    Days of Exercise per Week: 6 days    Minutes of Exercise per Session: 60 min  Stress: No Stress Concern Present (01/25/2024)   [employer] of Occupational Health - Occupational Stress Questionnaire    Feeling of Stress: Not at all  Social Connections: Socially Integrated (01/25/2024)   Social Connection and Isolation Panel    Frequency of Communication with Friends and Family: More than three times a week    Frequency of Social Gatherings with Friends and Family: Three times a week    Attends Religious Services: More than 4 times per year    Active Member of Clubs or Organizations: Yes    Attends Banker Meetings: More than 4 times per year    Marital Status:  Married  Catering Manager Violence: Not At Risk (09/12/2023)   Epic    Fear of Current or Ex-Partner: No    Emotionally Abused: No    Physically Abused: No    Sexually Abused: No  Depression (PHQ2-9): Low Risk (01/29/2024)   Depression (PHQ2-9)    PHQ-2 Score: 0  Alcohol Screen: Low Risk (09/12/2023)   Alcohol Screen    Last Alcohol Screening Score (AUDIT): 0  Housing: Low Risk (01/29/2024)   Epic    Unable to Pay for Housing in the Last Year: No    Number of Times Moved in the Last Year: 0  Homeless in the Last Year: No  Utilities: Not At Risk (01/29/2024)   Epic    Threatened with loss of utilities: No  Health Literacy: Adequate Health Literacy (09/12/2023)   B1300 Health Literacy    Frequency of need for help with medical instructions: Never    Past Surgical History:  Procedure Laterality Date   AUGMENTATION MAMMAPLASTY Bilateral 2003   CATARACT EXTRACTION W/PHACO Right 02/13/2019   Procedure: CATARACT EXTRACTION PHACO AND INTRAOCULAR LENS PLACEMENT (IOC) RIGHT 3.81 00:38.5 9.9%;  Surgeon: Mittie Gaskin, MD;  Location: [facility];  Service: Ophthalmology;  Laterality: Right;   CATARACT EXTRACTION W/PHACO Left 04/10/2019   Procedure: CATARACT EXTRACTION PHACO AND INTRAOCULAR LENS PLACEMENT (IOC) LEFT 2.02 00:27.3 7.4%;  Surgeon: Mittie Gaskin, MD;  Location: [facility];  Service: Ophthalmology;  Laterality: Left;   COLONOSCOPY WITH PROPOFOL  N/A 10/19/2018   Procedure: COLONOSCOPY WITH PROPOFOL ;  Surgeon: Therisa Bi, MD;  Location: [facility];  Service: Gastroenterology;  Laterality: N/A;   COSMETIC SURGERY  1998   Breast implants   EYE SURGERY  1999   Lasik   EYE SURGERY  2017   Torn Retina    Family History  Problem Relation Age of Onset   Alzheimer's disease Mother    Miscarriages / Stillbirths Mother    Heart attack Father    Alcohol abuse Father    Diabetes Father    Heart disease Father    Hypertension Sister    Bronchitis  Sister    Neurologic Disorder Sister        global transient amnesia x 2   Breast cancer Other     Allergies[1]     Latest Ref Rng & Units 01/12/2023    2:35 PM 11/09/2021   11:49 AM 09/15/2021   10:19 AM  CBC  WBC 3.4 - 10.8 x10E3/uL 6.5  6.7  5.9   Hemoglobin 11.1 - 15.9 g/dL 85.6  86.5  88.6   Hematocrit 34.0 - 46.6 % 43.5  41.7  36.4   Platelets 150 - 450 x10E3/uL 213  181.0  239.0       CMP     Component Value Date/Time   NA 143 01/29/2024 1023   K 4.1 01/29/2024 1023   CL 103 01/29/2024 1023   CO2 27 01/29/2024 1023   GLUCOSE 88 01/29/2024 1023   GLUCOSE 85 09/15/2021 1019   BUN 14 01/29/2024 1023   CREATININE 0.85 01/29/2024 1023   CALCIUM 9.5 01/29/2024 1023   PROT 6.2 01/29/2024 1023   ALBUMIN 4.1 01/29/2024 1023   AST 29 01/29/2024 1023   ALT 19 01/29/2024 1023   ALKPHOS 105 01/29/2024 1023   BILITOT 0.3 01/29/2024 1023   GFR 84.68 09/15/2021 1019   EGFR 74 01/29/2024 1023   GFRNONAA 73 11/13/2018 0808     No results found.     Assessment & Plan:   1. Numbness and tingling of both feet (Primary) Numbness and tingling of both feet Chronic, progressive bilateral foot and toe paresthesias with tightness and episodic discoloration. Differential includes arterial insufficiency, small vessel disease, small fiber neuropathy, and lumbar nerve impingement. Previous venous and nerve conduction studies were normal. Further vascular evaluation indicated. - Recommended further vascular evaluation with ABI and toe waveform studies. - Discussed possible neurology referral for small fiber neuropathy diagnosis via skin biopsy if vascular studies are normal. - VAS US  ABI WITH/WO TBI; Future  2. Pure hypercholesterolemia Continue statin as ordered and reviewed, no changes at this time  3. Decreased  pulsePeripheral arterial disease, suspected Suspected due to diminished left foot pulse and episodic purplish discoloration of the toes, especially with cold exposure.  No claudication. Venous studies were normal. Evaluation for arterial insufficiency or small vessel disease warranted. - Ordered ABI and toe waveform studies to evaluate for arterial insufficiency and rule out peripheral arterial disease. - Discussed that if large vessel occlusion is identified, angiogram and possible intervention may be considered. - Discussed possible trial of medical therapy if small vessel disease is suspected. - Plan to refer back to primary provider or consider neurology referral if vascular studies are normal.   Medications Ordered Prior to Encounter[2]  There are no Patient Instructions on file for this visit. Return in about 1 month (around 03/23/2024) for Pt conv ABI GS/FB.   Johndaniel Catlin E Keionna Kinnaird, NP      [1] No Known Allergies [2]  Current Outpatient Medications on File Prior to Visit  Medication Sig Dispense Refill   denosumab  (PROLIA ) 60 MG/ML SOSY injection Inject 60 mg into the skin every 6 (six) months.     Iron, Ferrous Sulfate, 325 (65 Fe) MG TABS Take by mouth.     tretinoin  (RETIN-A ) 0.05 % cream Apply pea-sized amount to face at bedtime, wash off in morning. 20 g 11   busPIRone  (BUSPAR ) 7.5 MG tablet Take 1 tablet by mouth twice daily 180 tablet 1   citalopram  (CELEXA ) 20 MG tablet TAKE 1 & 1/2 (ONE & ONE-HALF) TABLETS BY MOUTH ONCE DAILY 132 tablet 0   omeprazole  (PRILOSEC) 40 MG capsule Take 1 capsule by mouth once daily 30 capsule 0   No current facility-administered medications on file prior to visit.   "

## 2024-02-29 ENCOUNTER — Ambulatory Visit

## 2024-02-29 ENCOUNTER — Ambulatory Visit (INDEPENDENT_AMBULATORY_CARE_PROVIDER_SITE_OTHER): Admitting: Vascular Surgery

## 2024-02-29 DIAGNOSIS — L509 Urticaria, unspecified: Secondary | ICD-10-CM

## 2024-02-29 MED ORDER — TRIAMCINOLONE ACETONIDE 0.1 % EX OINT: TOPICAL_OINTMENT | CUTANEOUS | 3 refills | Status: AC

## 2024-02-29 NOTE — Progress Notes (Signed)
" °  °  Subjective   Amber Hayes is a 69 y.o. female who presents for the following: Lesion(s) of concern . Patient is established patient   Today patient reports: Patient states LOC on back, itchy scaly and scab. Appears and disappears.  Review of Systems:    No other skin or systemic complaints except as noted in HPI or Assessment and Plan.  The following portions of the chart were reviewed this encounter and updated as appropriate: medications, allergies, medical history  Relevant Medical History:  n/a   Objective  (SKPE) Well appearing patient in no apparent distress; mood and affect are within normal limits. 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 Condition is symptomatic and bothersome to patient. Patient is flaring and not currently at treatment goal.  - Explained the process of cholinergic urticaria that can be associated with overheating, overstimulation - Discussed can take zyrtec 10 mg prn for flares   start triamcinolone  ointment 0.1% twice daily to affected areas when flared. Discussed side effect of potent topical steroids including atrophy, dyspigmentation, striae, telangectasia, folliculitis, loss of skin pigment, hair growth, tachyphylaxis, risk of systemic absorption with missuse.  Facial Elastosis  -Discussed Botox with patient. - Recommend Dr. Beverley Czar Cox in [place]. Information given to patient.  Was sun protection counseling provided?: Yes   Level of service outlined above   Patient instructions (SKPI)   Procedures, orders, diagnosis for this visit:  URTICARIA    Urticaria  Other orders -     Triamcinolone  Acetonide; Apply 1 gram twice  daily to affected areas of skin when flared. Avoid use on face, armpits, groin unless otherwise indicated.  Dispense: 30 g; Refill: 3    Return to clinic: No follow-ups on file.  I, Almetta Nora, RMA, am acting as scribe for Lauraine JAYSON Kanaris, MD .   Documentation: I have reviewed the above documentation for accuracy and completeness, and I agree with the above.  Lauraine JAYSON Kanaris, MD  "

## 2024-02-29 NOTE — Patient Instructions (Addendum)
 Skin Care Recommendations   In the morning: - Cleanse face with a gentle cleanser   - Apply an gentle moisturizer - Apply a sunscreen   Optional add ins: Vitamin C, hyaluronic acid, peptides  In the evening: - Cleanse face with a regular gentle face wash - Wait for skin to completely dry - Use a topical retinoid followed by a moisturizer   Optional add ins: hyaluronic acid, peptides, eye creams   Apply your products from thinnest to thickest -- watery serums first, then creams, then oils, and sunscreen last in the morning. This helps everything absorb properly and gives your skin the best results  Sunscreen should always be your final step in the morning -- Tips for Use: - Aim for SPF >30  - Apply every morning, even on cloudy days. - Use ~ teaspoon for face; reapply every 2 hours outdoors. This is about the length of two fingertips.  - Squeeze out two fingertip units of cream -- one for the face, one for the neck. This is about  teaspoon, which ensures full coverage. - Tinted options help protect against visible light, important for pigmentation concerns. - Lightweight, oil-free options are preferred for acne-prone or oily skin.  Gentle Cleanser (Morning & Night)  A good cleanser removes dirt, oil, and makeup without stripping your skins natural barrier. Look for gentle, fragrance-free formulas. Recommend products like Cetaphil, CeraVe, La Roche-Posay, Neutrogena, Avene.  Cetaphil Gentle Skin Cleanser - classic sensitive skin favorite (~$6-$12) CeraVe Hydrating Facial Cleanser - hydrating with ceramides (~$6-$11) La Roche Posay Toleriane Hydrating Gentle Cleanser - creamy soothing option (~$14-$16) Neutrogena Ultra Gentle Daily Cleanser - very gentle option (~$9-$11) Vanicream Gentle Facial Cleanser - great budget sensitive choice (~$9-$13) Avene Cleanance HYRA Cleanser ($20-30)   Tips: Massage gently with lukewarm water, rinse well, and pat dry before moisturizing.  Gentle  Moisturizer (Morning & Night)  Hydrating moisturizers help support your skins barrier and reduce dryness or irritation.Look for gentle, fragrance-free, non comedogenic formulas.  Neutrogena Hydro Boost Water Gel - lightweight hydrating gel (~$19) Neutrogena Hydro Boost Water Cream - richer hydration (~$19-$25) Avne Tolerance Hydra-10 Hydrating Cream - very sensitive skin (+48h hydration) (~$26-$28) Avne Tolerance Control Soothing Skin Recovery Balm - soothing balm for irritated/dry skin (~$38) Avne Cicalfate+ Restorative Protective Cream - excellent for barrier support (~$26)  Sunscreen (EVERY morning)  Sunscreens protect against UV and visible light, helping prevent sunburn, photoaging, and pigmentation issues. Choose formulations based on skin type, sensitivity, and cosmetic preferences  EltaMD UV Daily SPF 40 - lightweight lotion with antioxidants and hyaluronic acid; good for normal to dry, sensitive skin (~$35-$40)  EltaMD UV Clear SPF 46 - lightweight, oil-free fluid with niacinamide; ideal for acne-prone, rosacea, or sensitive skin (~$36-$40)  EltaMD Tinted UV SPF 46 - tinted lotion with iron oxides; protects against visible light, excellent for hyperpigmentation and darker skin tones (~$36-$40)  EltaMD Recovery SPF 30 - soothing, antioxidant-rich lotion; best for dry, reactive, or post-procedure skin (~$38)  La Roche-Posay Anthelios SPF 60 - fast-absorbing, broad-spectrum UVA/UVB protection; suitable for all skin types, including sensitive or sun-sensitive (~$36-$40)  Eucerin Tinted Sunscreen SPF 50 - tinted cream with visible light protection; moisturizing, great for melasma or pigmentation-prone skin (~$16-$20)  TiZO Tinted SPF 40 (or SPF 40+ / SPF 100) - mineral, iron-oxide-rich tint; excellent for visible light protection & melasma with broad spectrum (~$30-$45)  ISDIN Eryfotona Actinica SPF 50+ - mineral sunscreen with DNA repair enzymes; good for photo-damaged or high-sun-exposure  skin (~$39-$45)  ISDIN Fusion Water SPF 50+ - light, water-like texture, great under makeup and for oily/combination skin (~$32-$38)  Neutrogena Ultra Sheer Dry-Touch SPF 55 - non-greasy, lightweight lotion with strong SPF, great for everyday use (~$10-$15)   Topical Retinoids  Topical retinoids help improve acne, fine lines, hyperpigmentation, and overall skin texture. Start slowly to reduce irritation and always use sunscreen during the day. There are over the counter and prescription options, prescription options tend to be more effective.   CeraVe Resurfacing Retinol Serum (~$18-$22) - gentle, ceramide + niacinamide formula great for beginners and sensitive skin  La Roche-Posay Retinol B3 Serum (~$44-$47) - dermatologist-trusted anti-aging serum with niacinamide for soothing and tone  Avene Retrinal 0.1% Intensive Multi-Corrective Cream - potent retinaldehyde (a step up from retinol) cream for wrinkles, smoothness & radiance (~$52-$68)  Avene [place]   Vitamin C Vitamin?C (especially in stable forms like L-ascorbic acid or derivatives) helps brighten dull skin, reduce dark spots, protect against free radicals, and support collagen health. CeraVe Skin Renewing Serum - 10% vitamin?C with ceramides & hyaluronic acid; gentle and hydrating, excellent first serum (~$18-$22). La Roche-Posay 10% Pure Vitamin C Serum - dermatologist-trusted brand with 10% pure vitamin?C; brightens & smooths (~$35-$40). Naturium Vitamin C Complex Serum - blend of vitamin?C forms + humectants; balanced and less irritating. SkinCeuticals C E Ferulic - gold-standard antioxidant with 15% L-ascorbic acid + vitamin E + ferulic acid; excellent anti-aging & radiance protection (~$140-$185).  SkinCeuticals Phloretin CF - vitamin C + phloretin + ferulic acid; great for discoloration & uneven tone (~$185). Alastin C-Radical Defense Antioxidant Serum - stable vitamin C (sodium ascorbate)-based antioxidant; protects and supports radiance  with a silky texture (~$150-$200) RoC Multi Correxion Revive + Glow Daily Serum - 10% vitamin C + peptides for brightening, firmness & glow Vichy Liftactiv Vitamin C Serum - 15% pure vitamin C + hyaluronic acid; radiance & fine line support (~$30-$40) Avne Vitamin Activ Cg Serum - gentle vitamin C derivative + antioxidants; good for sensitive or reactive skin  Hyaluronic Acid  Hyaluronic acid (HA) is a humectant that draws moisture into skin and helps it stay plump and hydrated. Apply AM &/or PM after cleansing and before moisturizer. CeraVe Hydrating Hyaluronic Acid Serum - With ceramides & HA; supports skin barrier + hydration (~$18-$22). Neutrogena Hydro Boost Hyaluronic Acid Serum - Gel-like texture with high & low-weight HA; great for every skin type (~$20-$30). L'Oral Revitalift Pure Hyaluronic Acid Serum - Affordable 1.5% HA blend for plumping + smoothness (~$13-$30). SkinCeuticals Hydrating B5 Gel (Hyaluronic Acid Intensifier) - potent HA serum with supportive glycerin and vitamin B5; excellent for dry/mature skin (~$120) La Roche-Posay Hyalu B5 Pure Hyaluronic Acid Serum - pure HA with vitamin B5 & madecassoside; sensitive skin-friendly, anti-aging hydration (~$40-$55)  Vichy Minral 89 Hyaluronic Acid Face Serum - water-gel booster with volcanic water & HA; great for every skin type and under makeup (~$30-$40)  Peptides Peptides are chains of amino acids that help support collagen and elastin production, hydration, and skin resilience. The Ordinary Multi-Peptide + HA Serum - Broad peptide blend with hyaluronic acid; targets multiple signs of aging (~$20-$35). COSRX The 6 Peptide Skin Booster Serum - 6 peptide types to help texture, firmness & hydration (~$20-$30) SkinCeuticals P-TIOX Serum - Peptide-plus synergy serum with PHA and niacinamide for expression lines & radiance ($185) Good Molecules Super Peptide Serum - Copper peptides + multi-peptide support for hydration & smoothness  (~$12-$18). ZO Skin Health Peptide Facial Refining Concentrate - advanced peptide blend targeting expression + static wrinkles, facial  contour, elasticity, and volume ($265)   Eye Creams  Eye creams are formulated for the delicate under-eye area and can help with hydration, support collagen/turnover (retinol/retinal), and address dark circles or puffiness. Start slowly with retinoids (1-3/week at night) and always use AM SPF.  SkinBetter EyeMax AlphaRet Overnight Cream - combines retinol + AHA for fine lines, texture, wrinkles, and tone (~$125; night use)   RoC Retinol Correxion Line Smoothing Eye Cream - dermatologist-favorite drugstore retinol eye cream for fine lines, dark circles & puffiness (~$30)   La Roche-Posay Redermic Retinol Eye Cream - gentle pure retinol under-eye cream for lines & texture (~$45-$55)  La Roche-Posay Pigmentclar Eyes - caffeine + brightening ingredients for dark circles & puffiness (~$30-$40)   Neutrogena Hydro Boost Under Eye Gel-Cream - hyaluronic acid for deep hydration, great under makeup (~$18-$30)   CeraVe Eye Repair Cream - ceramides + hyaluronic acid; gentle all-purpose support  Other popular skincare ingredients: Azaleic Acid: Helps with acne, rosacea, and pigmentation; gentle enough for sensitive skin and fights redness and dark marks. Its a dermatologist-favorite for multi-purpose brightening and clarity. Bakuchiol: A plant-derived retinol alternative that triggers similar collagen-boosting effects as retinol but with less irritation, especially good for sensitive or retinoid-intolerant skin Tranxemic acid: A gentle acid that targets hyperpigmentation and melasma, now favored for brightening without irritation.  Over the counter acne treatments:   CeraVe SA Cleanser - salicylic acid face cleanser with ceramides; gentle daily use for bumpy, rough, acne-prone skin  La Roche-Posay Effaclar Salicylic Acid Acne Treatment Serum - 1.5% BHA serum that targets  acne blemishes, pores, and post-acne marks with a triple acid complex (salicylic + glycolic + LHA) and soothing thermal water; start once daily then increase as tolerated.  The Ordinary Mandelic Acid 10% + HA - classic 10% mandelic acid for gentle exfoliation, smooth texture, and brightness  Naturium Mandelic Topical Acid 12% - mandelic acid + fruit AHAs with niacinamide for dark spots & texture  Lip Products for dryness  Plain vaseline or aquaphor  Cortibalm - good for very painful, cracked, chapped lips. Has low dose of steroid which helps with irritation Clinique moisture surge lip balm  Lipsmart Ultrahydrating lip treatment  LaRoche Posay cicaplast lip balm   Lip products with SPF Tizo tinted lip protection EltaMD UV Lipbalm    Recommend [facility] Beverley Leeroy Free, M.D. [address] [place], [place] [address] [phone]  [email or web address]  Due to recent changes in healthcare laws, you may see results of your pathology and/or laboratory studies on MyChart before the doctors have had a chance to review them. We understand that in some cases there may be results that are confusing or concerning to you. Please understand that not all results are received at the same time and often the doctors may need to interpret multiple results in order to provide you with the best plan of care or course of treatment. Therefore, we ask that you please give us  2 business days to thoroughly review all your results before contacting the office for clarification. Should we see a critical lab result, you will be contacted sooner.   If You Need Anything After Your Visit  If you have any questions or concerns for your doctor, please call our main line at [phone] and press option 4 to reach your doctor's medical assistant. If no one answers, please leave a voicemail as directed and we will return your call as soon as possible. Messages left after 4 pm will be answered the  following business day.   You may also send us  a message via MyChart. We typically respond to MyChart messages within 1-2 business days.  For prescription refills, please ask your pharmacy to contact our office. Our fax number is [phone].  If you have an urgent issue when the clinic is closed that cannot wait until the next business day, you can page your doctor at the number below.    Please note that while we do our best to be available for urgent issues outside of office hours, we are not available 24/7.   If you have an urgent issue and are unable to reach us , you may choose to seek medical care at your doctor's office, retail clinic, urgent care center, or emergency room.  If you have a medical emergency, please immediately call 911 or go to the emergency department.  Pager Numbers  - Dr. Hester: [phone]  - Dr. Jackquline: [phone]  - Dr. Claudene: [phone]   - Dr. Raymund: [phone]  In the event of inclement weather, please call our main line at [phone] for an update on the status of any delays or closures.  Dermatology Medication Tips: Please keep the boxes that topical medications come in in order to help keep track of the instructions about where and how to use these. Pharmacies typically print the medication instructions only on the boxes and not directly on the medication tubes.   If your medication is too expensive, please contact our office at [phone] option 4 or send us  a message through MyChart.   We are unable to tell what your co-pay for medications will be in advance as this is different depending on your insurance coverage. However, we may be able to find a substitute medication at lower cost or fill out paperwork to get insurance to cover a needed medication.   If a prior authorization is required to get your medication covered by your insurance company, please allow us  1-2 business days to complete this process.  Drug prices often vary  depending on where the prescription is filled and some pharmacies may offer cheaper prices.  The website www.goodrx.com contains coupons for medications through different pharmacies. The prices here do not account for what the cost may be with help from insurance (it may be cheaper with your insurance), but the website can give you the price if you did not use any insurance.  - You can print the associated coupon and take it with your prescription to the pharmacy.  - You may also stop by our office during regular business hours and pick up a GoodRx coupon card.  - If you need your prescription sent electronically to a different pharmacy, notify our office through [facility] or by phone at [phone] option 4.     Si Usted Necesita Algo Despus de Su Visita  Tambin puede enviarnos un mensaje a travs de Clinical Cytogeneticist. Por lo general respondemos a los mensajes de MyChart en el transcurso de 1 a 2 das hbiles.  Para renovar recetas, por favor pida a su farmacia que se ponga en contacto con nuestra oficina. Randi lakes de fax es [place] [phone].  Si tiene un asunto urgente cuando la clnica est cerrada y que no puede esperar hasta el siguiente da hbil, puede llamar/localizar a su doctor(a) al nmero que aparece a continuacin.   Por favor, tenga en cuenta que aunque hacemos todo lo posible para estar disponibles para asuntos urgentes fuera del horario de oficina, no estamos disponibles las 24  horas del futures trader, los 7 [address]  [address] de la [place].   Si tiene un problema urgente y no puede comunicarse con nosotros, puede optar por buscar atencin mdica  en el consultorio de su doctor(a), en una clnica privada, en un centro de atencin urgente o en una sala de emergencias.  Si tiene engineer, drilling, por favor llame inmediatamente al 911 o vaya a la sala de emergencias.  Nmeros de bper  - Dr. Hester: [phone]  - Dra. Jackquline: [phone]  - Dr. Claudene: [phone]  - Dra.  Kitts: [phone]  En caso de inclemencias del [place], por favor llame a nuestra lnea principal al [phone] para una actualizacin sobre el estado de cualquier retraso o cierre.  Consejos para la medicacin en dermatologa: Por favor, guarde las cajas en las que vienen los medicamentos de uso tpico para ayudarle a seguir las instrucciones sobre dnde y cmo usarlos. Las farmacias generalmente imprimen las instrucciones del medicamento slo en las cajas y no directamente en los tubos del [place].   Si su medicamento es muy caro, por favor, pngase en contacto con landry rieger llamando al [phone] y presione la opcin 4 o envenos un mensaje a travs de Clinical Cytogeneticist.   No podemos decirle cul ser su copago por los medicamentos por adelantado ya que esto es diferente dependiendo de la cobertura de su seguro. Sin embargo, es posible que podamos encontrar un medicamento sustituto a audiological scientist un formulario para que el seguro cubra el medicamento que se considera necesario.   Si se requiere una autorizacin previa para que su compaa de seguros cubra su medicamento, por favor permtanos de 1 a 2 das hbiles para completar este proceso.  Los precios de los medicamentos varan con frecuencia dependiendo del environmental consultant de dnde se surte la receta y alguna farmacias pueden ofrecer precios ms baratos.  El sitio web www.goodrx.com tiene cupones para medicamentos de health and safety inspector. Los precios aqu no tienen en cuenta lo que podra costar con la ayuda del seguro (puede ser ms barato con su seguro), pero el sitio web puede darle el precio si no utiliz tourist information centre manager.  - Puede imprimir el cupn correspondiente y llevarlo con su receta a la farmacia.  - Tambin puede pasar por nuestra oficina durante el horario de atencin regular y education officer, museum una tarjeta de cupones de GoodRx.  - Si necesita que su receta se enve electrnicamente a una farmacia diferente, informe a nuestra oficina a  travs de MyChart de [place] o por telfono llamando al [phone] y presione la opcin 4.

## 2024-07-23 ENCOUNTER — Encounter: Admitting: Dermatology

## 2024-08-01 ENCOUNTER — Ambulatory Visit: Admitting: Family Medicine

## 2024-09-17 ENCOUNTER — Ambulatory Visit
